# Patient Record
Sex: Male | Born: 1945 | Race: White | Hispanic: No | Marital: Married | Smoking: Former smoker
Health system: Southern US, Community
[De-identification: ages and names within clinical notes are randomized; demographics above are authoritative.]

## PROBLEM LIST (undated history)

## (undated) DIAGNOSIS — IMO0001 Reserved for inherently not codable concepts without codable children: Secondary | ICD-10-CM

## (undated) DIAGNOSIS — I10 Essential (primary) hypertension: Secondary | ICD-10-CM

## (undated) DIAGNOSIS — F4310 Post-traumatic stress disorder, unspecified: Secondary | ICD-10-CM

## (undated) DIAGNOSIS — K08109 Complete loss of teeth, unspecified cause, unspecified class: Secondary | ICD-10-CM

## (undated) DIAGNOSIS — M1990 Unspecified osteoarthritis, unspecified site: Secondary | ICD-10-CM

## (undated) DIAGNOSIS — Z974 Presence of external hearing-aid: Secondary | ICD-10-CM

## (undated) DIAGNOSIS — M1711 Unilateral primary osteoarthritis, right knee: Secondary | ICD-10-CM

## (undated) DIAGNOSIS — N289 Disorder of kidney and ureter, unspecified: Secondary | ICD-10-CM

## (undated) DIAGNOSIS — N529 Male erectile dysfunction, unspecified: Secondary | ICD-10-CM

## (undated) DIAGNOSIS — Z973 Presence of spectacles and contact lenses: Secondary | ICD-10-CM

## (undated) DIAGNOSIS — M5430 Sciatica, unspecified side: Secondary | ICD-10-CM

## (undated) DIAGNOSIS — N471 Phimosis: Secondary | ICD-10-CM

## (undated) DIAGNOSIS — N200 Calculus of kidney: Secondary | ICD-10-CM

## (undated) DIAGNOSIS — E785 Hyperlipidemia, unspecified: Secondary | ICD-10-CM

## (undated) DIAGNOSIS — Z9289 Personal history of other medical treatment: Secondary | ICD-10-CM

## (undated) DIAGNOSIS — Z8709 Personal history of other diseases of the respiratory system: Secondary | ICD-10-CM

## (undated) DIAGNOSIS — N201 Calculus of ureter: Secondary | ICD-10-CM

## (undated) DIAGNOSIS — Z87442 Personal history of urinary calculi: Secondary | ICD-10-CM

## (undated) DIAGNOSIS — E1140 Type 2 diabetes mellitus with diabetic neuropathy, unspecified: Secondary | ICD-10-CM

## (undated) DIAGNOSIS — E119 Type 2 diabetes mellitus without complications: Secondary | ICD-10-CM

## (undated) DIAGNOSIS — E1142 Type 2 diabetes mellitus with diabetic polyneuropathy: Secondary | ICD-10-CM

## (undated) DIAGNOSIS — M48062 Spinal stenosis, lumbar region with neurogenic claudication: Secondary | ICD-10-CM

## (undated) HISTORY — DX: Unspecified osteoarthritis, unspecified site: M19.90

## (undated) HISTORY — PX: BACK SURGERY: SHX140

## (undated) HISTORY — PX: OTHER SURGICAL HISTORY: SHX169

## (undated) HISTORY — PX: ROTATOR CUFF REPAIR: SHX139

## (undated) HISTORY — DX: Hyperlipidemia, unspecified: E78.5

## (undated) HISTORY — DX: Male erectile dysfunction, unspecified: N52.9

## (undated) HISTORY — DX: Calculus of kidney: N20.0

## (undated) HISTORY — PX: COLONOSCOPY: SHX174

## (undated) HISTORY — DX: Essential (primary) hypertension: I10

## (undated) HISTORY — DX: Sciatica, unspecified side: M54.30

## (undated) HISTORY — DX: Type 2 diabetes mellitus with diabetic polyneuropathy: E11.42

## (undated) HISTORY — DX: Post-traumatic stress disorder, unspecified: F43.10

---

## 1997-09-07 ENCOUNTER — Encounter: Payer: Self-pay | Admitting: Emergency Medicine

## 1997-09-07 ENCOUNTER — Emergency Department (HOSPITAL_COMMUNITY): Admission: EM | Admit: 1997-09-07 | Discharge: 1997-09-07 | Payer: Self-pay | Admitting: Emergency Medicine

## 2001-05-24 ENCOUNTER — Encounter (HOSPITAL_COMMUNITY): Admission: RE | Admit: 2001-05-24 | Discharge: 2001-06-23 | Payer: Self-pay | Admitting: Preventative Medicine

## 2001-06-10 ENCOUNTER — Encounter: Payer: Self-pay | Admitting: Preventative Medicine

## 2001-06-10 ENCOUNTER — Ambulatory Visit (HOSPITAL_COMMUNITY): Admission: RE | Admit: 2001-06-10 | Discharge: 2001-06-10 | Payer: Self-pay | Admitting: Preventative Medicine

## 2003-02-13 ENCOUNTER — Ambulatory Visit (HOSPITAL_COMMUNITY): Admission: RE | Admit: 2003-02-13 | Discharge: 2003-02-13 | Payer: Self-pay | Admitting: Family Medicine

## 2006-08-03 ENCOUNTER — Inpatient Hospital Stay (HOSPITAL_COMMUNITY): Admission: RE | Admit: 2006-08-03 | Discharge: 2006-08-05 | Payer: Self-pay | Admitting: Neurosurgery

## 2007-01-06 HISTORY — PX: VIDEO ASSISTED THORACOSCOPY (VATS)/DECORTICATION: SHX6171

## 2007-06-13 ENCOUNTER — Ambulatory Visit: Payer: Self-pay | Admitting: Cardiology

## 2007-06-13 ENCOUNTER — Inpatient Hospital Stay (HOSPITAL_COMMUNITY): Admission: AD | Admit: 2007-06-13 | Discharge: 2007-06-14 | Payer: Self-pay | Admitting: Family Medicine

## 2007-06-23 ENCOUNTER — Inpatient Hospital Stay (HOSPITAL_COMMUNITY): Admission: EM | Admit: 2007-06-23 | Discharge: 2007-07-06 | Payer: Self-pay | Admitting: Emergency Medicine

## 2007-06-24 ENCOUNTER — Encounter (INDEPENDENT_AMBULATORY_CARE_PROVIDER_SITE_OTHER): Payer: Self-pay | Admitting: Interventional Radiology

## 2007-06-27 ENCOUNTER — Ambulatory Visit: Payer: Self-pay | Admitting: Thoracic Surgery

## 2007-06-29 ENCOUNTER — Encounter: Payer: Self-pay | Admitting: Thoracic Surgery

## 2007-07-01 ENCOUNTER — Ambulatory Visit: Payer: Self-pay | Admitting: Infectious Diseases

## 2007-07-13 ENCOUNTER — Ambulatory Visit: Payer: Self-pay | Admitting: Thoracic Surgery

## 2007-07-13 ENCOUNTER — Encounter: Admission: RE | Admit: 2007-07-13 | Discharge: 2007-07-13 | Payer: Self-pay | Admitting: Thoracic Surgery

## 2007-08-03 ENCOUNTER — Ambulatory Visit: Payer: Self-pay | Admitting: Thoracic Surgery

## 2007-08-03 ENCOUNTER — Encounter: Admission: RE | Admit: 2007-08-03 | Discharge: 2007-08-03 | Payer: Self-pay | Admitting: Thoracic Surgery

## 2007-10-05 ENCOUNTER — Ambulatory Visit: Payer: Self-pay | Admitting: Thoracic Surgery

## 2007-10-05 ENCOUNTER — Encounter: Admission: RE | Admit: 2007-10-05 | Discharge: 2007-10-05 | Payer: Self-pay | Admitting: Thoracic Surgery

## 2008-07-26 IMAGING — CR DG CHEST 2V
2 series · 2 of 2 positions shown · non-contrast
Comparison: 07/28/2006

CLINICAL DATA: Dyspnea, chest pain

CHEST - 2 VIEW

[view not recorded (1 of 2)]
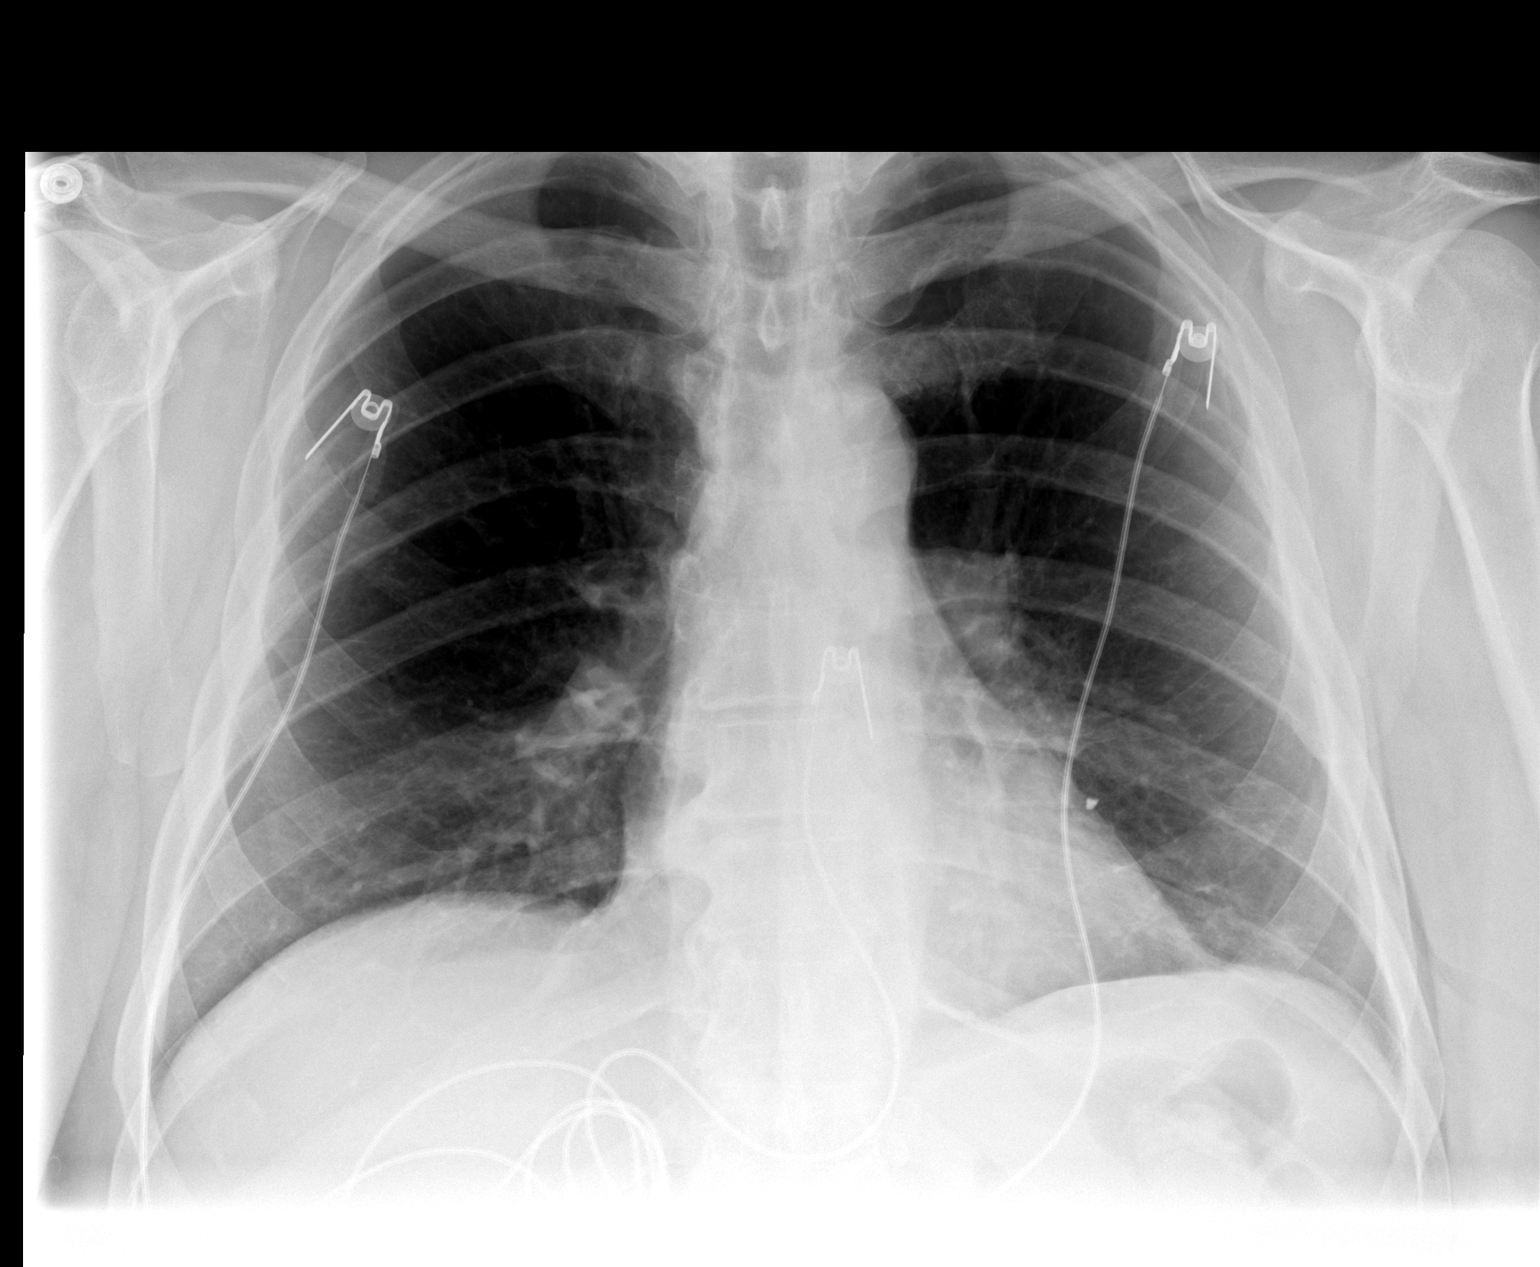

[view not recorded (2 of 2)]
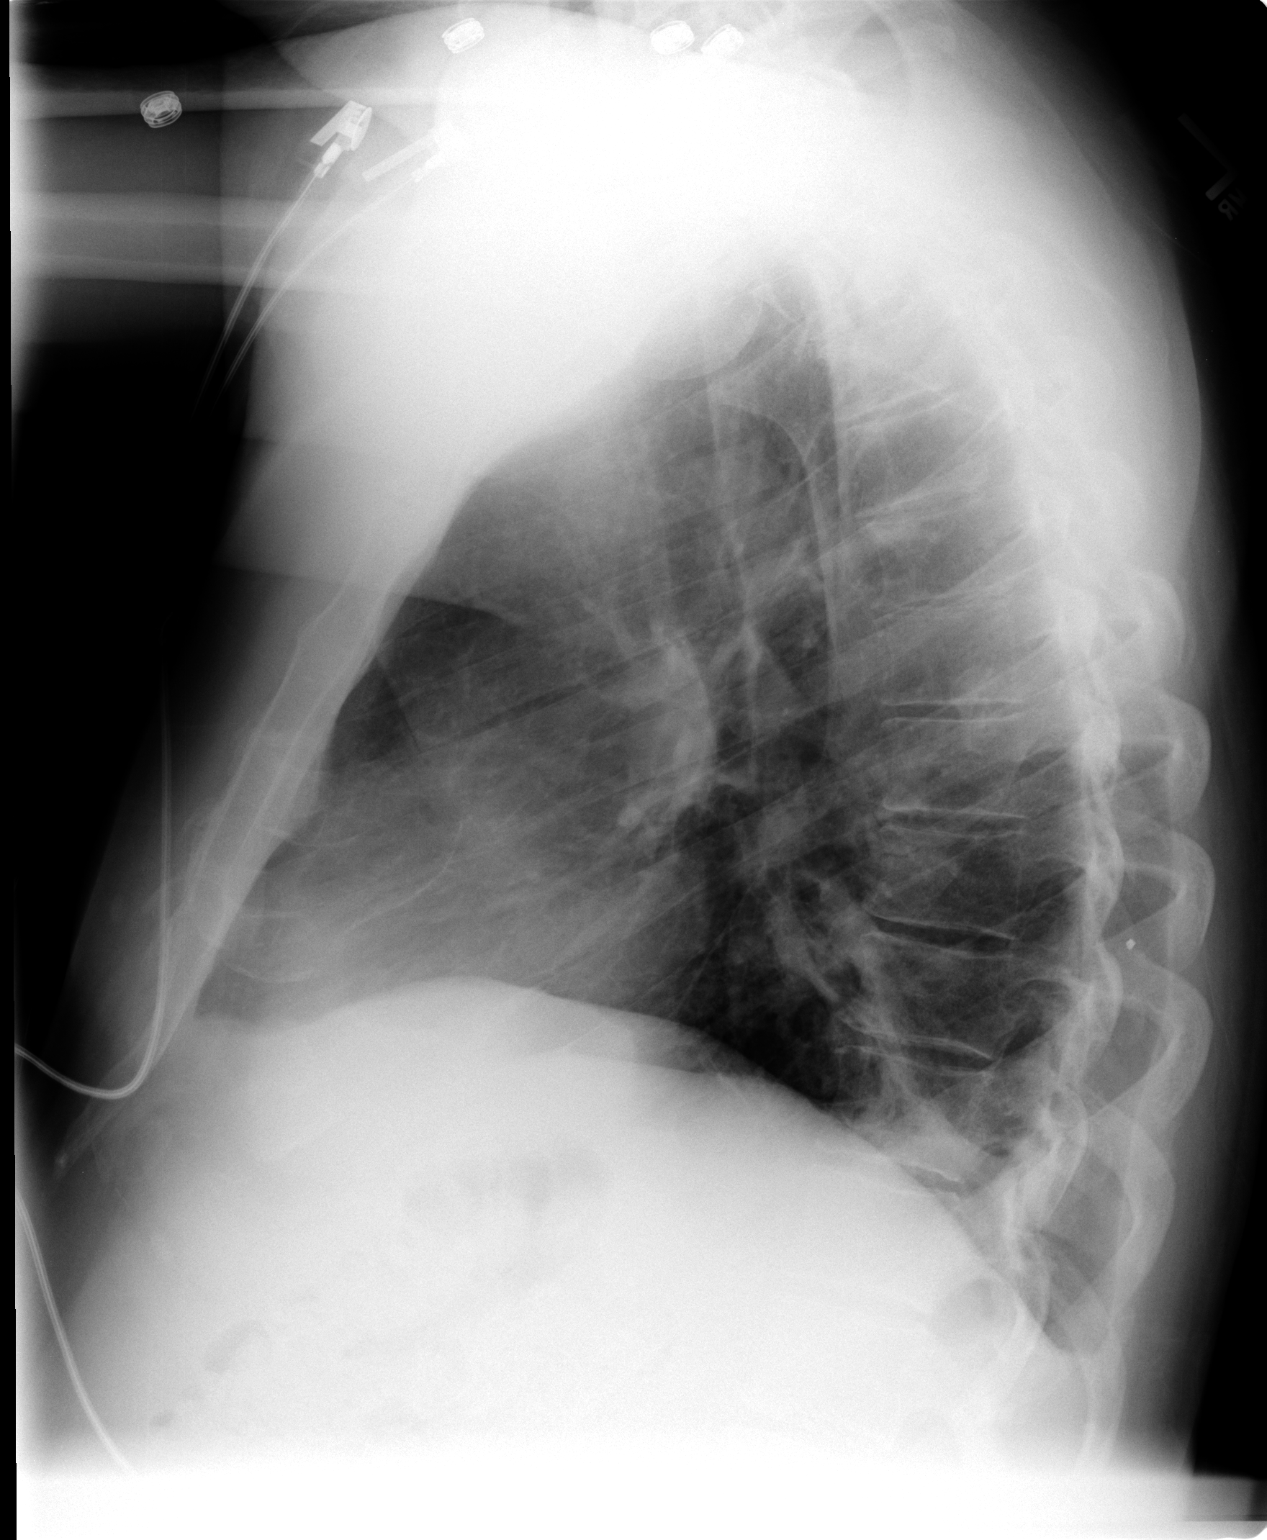

[2 of 2 positions shown; findings below may reference images not displayed]

FINDINGS: Normal heart size, mediastinal contours, pulmonary vascularity.
Slight emphysematous changes in upper lobes.
Subsegmental atelectasis left lower lobe, new since previous exam.
No definite infiltrate, pleural effusion, or pneumothorax.
Tiny metallic foreign body in posterior left chest wall stable.
IMPRESSION: Mild emphysematous changes of upper lobes with subsegmental
atelectasis left lower lobe.

## 2008-09-14 IMAGING — CR DG CHEST 2V
2 series · 2 of 2 positions shown · non-contrast
Comparison: 07/13/2007

CLINICAL DATA: Status post left VATS procedure

CHEST - 2 VIEW

[view not recorded (1 of 2)]
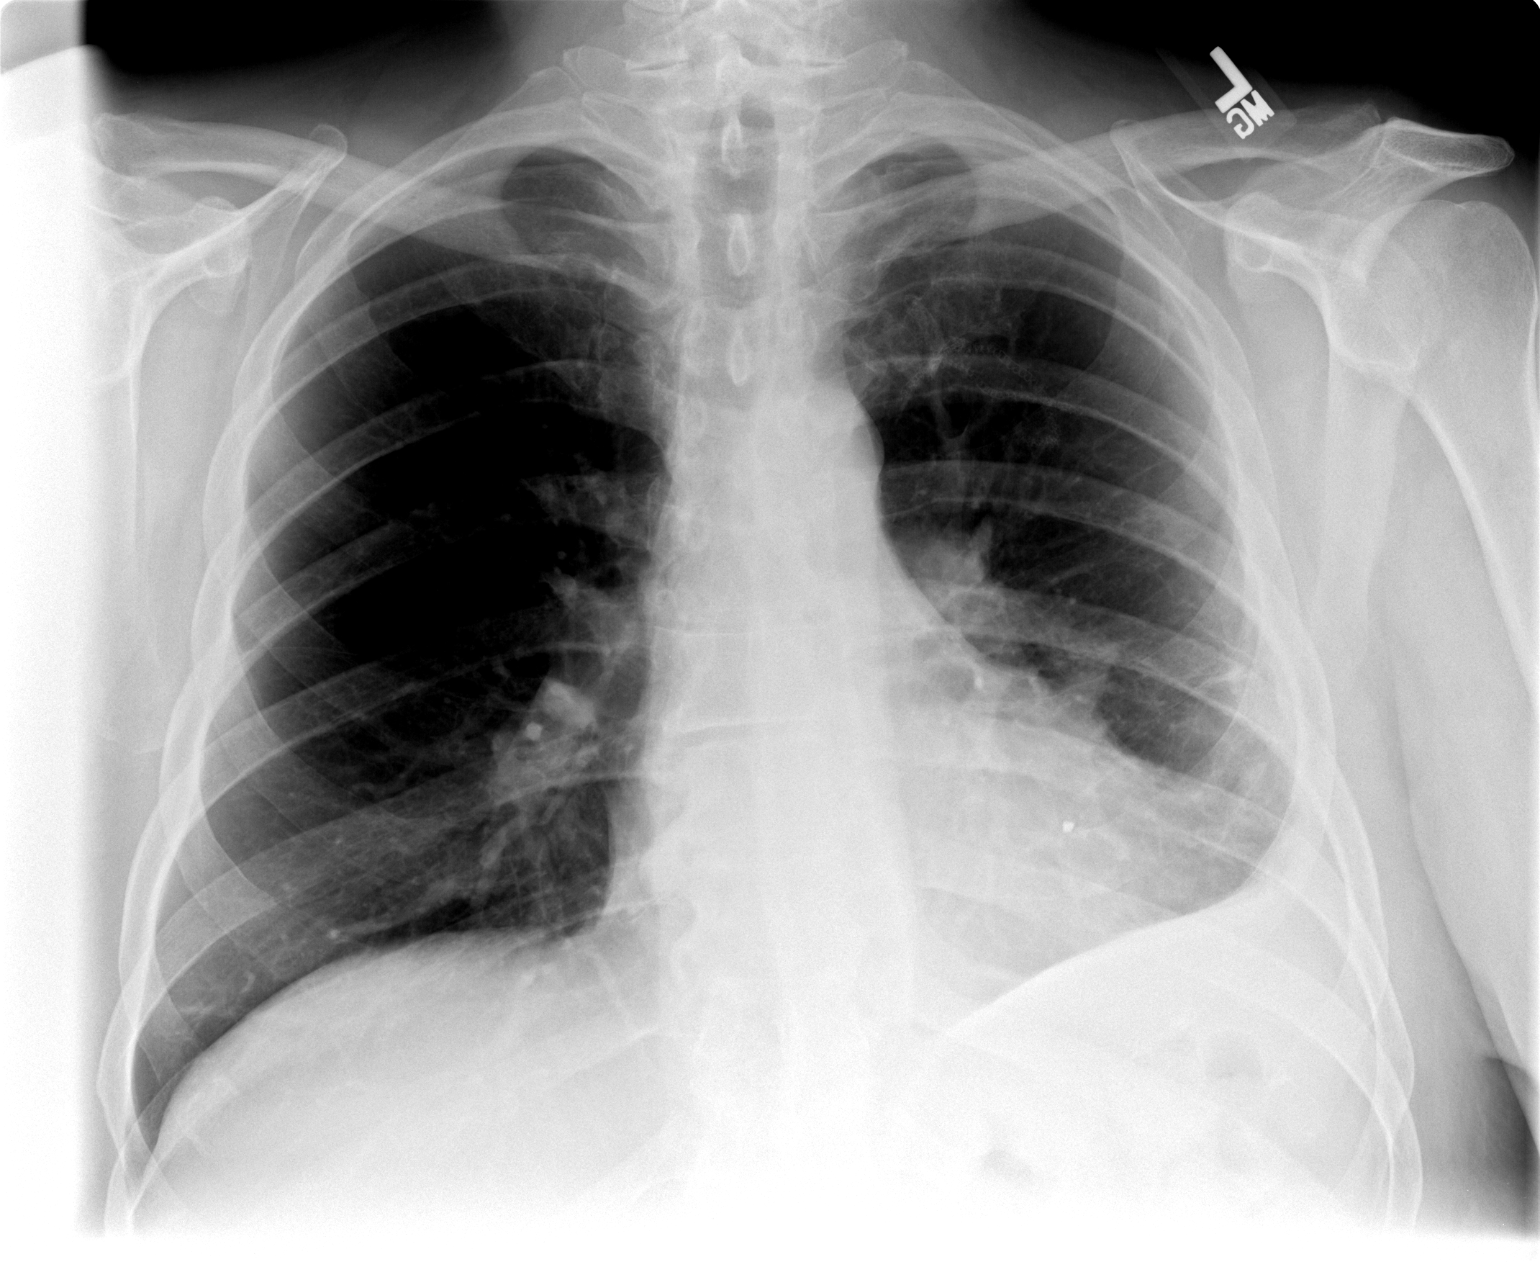

[view not recorded (2 of 2)]
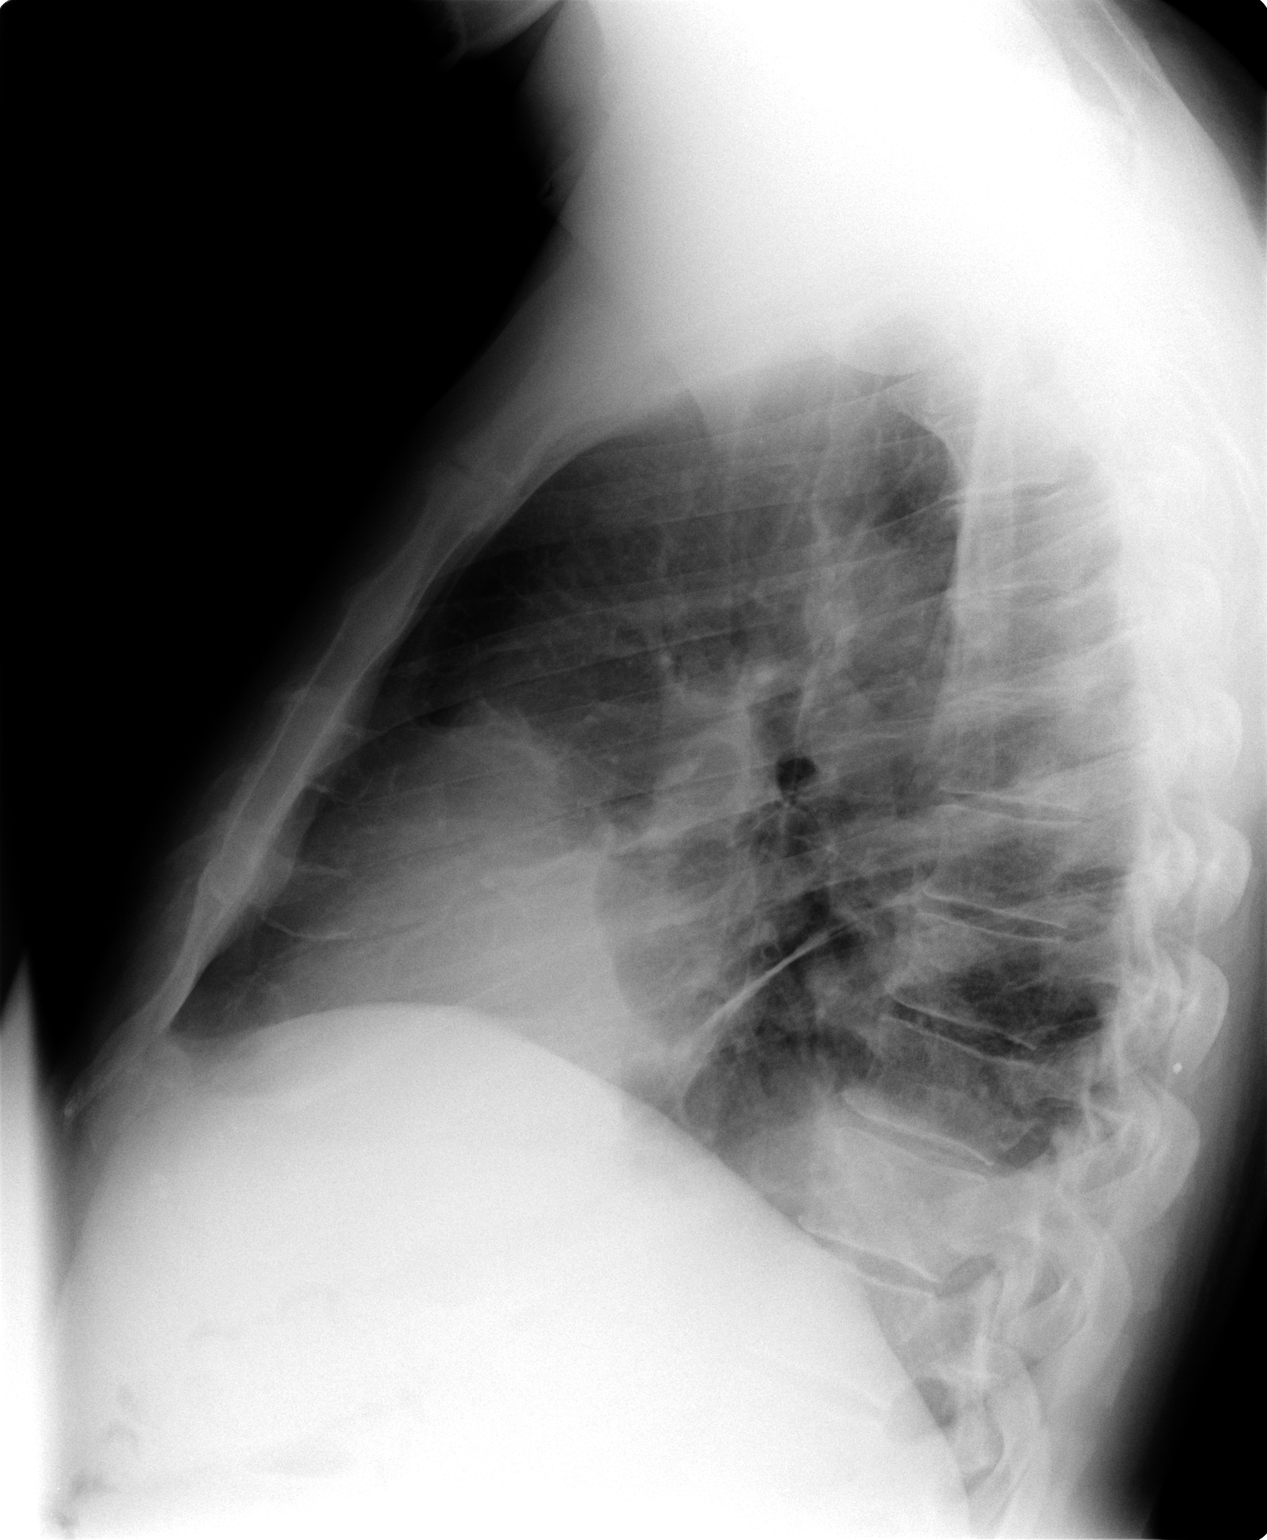

[2 of 2 positions shown; findings below may reference images not displayed]

FINDINGS: Two-view chest shows improved aeration in the left lung
base with some persistent blunting of the left costophrenic angle
and pleural parenchymal densities suggesting scarring.  The right
lung is clear.  The heart size is within normal limits. Imaged bony
structures of the thorax are intact. Tiny radiodensity projects
within the posterior left chest wall.
IMPRESSION: Interval continued improvement in aeration at the left lung base
with persistent blunting of the left costophrenic angle.

## 2008-11-23 ENCOUNTER — Encounter (HOSPITAL_COMMUNITY): Admission: RE | Admit: 2008-11-23 | Discharge: 2008-12-23 | Payer: Self-pay | Admitting: Dentistry

## 2008-12-25 ENCOUNTER — Encounter (HOSPITAL_COMMUNITY): Admission: RE | Admit: 2008-12-25 | Discharge: 2009-01-04 | Payer: Self-pay | Admitting: *Deleted

## 2009-01-08 ENCOUNTER — Encounter (HOSPITAL_COMMUNITY): Admission: RE | Admit: 2009-01-08 | Discharge: 2009-02-07 | Payer: Self-pay | Admitting: *Deleted

## 2009-02-12 ENCOUNTER — Encounter (HOSPITAL_COMMUNITY): Admission: RE | Admit: 2009-02-12 | Discharge: 2009-03-14 | Payer: Self-pay | Admitting: *Deleted

## 2010-01-26 ENCOUNTER — Encounter: Payer: Self-pay | Admitting: Thoracic Surgery

## 2010-05-20 NOTE — Discharge Summary (Signed)
NAMEAUGUSTEN, Logan Hooper NO.:  [id number]   MEDICAL RECORD NO.:  [id number]          PATIENT TYPE:  INP   LOCATION:  A310                          FACILITY:  [facility]   PHYSICIAN:  Donna Bernard, M.D.DATE OF BIRTH:  1945/12/30   DATE OF ADMISSION:  06/13/2007  DATE OF DISCHARGE:  LH                               DISCHARGE SUMMARY   FINAL DIAGNOSES:  1. Chest pain, myocardial infarction ruled out.  2. Chest wall pain.  3. Type 2 diabetes.  4. Hyperlipidemia.  5. Hypertension.   FINAL DISPOSITION:  1. The patient discharged home.  2. Discharge meds, maintain usual chronic medications plus Lodine 400      mg 1 twice per day and Vicodin ES 1 q.4-6 h. p.r.n. for pain.  3. Symptomatic care discussed.  Follow up in the office in 1 week.   INITIAL HISTORY AND PHYSICAL:  Please see H and P as dictated.   HOSPITAL COURSE:  This patient is a 65 year old white male with history  of type 2 diabetes, hypertension, and hyperlipidemia who arrived office  today of admission with complaints of chest pain it was left-sided sharp  in nature, worse with certain motions, worse when he moved his arm,  though felt to be musculoskeletal and had too many risk factors to  ignore.  He was brought in the hospital we did a serial cardiac enzymes.  These were negative.  He was given IV pain medicine and cardiology folks  were consulted.  We did an exercise stress test.  He had a little bit of  shortness of breath and no evidence of EKG, troubles on exercise.  The  patient was felt to have a negative stress test.  He did have a chest x-  ray done, which showed no element of COPD.  The patient was encouraged  to stop smoking.  On day of discharge, the patient was discharged home  with diagnosis and disposition as noted above.      Donna Bernard, M.D.  Electronically Signed     WSL/MEDQ  D:  06/23/2007  T:  06/24/2007  Job:  [id number]

## 2010-05-20 NOTE — Procedures (Signed)
NAMERHEA, THRUN NO.:  [id number]   MEDICAL RECORD NO.:  [id number]          PATIENT TYPE:  INP   LOCATION:  A310                          FACILITY:  [facility]   PHYSICIAN:  Gerrit Friends. Dietrich Pates, MD, FACCDATE OF BIRTH:  [birth year]   DATE OF PROCEDURE:  06/14/2007  DATE OF DISCHARGE:  06/14/2007                                  STRESS TEST   Graded Exercise Test.   REFERRING PHYSICIAN:  Donna Bernard, MD   CLINICAL DATA:  A 65 year old gentleman admitted to hospital with chest  discomfort.   1. Treadmill exercise performed to a workload of 9 METT and a heart      rate of 119, 75% of age - predicted maximum.  Exercise discontinued      due to dyspnea; no chest discomfort reported.  2. Blood pressure increased from a resting value of 125/75 to 160/70      during exercise and 205/70 in recovery, and minimally hypertensive      response.  3. No arrhythmias noted.  4. Baseline EKG:  Normal sinus rhythm; within normal limits.  5. Stress EKG:  Insignificant upsloping ST-segment depression.   IMPRESSION:  Negative, but slightly submaximal graded-exercise test,  failing to exacerbate the patient's baseline chest discomfort and  demonstrating no electrocardiographic evidence for ischemia.   OTHER FINDINGS:  As noted.      Gerrit Friends. Dietrich Pates, MD, [facility]  Electronically Signed     RMR/MEDQ  D:  06/14/2007  T:  06/15/2007  Job:  [id number]

## 2010-05-20 NOTE — Op Note (Signed)
NAMEBUTCH, OTTERSON NO.:  [id number]   MEDICAL RECORD NO.:  [id number]          PATIENT TYPE:  AMB   LOCATION:  SDS                          FACILITY:  [facility]   PHYSICIAN:  Payton Doughty, M.D.      DATE OF BIRTH:  December 07, 1945   DATE OF PROCEDURE:  08/03/2006  DATE OF DISCHARGE:                               OPERATIVE REPORT   PREOPERATIVE DIAGNOSIS:  Spondylosis L4-5.   POSTOPERATIVE DIAGNOSIS:  Spondylosis L4-5.   OPERATIVE PROCEDURE:  L4-5 laminotomy, foraminotomy done bilaterally.   SURGEON:  Payton Doughty, M.D.   ANESTHESIA:  General endotracheal.   PREP:  Sterile Betadine prep and scrub with alcohol wipe.   COMPLICATIONS:  None.   NURSE ASSISTANT:  [place].   DOCTOR ASSISTANT:  Hewitt Shorts, M.D.   BODY OF TEXT:  A 65 year old right-handed white gentleman with radicular  claudication, taken to operating room, smoothly anesthetized intubated,  placed prone on the operating table.  Following shave, prep, drape in  usual sterile fashion, skin was infiltrated with 1% lidocaine with  1:400,000 epinephrine.  Skin was incised from bottom L3 to middle L5 and  the lamina of L4 was exposed bilaterally in subperiosteal plane.  Intraoperative x-ray confirmed correctness level, having confirmed  correctness level hemisemilaminectomy of L5 was carried out bilaterally  to the top of ligamentum flavum.  It was removed in retrograde fashion,  L5 was undercut bilaterally and this allowed exposure of the 4 and 5  roots as they approached their respective neural foramina.  The right  side there was minor compression.  On the left side there was fairly  significant compression that was relieved by bony removal as well as  removal of ligamentum flavum.  Following complete decompression, the  neural foramen were explored and found to be open.  Wound was irrigated.  Hemostasis assured.  The laminectomy defects were filled with Depo-  Medrol soaked fat.  Successive  layers of 0-0 Vicryl, 2-0 Vicryl, 3-0  nylon were used to close.  Betadine Telfa dressing was applied and made  occlusive with OpSite and the patient returned recovery room in good  condition.           ______________________________  Payton Doughty, M.D.     MWR/MEDQ  D:  08/03/2006  T:  08/04/2006  Job:  [id number]

## 2010-05-20 NOTE — H&P (Signed)
[name], Hooper NO.:  [id number]   MEDICAL RECORD NO.:  [id number]          PATIENT TYPE:  OIB   LOCATION:  3312                         FACILITY:  [facility]   PHYSICIAN:  Payton Doughty, M.D.      DATE OF BIRTH:  1945-03-31   DATE OF ADMISSION:  08/03/2006  DATE OF DISCHARGE:                              HISTORY & PHYSICAL   ADMITTING DIAGNOSIS:  Spondylosis L19-77   A 65 year old right-handed white gentleman who has had pain in his back  for a number of years, getting worse, started getting down in his left  leg, difficult for him to get about.  MR shows severe spinal stenosis at  the L4-5.  He was admitted for laminotomy, foraminotomy.   MEDICAL HISTORY:  Remarkable for:  1. Prostate and kidney stones.  2. In 1996 he had a resection of his kidney stones in his right      kidney.  3. In 2002 he had a TURP.  4. He also has type 2 diabetes.   MEDICATIONS:  1. Glyburide 10 mg twice a day.  2. Simvastatin 80 mg at bedtime.  3. Vicodin on a p.r.n. basis.  4. Rosiglitazone 8 mg a day for diabetes.  5. Metformin 1000 mg twice a day for diabetes.  6. Multivitamin.  7. Lisinopril 40 mg a day.  8. Vitamin B12.  9. Omeprazole 20 mg a day.  10.An aspirin a day.   HE GETS SICK WITH CODEINE.   SOCIAL HISTORY:  Does not smoke, very light social drinker, and he is a  heavy Runner, broadcasting/film/video for textiles.   FAMILY HISTORY:  Parents are deceased.  History is not really given.   REVIEW OF SYSTEMS:  Remarkable for glasses, nasal congestion, chest  pain, hypertension, leg pain, leg weakness, back pain, arthritis and  diabetes.   Cardiac workup was modest cardiac risk for his operation.   HEENT EXAM:  Normal limits.  NECK:  He has good range of motion of his neck.  CHEST:  Clear.  CARDIAC EXAM:  Regular rate and rhythm.  ABDOMEN:  Large, but nontender with no hepatosplenomegaly.  EXTREMITIES:  Without clubbing or cyanosis.  GU EXAM:  Deferred.  Peripheral   pulses are good.  NEUROLOGICALLY:  He is awake, alert and oriented.  His cranial nerves  are intact.  Motor exam shows 5/5 strength throughout the upper and  lower extremities.  Reflexes are 2 at the right knee, 1 at the left  knee, 1 at the right ankle, __________ at the left ankle.  Straight leg  raise is positive on the left.   MR demonstrates severe spinal stenosis at L4-5; there is 2 mm of  sloughing, no instability.   CLINICAL IMPRESSION:  Lumbar spondylosis with radicular claudication.   Plan is for bilateral laminotomy and foraminotomy at L4-5.  The risks  and benefits have been discussed with him and he wishes to proceed.           ______________________________  Payton Doughty, M.D.     MWR/MEDQ  D:  08/03/2006  T:  08/03/2006  Job:  [id number]

## 2010-05-20 NOTE — H&P (Signed)
Logan Hooper, Logan Hooper NO.:  [id number]   MEDICAL RECORD NO.:  [id number]          PATIENT TYPE:  INP   LOCATION:  A310                          FACILITY:  [facility]   PHYSICIAN:  Donna Bernard, M.D.DATE OF BIRTH:  07/13/1945   DATE OF ADMISSION:  06/13/2007  DATE OF DISCHARGE:  LH                              HISTORY & PHYSICAL   CHIEF COMPLAINT:  Chest pain.   SUBJECTIVE:  This patient is a 65 year old white male with a history of  type 2 diabetes, hypertension, hyperlipidemia who presents to the office  on the day of admission with complaints of chest pain.  Of note, we have  not seen him for over 4 years.  He receives all his care now through the  [facility]  The patient notes the pain came on several days ago, left-  sided in nature, sharp at times, aching at times.  Positive association  with diaphoresis.  Pain is worse with a deep breath, worse with certain  motions.  The patient feels that it is something more significant than  musculoskeletal pain.   He claims compliance with all his chronic medications which include:  1. Metformin 1000 mg p.o. b.i.d.  2. Simvastatin 80 mg p.o. nightly.  3. Losartan 25 mg p.o. q.a.m.  4. Glyburide 5 mg two p.o. b.i.d.  5. Actos 45 mg nightly.  6. Lisinopril 40 mg q.a.m.  7. Aspirin 81 mg q.a.m.   PRIOR SURGERIES:  1. Kidney stone surgery in 1990.  2. Prostate hypertrophy surgery with TURP around 1990 and, again, in      2004.  3. Lumbar disk surgery in 2008.   FAMILY HISTORY:  Positive hypertension, type 2 diabetes, coronary artery  disease in mother.   ALLERGIES:  None known.  LIPITOR muscle cramps.   SOCIAL HISTORY:  Patient is married, two children.  Minimal alcohol use.  Smokes a cigar on occasion.   REVIEW OF SYSTEMS:  Otherwise negative.   PHYSICAL EXAMINATION:  VITAL SIGNS: BP 140/80 on repeat.  Alert in no  acute distress, afebrile.  HEENT: Normal.  NECK:  Supple.  LUNGS:  Clear, some pain with  rotation of the left shoulder.  Some pain  with deep palpation to the superior chest.  ABDOMEN:  Benign.  EXTREMITIES:  Normal.  NEUROLOGIC:  Intact.   EKG:  Normal sinus rhythm.  No significant ST-T changes.   IMPRESSION:  Chest pain, potentially unstable angina.  This is likely  musculoskeletal, but the patient has a tremendous number of risk  factors.  With the severity of pain day-and-night for the last several  days progressive in nature, I feel inpatient workup is definitely  warranted.   PLAN:  Admit for serial cardiac enzymes, cardiology consult, telemetry,  pain control.  Further orders as noted on the chart.      Donna Bernard, M.D.  Electronically Signed     WSL/MEDQ  D:  06/14/2007  T:  06/14/2007  Job:  [id number]

## 2010-05-20 NOTE — Op Note (Signed)
NAMEKOSEI, RHODES NO.:  [id number]   MEDICAL RECORD NO.:  [id number]          PATIENT TYPE:  INP   LOCATION:  2306                         FACILITY:  [facility]   PHYSICIAN:  Ines Bloomer, M.D. DATE OF BIRTH:  [birth year]   DATE OF PROCEDURE:  06/29/2007  DATE OF DISCHARGE:                               OPERATIVE REPORT   PREOPERATIVE DIAGNOSIS:  Empyema, left chest.   POSTOPERATIVE DIAGNOSIS:  Empyema, left chest.   OPERATION FORMED:  1. Left video-assisted thoracic surgery decortication.  2. Drainage of empyema with decortication.  3. Minithoracotomy.   SURGEON:  Ines Bloomer, MD.   ANESTHESIA:  General anesthesia.   After percutaneous insertion of all monitor lines, the patient underwent  general anesthesia.  He was turned to the left in the lateral  thoracotomy position and dual-lumen tube had been inserted.  The patient  was prepped and draped in the usual sterile manner.  Two trocar sites  were made in the anterior and posterior axillary line and seventh  intercostal space.  Two trocars were inserted and a 0-degree scope was  inserted, and the patient had an obviously empyema, pictures were taken.  Coming in through the posterior trocar sites, we freed up the anterior  portion of the lower lobe and part of the lingula of the upper lobe.  This has allowed [place] to do about an 8-cm incision anteriorly, splitting  the serratus and entering the sixth intercostal space.  __________ was  placed in the space.  We then freed up the left lower lobe off the  diaphragm, removing a lot of exudate.  We sent exudate for culture and  fluid for culture.  We then dissected medially off the pericardium and  the area that they followed on the CT scan was a necrotic tumor, was  really just being necrotic area of loculated empyema.  The lingula and  the medial basilar segments of the left lower lobe were both freed up  off the heart and there was no palpable lesions  there, although there is  a lot of exudate.  We freed the left upper lobe, up off the mediastinum  as best we could.  I then turned our attention posteriorly, freeing up  the left upper lobe up to the apex.  There was a large empyema  posteriorly and we debrided that out doing a pleurectomy in that area of  the necrotic pleura.  After all the parietal pleural had been debrided  and the diaphragm has been debrided, we then turned our attention to the  left lower lobe and did a decortication of the left lower lobe debriding  off the exudate with sharp and blunt dissection using ring forceps then  I attempted to strip off the exudate on the visceral pleura.  The  fissure was opened and again the decortication was done out of this  fissure, taking out the exudate.  In the superior portion, the fissure  had to be divided with the Auto Suture 60 stapler and then we removed  more of the exudate from the superior segment.  Attention was then  turned to the posterior segment of the left upper lobe and we were able  to remove all the exudate of that and then anteriorly.  After all  exudate had been removed, the area was irrigated copiously.  Several  tears in the left upper lobe were oversewn with 3-0 Vicryl.  The chest  was closed and now three chest tubes were placed a right-angle chest  tube between the other two trocar sites and that was through a separate  incision and sutured in place with 0 silk and then the anterior and  posterior were straight #36 chest tube sutured in place with 0 silk and  was placed through the trocar sites.  Marcaine block was done in the  usual fashion.  A single On-Q inserted in the usual fashion.  Chest was  closed  with three pericostals drilling through the seventh rib and passed  around the fifth rib, #1 Vicryl in the muscle layer, 2-0 Vicryl in  subcutaneous tissue, and Dermabond for the skin.  The patient was  returned to the recovery room in serious  condition.       Ines Bloomer, M.D.  Electronically Signed     DPB/MEDQ  D:  06/29/2007  T:  06/30/2007  Job:  [id number]

## 2010-05-20 NOTE — Consult Note (Signed)
NAMEBETHEL, Logan Hooper NO.:  [id number]   MEDICAL RECORD NO.:  [id number]          PATIENT TYPE:  INP   LOCATION:  A310                          FACILITY:  [facility]   PHYSICIAN:  Gerrit Friends. Dietrich Pates, MD, FACCDATE OF BIRTH:  1945/07/07   DATE OF CONSULTATION:  DATE OF DISCHARGE:  06/14/2007                                 CONSULTATION   REFERRING DOCTOR:  Donna Bernard, M.D   PRIMARY CARDIOLOGISTS:  [facility].  He is seen  at the [facility].   HISTORY OF PRESENT ILLNESS:  A 65 year old gentleman admitted to  hospital with chest pain.  Logan Hooper has no significant history of  cardiovascular disease, but does have multiple cardiovascular risk  factors including diabetes, hypertension, hyperlipidemia, and the use of  tobacco products.  He presents with approximately a 4-day history of  constant left back and chest discomfort.  There was initially radiation  down the left arm, but this has resolved.  There has been no associated  dyspnea, diaphoresis, no nausea.  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  His discomfort has some pleuritic component and is  moderately severe.  There is increased discomfort with rotation of the  trunk or use of the left arm.  He had a stress nuclear study 1 year ago  at the [facility] for unclear indications, which was reportedly  negative.   PAST MEDICAL HISTORY:  Notable for nephrolithiasis with surgical  intervention in 1990.  He required a TURP in 1990 and again in 2004 for  BPH.  He underwent neurosurgery for spinal stenosis at [facility] last year.   CURRENT MEDICATIONS:  Include,  1. Metformin 1000 mg b.i.d.  2. Simvastatin 80 mg daily.  3. Losartan 25 mg daily.  4. Glyburide 10 mg b.i.d.  5. Actos 45 mg daily.  6. Lisinopril 40 mg daily.  7. Aspirin 81 mg daily.   He has no true drug allergies, but  did develop myalgias when taking  atorvastatin.   SOCIAL HISTORY:  Married with 2 children; no excessive use of alcohol;  smokes cigars.   FAMILY HISTORY:  Hypertension and diabetes.  His mother has coronary  artery disease.   REVIEW OF SYSTEMS:  The patient reports arthritic discomfort and chronic  back pain.  He follows a regular diet.  All other systems reviewed and  are negative.   PHYSICAL EXAMINATION:  GENERAL:  On exam, pleasant trim gentleman in no  acute distress.  VITAL SIGNS:  The temperature is 98.6, heart rate 65 and regular,  respirations 18, blood pressure 155/80, O2 saturation 97% on room air,  CBG 282, weight 98 kg, and height 72 inches.  HEENT:  Anicteric sclerae; normal lids and conjunctivae; normal oral  mucosa.  NECK:  No jugular venous distention; normal carotid upstrokes without  bruits.  LUNGS:  Clear.  CARDIAC:  Normal first and second heart sounds.  ABDOMEN:  Soft and nontender; no organomegaly.  EXTREMITIES:  No edema; normal distal pulses.  Increased discomfort with  movement of the left arm, either passive or active.  There is tenderness  along the left trapezius ridge and somewhat inferior to that in the  muscles of the upper back.  NEUROLOGICAL:  Symmetric strength and tone; normal cranial nerves.  SKIN:  No significant lesions.   EKG:  Normal sinus rhythm; nondiagnostic anterolateral Q-waves; somewhat  prominent but still nondiagnostic inferior Q-waves; minimal voltage  criteria for LVH.   Laboratory otherwise notable for a normal chemistry profile except for  glucose of 178, total protein of 5.8, and albumin of 3.3.  Cardiac  markers are negative.  No chest x-ray or other radiographic reports  available.   IMPRESSION:  Logan Hooper presents with atypical chest discomfort that is  highly suggestive of a musculoskeletal etiology.  We will start naproxen  at a dose of 375 mg t.i.d.  He does not require full anticoagulation.  He does not require  additional cardiac medication.  A stress  echocardiogram is planned for the morning in light of his multiple  cardiovascular risk factors.      Gerrit Friends. Dietrich Pates, MD, [facility]  Electronically Signed     RMR/MEDQ  D:  06/14/2007  T:  06/15/2007  Job:  [id number]

## 2010-05-20 NOTE — Letter (Signed)
October 05, 2007   W. Simone Curia, MD  [address]  [place]  [place] [address]   Re:  Logan Hooper, Logan Hooper                DOB:  [birth year]   Dear Dr. Gerda Diss:   I saw the patient back today.  His chest x-ray showed no evidence of  recurrence of his empyema.  He is doing well overall.  He had a CT scan  done at the [place], for which I do not have the report, but it does not to  me show any evidence of any cancer.  He is doing well overall.  His  blood pressure was 185/87, pulse 55, respirations 18, sats were 97%.  I  will see him back again in 2 months.  I will be happy to see him again  if he has any future problems.   Ines Bloomer, M.D.  Electronically Signed   DPB/MEDQ  D:  10/05/2007  T:  10/06/2007  Job:  [id number]

## 2010-05-20 NOTE — H&P (Signed)
Logan Hooper, KRAEMER NO.:  [id number]   MEDICAL RECORD NO.:  [id number]          PATIENT TYPE:  INP   LOCATION:  0107                         FACILITY:  [facility]   PHYSICIAN:  Hillery Aldo, M.D.   DATE OF BIRTH:  [birth year]   DATE OF ADMISSION:  06/23/2007  DATE OF DISCHARGE:                              HISTORY & PHYSICAL   PRIMARY CARE PHYSICIAN:  Donna Bernard, M.D.   CHIEF COMPLAINT:  Left chest, side and upper abdominal pain.   HISTORY OF PRESENT ILLNESS:  The patient is a 65 year old male with a 2-  week history of chest discomfort and left upper quadrant abdominal pain.  He actually was evaluated by his primary care physician and sent to a  cardiologist to rule out underlying coronary disease.  The patient  underwent stress testing on June 14, 2007, which was negative for  ischemia.  Over the course of the past 24 hours, the pain has worsened  and is now accompanied by a mainly nonproductive cough.  At times, he  does have greenish sputum production.  Pain is worse with deep  inspiration and rates a 7/10.  It was also worsened with cough and  movement.  He has had some chills but no frank fevers.  He has been  diaphoretic at times.  He has been short of breath.  Upon initial  evaluation in the emergency department, he is found to have an elevated  D-dimer and a left lung base dense opacification and subsequently  underwent CT scanning of his chest to rule out pulmonary embolism.  This  exam did reveal a 4-cm mass in the left lung base suspicious for a  necrotic lung tumor and a partially-loculated left pleural effusion.  The patient is therefore being admitted for further evaluation, workup  and treatment of his pneumonia.   PAST MEDICAL HISTORY:  1. Hypertension.  2. Diabetes.  3. Nephrolithiasis, status post stone extraction.  4. Hyperlipidemia.  5. Benign prostatic hypertrophy, status post transurethral resection      of the prostate x2.  6.  Spinal stenosis, status post L4-5 laminotomy and foraminotomy in      July 2008.  7. Stress test on June 14, 2007, negative for ischemia.   FAMILY HISTORY:  The patient's mother died in her 30s in her sleep but  it was suspected that she died of a heart-related issue.  She had  underlying coronary artery disease and diabetes.  The patient's father  died in his 43s from complications of Alzheimer disease.  The patient  has two healthy sisters.  He has one brother who has had skin cancer.  Another brother died with throat cancer.  He has two healthy offspring.   SOCIAL HISTORY:  The patient is married and lives in [place] with his  wife.  He smokes cigars.  There is a remote history of cigarette use but  none significant.  He drinks about a six-pack of beer once a year.  He  is employed as a Production designer, theatre/television/film.   ALLERGIES:  No known drug  allergies.  However, Lipitor for does cause  muscle cramps and he is intolerant to codeine.   MEDICATIONS:  1. Vicodin p.r.n.  2. Percocet p.r.n.  3. Simvastatin 40 mg daily.  4. Lisinopril 40 mg daily.  5. Glyburide 5 mg b.i.d.  6. Aspirin 81 mg daily.  7. Pioglitazone 45 mg daily.  8. Losartan 25 mg daily.  9. Metformin 1000 mg b.i.d.   REVIEW OF SYSTEMS:  The patient denies any hemoptysis.  There has not  been any nausea or vomiting.  There has not been syncope, palpitations,  sick contacts or recent travel.  He has not been losing weight.   PHYSICAL EXAM:  Temperature 98.7, pulse 84, respirations 22, blood  pressure 163/75, O2 saturation 94% on 2 L.  GENERAL:  This is an obese male who is in no acute distress.  HEENT:  Normocephalic, atraumatic.  PERRL.  EOMI.  Oropharynx is clear.  Poor dentition.  NECK:  Supple, no thyromegaly, no lymphadenopathy, no jugular venous  distention.  CHEST:  Markedly decreased breath sounds at the bases of the left lung.  Faint crackles on the right.  HEART:  Regular rate and  rhythm.  No murmurs, rubs or gallops.  ABDOMEN:  Soft, nontender, nondistended, with normoactive bowel sounds.  EXTREMITIES:  No clubbing, edema or cyanosis.  SKIN:  Warm  and dry.  No rashes.  NEUROLOGIC:  The patient is alert and oriented x3.  Cranial nerves II-  XII are grossly intact.  Nonfocal.   DATA REVIEW:  Chest x-ray shows a left lung base dense opacification  with a possible subpulmonic effusion.   CT angiogram of the chest is negative for pulmonary embolism.  There is  bilateral lower lobe atelectasis versus pneumonia, left greater than  right.  There is a 4-cm mass at the left lung base suspicious for  necrotic lung tumor and a partially-loculated left pleural effusion.   LABORATORY DATA:  White blood cell count is 24.8, hemoglobin 13.4,  hematocrit 39.4, platelets 356.  D-dimer is 1.63.   ASSESSMENT/PLAN:  1. Left lower lobe pneumonia/4-cm necrotic mass:  We will admit the      patient and obtain a percutaneous biopsy of the mass as well as      percutaneous drainage of the partially-loculated left-sided pleural      effusion.  We will send the material drained from this for Gram      stain and culture.  We will also get two sets of blood cultures as      well as sputum cultures.  Given that this is a necrotic process, I      will start him empirically on Zosyn.  The patient has a very brisk      leukocytosis, and will monitor him closely for worsening infection.  2. Hypertension:  The patient's blood pressure is slightly elevated.      At this point I would continue his routine medications but check      his renal function to ensure that he will tolerate his ACE      inhibitor and ARB without compromising any kidney function problem.  3. Diabetes:  The patient's glycemic control in the hospital ER has      been poor.  We will check a hemoglobin A1c to determine his overall      glycemic control.  We will hold his metformin given that he was      given a dye load for  the CT angiogram.  We will put him on 20 units      of Lantus daily along with insulin-resistant sliding scale.  4. Dyslipidemia:  We will continue the patient's treatment with      simvastatin and check his liver function studies.  5. Prophylaxis:  Initiate PAS hoses for DVT prophylaxis until he has      his percutaneous biopsy and drainage.  After that, would start      Lovenox.  In the meantime, we will use PAS hoses.  The patient does      not have any indication at this time for stress ulcer prophylaxis.      Hillery Aldo, M.D.  Electronically Signed     CR/MEDQ  D:  06/23/2007  T:  06/23/2007  Job:  [id number]   cc:   Donna Bernard, M.D.  Fax: [phone]

## 2010-05-20 NOTE — Consult Note (Signed)
Logan Hooper, Logan Hooper NO.:  [id number]   MEDICAL RECORD NO.:  [id number]          PATIENT TYPE:   LOCATION:                                 FACILITY:   PHYSICIAN:  Ines Bloomer, M.D.      DATE OF BIRTH:   DATE OF CONSULTATION:  DATE OF DISCHARGE:                                 CONSULTATION   CHIEF COMPLAINT:  Left chest pain.   HISTORY OF PRESENT ILLNESS:  The patient has had at least a 2-week  history of left chest pain, here at chronic evaluation which was  negative.  This pain has continued to be worse.  Chest x-rays shows a  loculated effusion with some chills and diaphoresis with some chest x-  ray on admission.  CT scan showed a 4-cm left upper lobe mass that  appears to be loculated effusion or abscess.  The radiology felt this  was necrotic tumor, but it does not appear to be the situation.   PAST MEDICAL HISTORY:  Significant for hypertension, diabetes mellitus  type 2, nephrolithiasis, BPH related TUR x2, laminectomy on June 14, 2006.   SOCIAL HISTORY:  He smokes cigars, and occasional alcohol intake.  He  works with heavy machinery.   FAMILY HISTORY:  Positive for cardiac disease and positive for cancer.   ALLERGIES:  No allergies.   MEDICATIONS:  1. Lovenox.  2. Senokot.  3. Zocor.  4. Lisinopril.  5. Diabeta.  6. Actos.  7. Losartan.  8. Insulin.  9. Atrovent.  10.Lantus.  11.Zosyn.  12.Phenergan.  13.Ambien.  14.Vicodin.  15.Percocet p.r.n.   REVIEW OF SYSTEMS:  CARDIAC:  No angina, atrial fibrillation.  PULMONARY:  See history of present illness.  No hemoptysis, GU:  See,  past medical history.  GI:  No nausea, vomiting, constipation, or  diarrhea.  MUSCULOSKELETAL:  Some arthritis.  IMMUNOLOGICAL:  No  problems with anemia, clotting disorders, or bleeding.  NEUROLOGICAL:  No dizziness, headaches, blackouts, or seizures.  VASCULAR:  No  claudication, DVT, or TIAs.  PSYCHIATRIC:  No psychiatric illnesses.   PHYSICAL  EXAMINATION:  VITAL SIGNS:  Temperature is 99, pulse is 90,  respirations are 22, and blood pressure is 150/80.  GENERAL:  He is a Caucasian white male, in no acute distress with  oxygen.  HEAD, EYES, EARS, NOSE, AND THROAT:  Unremarkable.  NECK:  Decreased breath sounds at the left base.  HEART:  Regular sinus rhythm.  No murmurs.  ABDOMEN:  Soft.  No hepatosplenomegaly.  EXTREMITIES:  Pulses 2+.  There is no clubbing or edema.  NEUROLOGICAL:  He is oriented x3.  Sensory and motor intact.  Cranial  nerves intact.   IMPRESSION:  1. Empyema with probable left lung abscess, left upper lobe.  2. Left lower lobe pneumonia.  3. Hypertension.  4. Hyperlipidemia.  5. Diabetes mellitus type 2.  6. Prostatic hypertrophy.   PLAN:  Left side decortication.      Ines Bloomer, M.D.  Electronically Signed     DPB/MEDQ  D:  06/27/2007  T:  06/28/2007  Job:  [id number] 

## 2010-05-20 NOTE — Letter (Signed)
July 13, 2007   W. Simone Curia, MD  [address]  [place], [place] [address].   Re:  Logan Hooper, Logan Hooper                DOB:  11/18/1945   Dear Brett Canales,   I saw the patient back for followup of his empyema.  His chest x-ray  shows some atelectasis and small reaction on the left side, but he is  doing much better.  Still has a small cough and some shortness of  breath.  I told him to gradually increase his activities, and finish up  his another week of supply of antibiotics.  Overall though, he is doing  well, and I removed the chest tube sutures.  His blood pressure is  149/83, pulse 70, respirations 18, and sats were 97%.  I will see him  back again in 3 weeks with a chest x-ray, and I gave him slip to remain  off work until September 06, 2007.   Ines Bloomer, M.D.  Electronically Signed   DPB/MEDQ  D:  07/13/2007  T:  07/13/2007  Job:  [id number]

## 2010-05-20 NOTE — Assessment & Plan Note (Signed)
OFFICE VISIT   Logan Hooper, Mitchell M  DOB:  [birth year]                                        August 03, 2007  CHART #:  [id number]   The patient came for followup for his empyema.  His chest x-ray shows  some blunting of the left costophrenic angle.  His incisions are well  healed.  He is feeling well.  We told him he could gradually increase  his activities and get back to activities as a golf.  He should return  to work on September 1st.  We gave him a slip for this.  I plan to see  him back again in 2 months with the chest x-ray.  His blood pressure was  168/80, pulse 58, respirations 18, and sats were 96%.   Ines Bloomer, M.D.  Electronically Signed   DPB/MEDQ  D:  08/03/2007  T:  08/03/2007  Job:  [id number]

## 2010-05-20 NOTE — Discharge Summary (Signed)
Logan Hooper, Logan Hooper                ACCOUNT NO.:  [id number]   MEDICAL RECORD NO.:  [id number]          PATIENT TYPE:  INP   LOCATION:  1340                         [facility]ACILITY:  [facility]   PHYSICIAN:  Ines Bloomer, M.D. DATE OF BIRTH:  [birth year]   DATE OF ADMISSION:  06/23/2007  DATE OF DISCHARGE:  06/28/2007                               DISCHARGE SUMMARY   ADMITTING DIAGNOSES:  1. Left lower lobe pneumonia with 4-cm necrotic mass and partially      loculated left pleural effusion.  2. Empyema.  3. History of hypertension.  4. History of diabetes mellitus.  5. History of hyperlipidemia.   DISCHARGE DIAGNOSES:  1. Left lower lobe pneumonia with 4-cm necrotic mass and partially      loculated left pleural effusion.  2. Empyema.  3. History of hypertension.  4. History of diabetes mellitus.  5. History of hyperlipidemia.   PROCEDURES:  1. Left pleuracentesis at [facility] on June 24, 2006.  2. Left VATS, left minithoracotomy, decortication of the left lung and      on-Q placement by Dr. Edwyna Shell on June 29, 2007.   HOSPITAL COURSE STAY:  This is a 65 year old Caucasian male that  according to medical records originally presented to [facility] with a 2-week history of chest discomfort and left upper  quadrant abdominal pain.  He was originally evaluated by his primary  care physician, Dr. Gerda Diss, he was then sent to a cardiologist to rule  out coronary artery disease.  The patient underwent a stress test June 14, 2007 that was negative for ischemia.  24 hours prior to his admission  to [facility], his pain had worsened and was also come with a  nonproductive cough.  The patient also stated at times he also had some  greenish sputum production.  His pain seemed to worsen with deep  inspiration as well as cough and movements.  He was initially found to  have an elevated D-dimer and a left lung base dense opacification on  chest x-ray.  He subsequently  underwent a CT of the chest to rule out  pulmonary embolus.  The CT was negative for pulmonary embolus.  However,  he was found to have bilateral lower lobe atelectatic or pneumonic  density, left greater than right.  There was also a 4-cm mass in the  left anterior lung base suspicious for narcotic lung tumor.  There is  also a left pleural effusion partially loculated.  The patient was  originally placed on Zosyn.  Cultures were obtained, which showed few  gram negative and positive rods, rare gram-positive cocci, and abundant  white blood cells.  The patient was optimized medically, his glucose was  elevated.  He was placed on insulin in addition to his oral diabetic  medications.  He underwent the left thoracentesis on June 24, 2006, 70  mL of pleural fluid were aspirated.  Dr. Edwyna Shell was then consulted.  The  patient was then transferred to [facility] for further evaluation and  treatment.   As previously stated, he underwent the  aforementioned left lung surgery  by Dr. Edwyna Shell on June 29, 2007.  Postoperatively, he had leukocytosis.  Zosyn was continued.  Infectious disease consult was then obtained.  The  patient did have hyperglycemia postop and was placed on insulin  accordingly.  The patient did not have an air leak from the chest tube  postoperatively, the posterior chest tube was on July 03, 2007.  Follow  up chest x-ray revealed what appeared to be a small left pneumothorax.  Chest tube was then removed which then revealed no pneumothorax on  follow up x-ray.  The patient's leukocytosis continued to decrease,  currently it is down to 14,500.  He remains afebrile.  Vital signs are  stable.  His Zosyn has been discontinued and the patient has been placed  on Augmentin 875 mg p.o. twice daily.  Throughout, the patient remained  hemodynamically stable and the chest x-ray shows no new abnormalities.  The patient will be discharged on July 06, 2007.   LABORATORY DATA:  Last  laboratory studies are as follows, last BMET was  done today.  Potassium is 4, BUN and creatinine were 9 and 0.91  respectively.  A CBC done also today white count 14,500, H&H 9.5 and  20.2,  and platelet count 522,000.  Last chest x-ray was also performed  today, which showed no evidence of pneumothorax.  Nodular densities on  the left lateral chest wall blunting of the left costophrenic angle.  Another chest x-ray will be obtained in the morning prior to discharge.   DISCHARGE INSTRUCTIONS:  The patient should remain on a medium caloric  diabetic diet.  He is to continue use his incentive spirometer, walk  daily, increase in fixed endurance as tolerated.  He may shower.  He is  not to lift or drive for 3-4 weeks.  He may use soap and water over his  wounds.  He is then to let them open to air.  His follow up appointments  include Dr. Edwyna Shell on July 13, 2007.  A chest x-ray will be obtained  prior to this appointment.  He needs to contact Dr. Ninetta Lights from  Infectious Disease regarding a follow up appointment and he should call  Dr. Gerda Diss, his primary care physician, 1-2 weeks to closely follow his  glucose management.   DISCHARGE MEDICATIONS:  At time of discharge include the following;  1. Simvastatin 40 mg p.o. h.s.  2. Glyburide 5 mg p.o. twice daily.  3. Actos 45 mg p.o. daily.  4. Losartan 25 mg p.o. daily.  5. Metformin 1000 mg p.o. twice daily.  6. EC-ASA 81 mg p.o. daily.  7. Augmentin 875 mg p.o. twice daily times 10 days.  8. Darvocet-N 100 1-2 tablets p.o. 4-6 hours as needed for pain.   Pathology results reveal fungus and AFB cultures, no growth to date.  Blood cultures and tissue cultures as well as body fluid cultures have  been negative thus far.      Doree Fudge, [place]      Ines Bloomer, M.D.  Electronically Signed    DZ/MEDQ  D:  07/05/2007  T:  07/06/2007  Job:  [id number]

## 2010-10-02 LAB — CULTURE, BLOOD (ROUTINE X 2)

## 2010-10-02 LAB — COMPREHENSIVE METABOLIC PANEL
Alkaline Phosphatase: 184 — ABNORMAL HIGH
BUN: 12
BUN: 16
CO2: 28
CO2: 30
Calcium: 8.1 — ABNORMAL LOW
Chloride: 95 — ABNORMAL LOW
Chloride: 95 — ABNORMAL LOW
Creatinine, Ser: 0.75
Creatinine, Ser: 0.92
GFR calc Af Amer: >60
GFR calc non Af Amer: >60
GFR calc non Af Amer: >60
Glucose, Bld: 89
Potassium: 3.6
Total Bilirubin: 0.4
Total Bilirubin: 0.7

## 2010-10-02 LAB — BODY FLUID CULTURE: Culture: NO GROWTH

## 2010-10-02 LAB — BASIC METABOLIC PANEL
BUN: 18
BUN: 16
BUN: 16
BUN: 9
BUN: 12
BUN: 17
CO2: 28
CO2: 30
CO2: 30
CO2: 31
CO2: 26
CO2: 27
CO2: 28
CO2: 31
Calcium: 8.2 — ABNORMAL LOW
Calcium: 8.2 — ABNORMAL LOW
Calcium: 8.4
Calcium: 8.7
Chloride: 103
Chloride: 100
Chloride: 103
Chloride: 108
Chloride: 97
Chloride: 101
Chloride: 103
Chloride: 94 — ABNORMAL LOW
Creatinine, Ser: 0.84
Creatinine, Ser: 0.84
Creatinine, Ser: 0.91
Creatinine, Ser: 0.89
Creatinine, Ser: 1.28
GFR calc Af Amer: >60
GFR calc Af Amer: >60
GFR calc Af Amer: >60
GFR calc Af Amer: >60
GFR calc Af Amer: >60
GFR calc non Af Amer: >60
GFR calc non Af Amer: >60
GFR calc non Af Amer: >60
Glucose, Bld: 178 — ABNORMAL HIGH
Glucose, Bld: 142 — ABNORMAL HIGH
Glucose, Bld: 89
Glucose, Bld: 94
Glucose, Bld: 99
Glucose, Bld: 238 — ABNORMAL HIGH
Potassium: 4
Potassium: 4
Potassium: 4.2
Potassium: 4.3
Potassium: 3.7
Potassium: 3.9
Potassium: 4.4
Sodium: 136
Sodium: 138
Sodium: 139
Sodium: 135

## 2010-10-02 LAB — CARDIAC PANEL(CRET KIN+CKTOT+MB+TROPI)
CK, MB: 1.3
CK, MB: 1.5
Relative Index: INVALID
Total CK: 44
Total CK: 65
Total CK: 75
Troponin I: 0.01

## 2010-10-02 LAB — CBC
HCT: 28.2 — ABNORMAL LOW
HCT: 29.5 — ABNORMAL LOW
HCT: 30.4 — ABNORMAL LOW
HCT: 30.5 — ABNORMAL LOW
HCT: 34 — ABNORMAL LOW
HCT: 33.3 — ABNORMAL LOW
HCT: 34.7 — ABNORMAL LOW
HCT: 35.7 — ABNORMAL LOW
Hemoglobin: 10.2 — ABNORMAL LOW
Hemoglobin: 10.2 — ABNORMAL LOW
Hemoglobin: 10.3 — ABNORMAL LOW
Hemoglobin: 11.9 — ABNORMAL LOW
Hemoglobin: 11.7 — ABNORMAL LOW
Hemoglobin: 12 — ABNORMAL LOW
Hemoglobin: 13.4
MCHC: 33.4
MCHC: 33.7
MCHC: 34.9
MCHC: 33.4
MCHC: 33.6
MCHC: 33.8
MCHC: 34
MCHC: 34
MCV: 91.8
MCV: 92.2
MCV: 92.2
MCV: 92.7
MCV: 91.8
MCV: 93.1
MCV: 93.2
Platelets: 411 — ABNORMAL HIGH
Platelets: 522 — ABNORMAL HIGH
Platelets: 254
RBC: 3.05 — ABNORMAL LOW
RBC: 3.2 — ABNORMAL LOW
RBC: 3.28 — ABNORMAL LOW
RBC: 3.46 — ABNORMAL LOW
RBC: 3.7 — ABNORMAL LOW
RBC: 3.57 — ABNORMAL LOW
RBC: 3.75 — ABNORMAL LOW
RBC: 3.83 — ABNORMAL LOW
RBC: 4.26
RDW: 13.3
RDW: 13.4
RDW: 13.4
RDW: 12.9
RDW: 13.2
WBC: 14.5 — ABNORMAL HIGH
WBC: 18.5 — ABNORMAL HIGH
WBC: 22.6 — ABNORMAL HIGH
WBC: 23.2 — ABNORMAL HIGH
WBC: 21.6 — ABNORMAL HIGH
WBC: 22.2 — ABNORMAL HIGH

## 2010-10-02 LAB — URINE MICROSCOPIC-ADD ON

## 2010-10-02 LAB — PROTIME-INR: INR: 1.2

## 2010-10-02 LAB — BLOOD GAS, ARTERIAL
Bicarbonate: 29.4 — ABNORMAL HIGH
Patient temperature: 98.6
TCO2: 30.7
pCO2 arterial: 40.3
pH, Arterial: 7.476 — ABNORMAL HIGH
pO2, Arterial: 75 — ABNORMAL LOW

## 2010-10-02 LAB — POCT I-STAT 3, ART BLOOD GAS (G3+)
Acid-Base Excess: 5 — ABNORMAL HIGH
Bicarbonate: 30.5 — ABNORMAL HIGH
Operator id: 273681
Patient temperature: 98.5
TCO2: 32
pH, Arterial: 7.421

## 2010-10-02 LAB — FUNGUS CULTURE W SMEAR
Fungal Smear: NONE SEEN
Fungal Smear: NONE SEEN

## 2010-10-02 LAB — CULTURE, RESPIRATORY W GRAM STAIN: Culture: NORMAL

## 2010-10-02 LAB — HEMOGLOBIN A1C: Mean Plasma Glucose: 279

## 2010-10-02 LAB — D-DIMER, QUANTITATIVE: D-Dimer, Quant: 1.63 — ABNORMAL HIGH

## 2010-10-02 LAB — AFB CULTURE WITH SMEAR (NOT AT ARMC)
Acid Fast Smear: NONE SEEN
Acid Fast Smear: NONE SEEN

## 2010-10-02 LAB — TYPE AND SCREEN: Antibody Screen: NEGATIVE

## 2010-10-02 LAB — URINALYSIS, ROUTINE W REFLEX MICROSCOPIC
Ketones, ur: NEGATIVE
Nitrite: NEGATIVE
Urobilinogen, UA: 0.2
pH: 5

## 2010-10-02 LAB — EXPECTORATED SPUTUM ASSESSMENT W GRAM STAIN, RFLX TO RESP C

## 2010-10-02 LAB — TISSUE CULTURE

## 2010-10-02 LAB — HEPATIC FUNCTION PANEL
ALT: 19
Indirect Bilirubin: 0.5
Total Protein: 5.8 — ABNORMAL LOW

## 2010-10-20 LAB — COMPREHENSIVE METABOLIC PANEL
AST: 24
Albumin: 3.8
Chloride: 99
Creatinine, Ser: 1.07
GFR calc Af Amer: >60
Potassium: 4.9
Sodium: 132 — ABNORMAL LOW
Total Bilirubin: 0.5

## 2010-10-20 LAB — DIFFERENTIAL
Basophils Absolute: 0.1
Eosinophils Relative: 4
Lymphocytes Relative: 17
Monocytes Absolute: 0.5

## 2010-10-20 LAB — URINALYSIS, ROUTINE W REFLEX MICROSCOPIC
Glucose, UA: >1000 — AB
Ketones, ur: NEGATIVE
Nitrite: NEGATIVE
Protein, ur: NEGATIVE
Urobilinogen, UA: 0.2
pH: 5

## 2010-10-20 LAB — TYPE AND SCREEN
ABO/RH(D): O POS
Antibody Screen: NEGATIVE

## 2010-10-20 LAB — CBC
MCV: 93.4
Platelets: 304
WBC: 7.2

## 2010-10-20 LAB — URINE MICROSCOPIC-ADD ON

## 2010-10-20 LAB — ABO/RH: ABO/RH(D): O POS

## 2011-02-19 ENCOUNTER — Ambulatory Visit (HOSPITAL_COMMUNITY)
Admission: RE | Admit: 2011-02-19 | Discharge: 2011-02-19 | Disposition: A | Discharge status: Home / Self Care | Payer: Non-veteran care | Source: Ambulatory Visit | Attending: Thoracic Surgery | Admitting: Thoracic Surgery

## 2011-02-19 ENCOUNTER — Encounter (HOSPITAL_COMMUNITY): Payer: Self-pay | Admitting: Occupational Therapy

## 2011-02-19 DIAGNOSIS — M25629 Stiffness of unspecified elbow, not elsewhere classified: Secondary | ICD-10-CM | POA: Insufficient documentation

## 2011-02-19 DIAGNOSIS — M6281 Muscle weakness (generalized): Secondary | ICD-10-CM | POA: Insufficient documentation

## 2011-02-19 DIAGNOSIS — IMO0001 Reserved for inherently not codable concepts without codable children: Secondary | ICD-10-CM | POA: Insufficient documentation

## 2011-02-19 DIAGNOSIS — M25529 Pain in unspecified elbow: Secondary | ICD-10-CM | POA: Insufficient documentation

## 2011-02-19 DIAGNOSIS — M25539 Pain in unspecified wrist: Secondary | ICD-10-CM | POA: Insufficient documentation

## 2011-02-19 DIAGNOSIS — M25639 Stiffness of unspecified wrist, not elsewhere classified: Secondary | ICD-10-CM | POA: Insufficient documentation

## 2011-02-19 NOTE — Evaluation (Signed)
Occupational Therapy Evaluation  Patient Details  Name: Logan Hooper MRN: [id number] Date of Birth: [birth year]  Today's Date: 02/19/2011 Time: 9147-8295 Time Calculation (min): 45 min  Visit#: 1  of 12   Re-eval: 03/17/11  Assessment Diagnosis: Osteo Arthritis of Right Elbow  S/P arthroscope removal of loose bodies. Surgical Date: 01/29/11 Next MD Visit: 03/18/11 Prior Therapy: None  Past Medical History: No past medical history on file. Past Surgical History: No past surgical history on file.  Subjective Symptoms/Limitations Symptoms: Patient states pain 4-5/10 with movement of end range movement of the right elbow with decreased extension and flexion mobility. Pain Assessment Currently in Pain?: Yes Pain Score:   5 Pain Location: Elbow Pain Orientation: Right Pain Type: Surgical pain Pain Onset: Other (comment) (Before surgery pain 7-9/10 and over 10 at times) Pain Frequency: Intermittent Pain Relieving Factors: Rest Effect of Pain on Daily Activities: Gaurding, Not able to pick up certain items, Pitching Cornhole (right arm is pitching arm) Playing golf,Washing his back Multiple Pain Sites: Yes  Precautions/Restrictions OA, DJD, DM, HTN    Prior Functioning  Home Living Lives With: Spouse Receives Help From: Family Type of Home: House Home Access: Stairs to enter Entrance Stairs-Rails: None Entrance Stairs-Number of Steps: 3 Bathroom Shower/Tub: Tub/shower unit;Door [employer] Toilet: Handicapped height Bathroom Accessibility: Yes Home Adaptive Equipment: Built-in shower seat;Hand-held shower hose Prior Function Level of Independence: Independent with homemaking with ambulation;Needs assistance with ADLs Dressing: Minimal Driving: Yes Vocation: On disability Leisure: Hobbies-yes (Comment)  Assessment ADL/Vision/Perception Vision - History Baseline Vision: Wears glasses only for reading Patient Visual Report: No change from baseline Vision -  Assessment Eye Alignment: Within Functional Limits Vision Assessment: Vision not tested Perception Perception: Within Functional Limits Praxis Praxis: Intact  Cognition/Observation Cognition Overall Cognitive Status: Appears within functional limits for tasks assessed Arousal/Alertness: Awake/alert Orientation Level: Oriented X4 Safety/Judgment: Appears intact Observation/Other Assessments Observations:  (adhesion formation over lateral elbow at 4 sites scoped)  Sensation/Coordination/Edema Sensation Light Touch: Appears Intact Stereognosis: Appears Intact Hot/Cold: Appears Intact Proprioception: Appears Intact Additional Comments: Artrhitis Rt shoulder Coordination Gross Motor Movements are Fluid and Coordinated: Yes Fine Motor Movements are Fluid and Coordinated: Yes Edema Edema: slight edema  Additional Assessments RUE Assessment RUE Assessment: Exceptions to [facility] RUE AROM (degrees) Right Shoulder Extension  0-60: 36 Degrees Right Shoulder Flexion  0-170: 143 Degrees Right Shoulder ABduction 0-140: 110 Degrees Right Shoulder Internal Rotation  0-70: 75 Degrees Right Shoulder External Rotation  0-90: 50 Degrees Right Elbow Flexion/Extension 0-135-150: 30  Right Forearm Pronation  0-80-90: 90 Degrees Right Forearm Supination  0-80-90: 78 Degrees Right Wrist Extension 0-70: 41 Degrees Right Wrist Flexion 0-80: 62 Degrees Right Wrist Radial Deviation 0-20: 20 Degrees Right Wrist Ulnar Deviation 0-30: 20 Degrees Right Composite Finger Flexion: 25% Right Thumb Opposition: Digit 5;Digit 4;Digit 3;Digit 2 LUE Assessment LUE Assessment: Within Functional LimitsZ   Occupational Therapy Assessment and Plan OT Assessment and Plan Clinical Impression Statement: Mr. Robling is a 66 y/o male with long history of OA resulting in Loose bodies in Right Elbow joint. Patient underwent Arthroscope to  Rehab Potential: Excellent OT Frequency: Min 2X/week OT Duration: 6 weeks OT  Treatment/Interventions: Self-care/ADL training;Therapeutic exercise;Modalities;Therapeutic activities;Manual therapy;Patient/family education OT Plan: Plan: OT to see 2 x a week for 6 weeks to decrease pain, and scar adhesions; increase mobility strength and function to return patient to prior level of function.   Goals Home Exercise Program Pt will Perform Home Exercise Program: Independently Short Term Goals Time to Complete  Short Term Goals: 4 weeks Short Term Goal 1: Adherions over athroscopy site will be softened and lengthened and not raised above the skin or painful with palpation. Short Term Goal 2: Patient will demonstrate 10 degree increased left elbow ext. and flexion to -20/130  Short Term Goal 3: Decreased pain in Left elbow with mobility 2/10 Short Term Goal 4: 20 Degree increase in AROM of Left shoulder with no pain at end range. Short Term Goal 5: Patient will be able to wash his back  Long Term Goals Time to Complete Long Term Goals: Other (comment) (6 weeks) Long Term Goal 1: Patient will return to playing pitch cornhole without pain. Long Term Goal 2: Patient will be able to return to playing golf without pain. Long Term Goal 3: Patient will regain elbow mobility to -15/135 with 1/10 pain at end range. Long Term Goal 4: 4+/5 strength in flexion and ext of elbow. Long Term Goal 5: Full functional use of Left upper extremity for reaching into cupboards.  Problem List Diabetes type 2 with neurological manifestations, Chronic kidney disease, OA, Spinal Stenosis, Lumbar Radiculopathy, Inguinal Hernia, Rotator cuff repair, Rotator Cuff Sprain, Strain, Degenerative intervertebral disc, Adhesive Capulitis, OA of Knee, PTSD   End of Session Activity Tolerance: Patient tolerated treatment well General Behavior During Session: [facility] for tasks performed Cognition: [facility] for tasks performed   Lisa Roca OTR/L 02/19/2011, 5:18 PM  Physician Documentation Your signature is  required to indicate approval of the treatment plan as stated above.  Please sign and either send electronically or make a copy of this report for your files and return this physician signed original.  Please mark one 1.__approve of plan  2. ___approve of plan with the following conditions.   ______________________________                                                          _____________________ Physician Signature                                                                                                             Date

## 2011-02-24 ENCOUNTER — Ambulatory Visit (HOSPITAL_COMMUNITY)
Admission: RE | Admit: 2011-02-24 | Discharge: 2011-02-24 | Disposition: A | Discharge status: Home / Self Care | Payer: Non-veteran care | Source: Ambulatory Visit | Attending: Thoracic Surgery | Admitting: Thoracic Surgery

## 2011-02-24 NOTE — Progress Notes (Signed)
Occupational Therapy Treatment  Patient Details  Name: Logan Hooper MRN: [id number] Date of Birth: [birth year]  Today's Date: 02/24/2011 Time: 7829-5621 Time Calculation (min): 49 min  Visit#: 2  of 12   Re-eval: 03/17/11 Manual Therapy [phone] 32' Therapeutic Exercise [phone] 16'    Subjective Symptoms/Limitations Symptoms: S:  The scar is still raised. Pain Assessment Currently in Pain?: Yes Pain Location: Elbow Pain Orientation: Right  Precautions/Restrictions     Exercise/Treatments Therapy Ball Flexion: 20 reps ABduction: 20 reps   Elbow Exercises Elbow Flexion: PROM;AROM;10 reps Elbow Extension: PROM;AROM;10 reps Forearm Supination: PROM;AROM;10 reps Forearm Pronation: PROM;AROM;10 reps Wrist Flexion: PROM;AROM;10 reps Wrist Extension: PROM;AROM;10 reps   Sponges: 23,24 Theraputty - Flatten: yellow Theraputty - Roll: yellow Theraputty - Grip: yellow  Wrist Exercises Forearm Supination: PROM;AROM;10 reps Forearm Pronation: PROM;AROM;10 reps Wrist Flexion: PROM;AROM;10 reps Wrist Extension: PROM;AROM;10 reps       Manual Therapy Manual Therapy: Myofascial release Myofascial Release: MFR, manual stretching and scar release to right elbow to decrease pain and restrictions to increase A/PROM, strength and reduce scar adhesions.  Occupational Therapy Assessment and Plan OT Assessment and Plan Clinical Impression Statement: A:  Added multiple new exercises which patient tolerated well. Rehab Potential: Excellent OT Plan: P:  Add tband for elbow flexion and extension as long as it does not cause increased pain.  Add UBE.   Goals Home Exercise Program Pt will Perform Home Exercise Program: Independently Short Term Goals Time to Complete Short Term Goals: 4 weeks Short Term Goal 1: Adherions over athroscopy site will be softened and lengthened and not raised above the skin or painful with palpation. Short Term Goal 2: Patient will demonstrate 10 degree  increased left elbow ext. and flexion to -20/130  Short Term Goal 3: Decreased pain in Left elbow with mobility 2/10 Short Term Goal 4: 20 Degree increase in AROM of Left shoulder with no pain at end range. Short Term Goal 5: Patient will be able to wash his back  Long Term Goals Time to Complete Long Term Goals: Other (comment) (6 weeks) Long Term Goal 1: Patient will return to playing pitch cornhole without pain. Long Term Goal 2: Patient will be able to return to playing golf without pain. Long Term Goal 3: Patient will regain elbow mobility to -15/135 with 1/10 pain at end range. Long Term Goal 4: 4+/5 strength in flexion and ext of elbow. Long Term Goal 5: Full functional use of Left upper extremity for reaching into cupboards.  Problem List There is no problem list on file for this patient.   End of Session Activity Tolerance: Patient tolerated treatment well General Behavior During Session: [facility] for tasks performed Cognition: [facility] for tasks performed  GO No functional reporting required   Emmersen Garraway L. Penny Arrambide, COTA/L  02/24/2011, 12:03 PM

## 2011-02-27 ENCOUNTER — Ambulatory Visit (HOSPITAL_COMMUNITY)
Admission: RE | Admit: 2011-02-27 | Discharge: 2011-02-27 | Disposition: A | Discharge status: Home / Self Care | Payer: Non-veteran care | Source: Ambulatory Visit | Attending: Occupational Therapy | Admitting: Occupational Therapy

## 2011-02-27 NOTE — Progress Notes (Signed)
Occupational Therapy Treatment  Patient Details  Name: Logan Hooper MRN: [id number] Date of Birth: 12-15-1945  Today's Date: 02/27/2011 Time: 8295-6213 Time Calculation (min): 41 min  Visit#: 3  of 12   Re-eval: 03/17/11 Therapeutic exercise [phone] 23' Manual Therapy [phone] 17'     Subjective Symptoms/Limitations Symptoms: S:  I have been trying to get the pain better. Pain Assessment Currently in Pain?: Yes Pain Score:   3 Pain Location: Elbow Pain Orientation: Right  Precautions/Restrictions     Exercise/Treatments   Therapy Ball Flexion: 20 reps ABduction: 20 reps Right/Left: 5 reps            Elbow Exercises Elbow Flexion: PROM;Strengthening;10 reps Elbow Extension: PROM;Strengthening;10 reps Forearm Supination: PROM;Strengthening;10 reps Forearm Pronation: PROM;Strengthening;10 reps Wrist Flexion: PROM;Strengthening;10 reps Wrist Extension: PROM;Strengthening;10 reps   Sponges: 38,39 Theraputty - Flatten: pink Theraputty - Roll: pink Theraputty - Grip: pink     Manual Therapy Manual Therapy: Myofascial release Myofascial Release: MFR and manual stretching and scar release to right elbow to decrease pain and restrictions to increase A/Prom, strength and reduce scar adhesions.  Occupational Therapy Assessment and Plan OT Assessment and Plan Rehab Potential: Excellent OT Plan: P:  Add tband and UBE,   Goals Home Exercise Program Pt will Perform Home Exercise Program: Independently Short Term Goals Time to Complete Short Term Goals: 4 weeks Short Term Goal 1: Adherions over athroscopy site will be softened and lengthened and not raised above the skin or painful with palpation. Short Term Goal 2: Patient will demonstrate 10 degree increased left elbow ext. and flexion to -20/130  Short Term Goal 3: Decreased pain in Left elbow with mobility 2/10 Short Term Goal 4: 20 Degree increase in AROM of Left shoulder with no pain at end range. Short  Term Goal 5: Patient will be able to wash his back  Long Term Goals Time to Complete Long Term Goals: Other (comment) (6 weeks) Long Term Goal 1: Patient will return to playing pitch cornhole without pain. Long Term Goal 2: Patient will be able to return to playing golf without pain. Long Term Goal 3: Patient will regain elbow mobility to -15/135 with 1/10 pain at end range. Long Term Goal 4: 4+/5 strength in flexion and ext of elbow. Long Term Goal 5: Full functional use of Left upper extremity for reaching into cupboards.  Problem List There is no problem list on file for this patient.   End of Session Activity Tolerance: Patient tolerated treatment well General Behavior During Session: [facility] for tasks performed Cognition: [facility] for tasks performed  GO No functional reporting required   Clarabelle Oscarson L. Kellee Sittner, COTA/L  02/27/2011, 9:42 AM

## 2011-03-03 ENCOUNTER — Ambulatory Visit (HOSPITAL_COMMUNITY)
Admission: RE | Admit: 2011-03-03 | Discharge: 2011-03-03 | Disposition: A | Discharge status: Home / Self Care | Payer: Non-veteran care | Source: Ambulatory Visit | Attending: Thoracic Surgery | Admitting: Thoracic Surgery

## 2011-03-03 NOTE — Progress Notes (Signed)
Occupational Therapy Treatment  Patient Details  Name: Logan Hooper MRN: [id number] Date of Birth: 1945-04-14  Today's Date: 03/03/2011 Time: 4098-1191 Time Calculation (min): 45 min  Visit#: 4  of 12   Re-eval: 03/17/11 Manual Therapy [phone] 11' Therapeutic Exercise [phone] 25'    Subjective Symptoms/Limitations Symptoms: S: I only hurts when I bend it all the way. Pain Assessment Currently in Pain?: No/denies  Precautions/Restrictions     Exercise/Treatments   Therapy Ball Flexion: 20 reps ABduction: 20 reps Right/Left: 5 reps    Elbow Exercises Elbow Flexion: PROM;10 reps Elbow Extension: PROM;10 reps Forearm Supination: PROM;10 reps Forearm Pronation: PROM;10 reps Wrist Flexion: PROM;10 reps Wrist Extension: PROM;10 reps   Sponges: 30,38 Theraputty - Flatten: green Theraputty - Roll: green Theraputty - Grip: green Hand Gripper with Small Beads: x10  Wrist Exercises Forearm Supination: PROM;10 reps Forearm Pronation: PROM;10 reps Wrist Flexion: PROM;10 reps Wrist Extension: PROM;10 reps   Sponges: 30,38 Theraputty - Flatten: green Theraputty - Roll: green Theraputty - Grip: green Theraputty - Pinch: green Hand Gripper with Small Beads: x10  Hand Exercises Theraputty - Flatten: green Theraputty - Roll: green Theraputty - Grip: green Theraputty - Pinch: green Hand Gripper with Small Beads: x10 Sponges: 30,38     Manual Therapy Manual Therapy: Myofascial release Myofascial Release: MFR, manual stretching and scar release to right elbow to decrease pain and restrictions to increase A/PROM, strength and reduce scar adhesions.  Occupational Therapy Assessment and Plan OT Assessment and Plan Clinical Impression Statement: A:  Added UBE today which patient tolerated well and said made his arm feel looser. Rehab Potential: Excellent OT Plan: P:  Add tband.   Goals Home Exercise Program Pt will Perform Home Exercise Program:  Independently Short Term Goals Time to Complete Short Term Goals: 4 weeks Short Term Goal 1: Adherions over athroscopy site will be softened and lengthened and not raised above the skin or painful with palpation. Short Term Goal 2: Patient will demonstrate 10 degree increased left elbow ext. and flexion to -20/130  Short Term Goal 3: Decreased pain in Left elbow with mobility 2/10 Short Term Goal 4: 20 Degree increase in AROM of Left shoulder with no pain at end range. Short Term Goal 5: Patient will be able to wash his back  Long Term Goals Time to Complete Long Term Goals: Other (comment) (6 weeks) Long Term Goal 1: Patient will return to playing pitch cornhole without pain. Long Term Goal 2: Patient will be able to return to playing golf without pain. Long Term Goal 3: Patient will regain elbow mobility to -15/135 with 1/10 pain at end range. Long Term Goal 4: 4+/5 strength in flexion and ext of elbow. Long Term Goal 5: Full functional use of Left upper extremity for reaching into cupboards.  Problem List There is no problem list on file for this patient.   End of Session Activity Tolerance: Patient tolerated treatment well General Behavior During Session: [facility] for tasks performed Cognition: [facility] for tasks performed  GO No functional reporting required   Ambermarie Honeyman L. Niklas Chretien, COTA/L  03/03/2011, 9:40 AM

## 2011-03-05 ENCOUNTER — Ambulatory Visit (HOSPITAL_COMMUNITY)
Admission: RE | Admit: 2011-03-05 | Discharge: 2011-03-05 | Disposition: A | Discharge status: Home / Self Care | Payer: Non-veteran care | Source: Ambulatory Visit | Attending: Thoracic Surgery | Admitting: Thoracic Surgery

## 2011-03-05 NOTE — Progress Notes (Signed)
Occupational Therapy Treatment  Patient Details  Name: Logan Hooper MRN: [id number] Date of Birth: [birth year]  Today's Date: 03/05/2011 Time: 1478-2956 Time Calculation (min): 37 min  Visit#: 5  of 12   Re-eval: 03/17/11 Manual Therapy [phone] 12' Therapeutic Exercise [phone] 24'    Subjective Symptoms/Limitations Symptoms: S:  It hurts when I bend it in. Other than that it does pretty good. Pain Assessment Currently in Pain?: No/denies  Precautions/Restrictions     Exercise/Treatments   Therapy Ball Flexion: 20 reps ABduction: 20 reps Right/Left: 5 reps Elbow Exercises Elbow Flexion: PROM;10 reps;Strengthening;15 reps Bar Weights/Barbell (Elbow Flexion): 3 lbs Elbow Extension: PROM;10 reps;Strengthening;15 reps Bar Weights/Barbell (Elbow Extension): 3 lbs Forearm Supination: PROM;10 reps;Strengthening;15 reps;Bar weights/barbell (3#) Forearm Pronation: PROM;10 reps;Strengthening;15 reps;Bar weights/barbell (3#) Wrist Flexion: PROM;10 reps;Strengthening;15 reps Wrist Extension: PROM;10 reps;Strengthening;15 reps   UBE (Upper Arm Bike): 3' forward 3' reverse 2.0 Theraputty - Flatten: green Theraputty - Roll: green Theraputty - Grip: green  Hand Exercises Theraputty - Flatten: green Theraputty - Roll: green Theraputty - Grip: green     Manual Therapy Manual Therapy: Myofascial release Myofascial Release: MFR, manual stretching and scar release to right elbow to decrease pain and restrictions to increase A/PROM, strength and reduce scar adhesions.  Occupational Therapy Assessment and Plan OT Assessment and Plan Clinical Impression Statement: A:  Added 3# to elbow flexion and extension. Rehab Potential: Excellent OT Plan: P:  Increase reps.   Goals Home Exercise Program Pt will Perform Home Exercise Program: Independently Short Term Goals Time to Complete Short Term Goals: 4 weeks Short Term Goal 1: Adherions over athroscopy site will be softened and  lengthened and not raised above the skin or painful with palpation. Short Term Goal 2: Patient will demonstrate 10 degree increased left elbow ext. and flexion to -20/130  Short Term Goal 3: Decreased pain in Left elbow with mobility 2/10 Short Term Goal 4: 20 Degree increase in AROM of Left shoulder with no pain at end range. Short Term Goal 5: Patient will be able to wash his back  Long Term Goals Time to Complete Long Term Goals: Other (comment) (6 weeks) Long Term Goal 1: Patient will return to playing pitch cornhole without pain. Long Term Goal 2: Patient will be able to return to playing golf without pain. Long Term Goal 3: Patient will regain elbow mobility to -15/135 with 1/10 pain at end range. Long Term Goal 4: 4+/5 strength in flexion and ext of elbow. Long Term Goal 5: Full functional use of Left upper extremity for reaching into cupboards.  Problem List There is no problem list on file for this patient.   End of Session Activity Tolerance: Patient tolerated treatment well General Behavior During Session: [facility] for tasks performed Cognition: [facility] for tasks performed  GO No functional reporting required  Giannis Corpuz L. Shantaya Bluestone, COTA/L  03/05/2011, 9:38 AM

## 2011-03-10 ENCOUNTER — Ambulatory Visit (HOSPITAL_COMMUNITY)
Admission: RE | Admit: 2011-03-10 | Discharge: 2011-03-10 | Disposition: A | Discharge status: Home / Self Care | Payer: Non-veteran care | Source: Ambulatory Visit | Attending: Thoracic Surgery | Admitting: Thoracic Surgery

## 2011-03-10 DIAGNOSIS — M6281 Muscle weakness (generalized): Secondary | ICD-10-CM | POA: Insufficient documentation

## 2011-03-10 DIAGNOSIS — M25529 Pain in unspecified elbow: Secondary | ICD-10-CM | POA: Insufficient documentation

## 2011-03-10 DIAGNOSIS — M25629 Stiffness of unspecified elbow, not elsewhere classified: Secondary | ICD-10-CM | POA: Insufficient documentation

## 2011-03-10 DIAGNOSIS — IMO0001 Reserved for inherently not codable concepts without codable children: Secondary | ICD-10-CM | POA: Insufficient documentation

## 2011-03-10 NOTE — Progress Notes (Signed)
Occupational Therapy Treatment  Patient Details  Name: Logan Hooper MRN: [id number] Date of Birth: July 13, 1945  Today's Date: 03/10/2011 Time: 4098-1191 Time Calculation (min): 47 min  Visit#: 7  of 12   Re-eval: 03/17/11    Subjective Symptoms/Limitations Symptoms: S:  It bothers me a little today. Pain Assessment Currently in Pain?: Yes Pain Score:   2 Pain Location: Elbow  Precautions/Restrictions     03/10/11 0928  Shoulder Exercises: Therapy Ball  Flexion 20 reps  ABduction 20 reps  Right/Left 5 reps    Exercise/Treatments       Manual Therapy Manual Therapy: Myofascial release Myofascial Release: MFR, manual stretching and scar release to right elbow to decrease pain and restrictions to increase A/PROM, strength and reduce scar adhesions.  Occupational Therapy Assessment and Plan OT Assessment and Plan Clinical Impression Statement: A:  Patient with decrease c/o pain and stiffness after UBE. Rehab Potential: Excellent OT Plan: P:  Add tband to HEP>   Goals    Problem List There is no problem list on file for this patient.   End of Session Activity Tolerance: Patient tolerated treatment well General Behavior During Session: [facility] for tasks performed Cognition: [facility] for tasks performed   03/10/11 0900  Elbow Exercises  Elbow Flexion PROM;10 reps;Strengthening;15 reps  Bar Weights/Barbell (Elbow Flexion) 3 lbs  Elbow Extension PROM;10 reps;Strengthening;15 reps  Bar Weights/Barbell (Elbow Extension) 3 lbs  Forearm Supination PROM;10 reps;Strengthening;15 reps;Bar weights/barbell (3#)  Forearm Pronation PROM;10 reps;Strengthening;15 reps;Bar weights/barbell (3#)  Wrist Flexion PROM;10 reps;Strengthening;15 reps (3#)  Wrist Extension PROM;10 reps;Strengthening;15 reps (3#)  Additional Elbow Exercises  UBE (Upper Arm Bike) 3' forward 3' reverse 2.0  Sponges 42,44  Theraputty - Flatten blue  Theraputty - Roll blue  Theraputty - Grip blue     GO No functional reporting required   Tieshia Rettinger L. Mischa Brittingham, COTA/L  03/10/2011, 1:58 PM

## 2011-03-12 ENCOUNTER — Ambulatory Visit (HOSPITAL_COMMUNITY)
Admission: RE | Admit: 2011-03-12 | Discharge: 2011-03-12 | Disposition: A | Discharge status: Home / Self Care | Payer: Non-veteran care | Source: Ambulatory Visit | Attending: Thoracic Surgery | Admitting: Thoracic Surgery

## 2011-03-12 NOTE — Progress Notes (Signed)
Occupational Therapy Treatment  Patient Details  Name: Logan Hooper MRN: [id number] Date of Birth: Nov 28, 1945  Today's Date: 03/12/2011 Time: 7829-5621 Time Calculation (min): 52 min  Visit#: 8  of 12   Re-eval: 03/17/11 Manual Therapy [phone] 18' Therapeutic Exercise [phone] 98'    Subjective Symptoms/Limitations Symptoms: S:  I guess it is doing ok, it must have been the rain yesterday that made it hurt. Pain Assessment Pain Score:   2 Pain Location: Elbow  Precautions/Restrictions     Exercise/Treatments Therapy Ball Flexion:  (d/c) ABduction:  (d/c)   Elbow Exercises Elbow Flexion: PROM;10 reps;Strengthening;15 reps Bar Weights/Barbell (Elbow Flexion): 3 lbs Elbow Extension: PROM;10 reps;Strengthening;15 reps Bar Weights/Barbell (Elbow Extension): 3 lbs Forearm Supination: PROM;10 reps;Strengthening;15 reps;Bar weights/barbell Forearm Pronation: PROM;10 reps;Strengthening;15 reps;Bar weights/barbell Wrist Flexion: PROM;10 reps;Strengthening;15 reps Wrist Extension: PROM;10 reps;Strengthening;15 reps   UBE (Upper Arm Bike): 3' forward 3' reverse 3.0 Sponges: 34,29,27 no yellow easy ones. Theraputty - Flatten: blue Theraputty - Roll: blue Theraputty - Grip: blue  Wrist Exercises Forearm Supination: PROM;10 reps;Strengthening;15 reps;Bar weights/barbell Forearm Pronation: PROM;10 reps;Strengthening;15 reps;Bar weights/barbell Wrist Flexion: PROM;10 reps;Strengthening;15 reps Wrist Extension: PROM;10 reps;Strengthening;15 reps   Sponges: 34,29,27 no yellow easy ones. Theraputty - Flatten: blue Theraputty - Roll: blue Theraputty - Grip: blue  Hand Exercises Theraputty - Flatten: blue Theraputty - Roll: blue Theraputty - Grip: blue Sponges: 34,29,27 no yellow easy ones.     Manual Therapy Manual Therapy: Myofascial release Myofascial Release: MFR, manual stretching and scar release to right elbow to decrease pain and restrictions to increase A/PROM,  strength and reduce scar adhesions  Occupational Therapy Assessment and Plan OT Assessment and Plan Clinical Impression Statement: A: Some popping in elbow with tricep strengthening in supine but no c/o pain. Rehab Potential: Excellent OT Plan: P:  Add tband to HEP!!   Goals Home Exercise Program Pt will Perform Home Exercise Program: Independently Short Term Goals Time to Complete Short Term Goals: 4 weeks Short Term Goal 1: Adherions over athroscopy site will be softened and lengthened and not raised above the skin or painful with palpation. Short Term Goal 2: Patient will demonstrate 10 degree increased left elbow ext. and flexion to -20/130  Short Term Goal 3: Decreased pain in Left elbow with mobility 2/10 Short Term Goal 4: 20 Degree increase in AROM of Left shoulder with no pain at end range. Short Term Goal 5: Patient will be able to wash his back  Long Term Goals Time to Complete Long Term Goals: Other (comment) (6 weeks) Long Term Goal 1: Patient will return to playing pitch cornhole without pain. Long Term Goal 2: Patient will be able to return to playing golf without pain. Long Term Goal 3: Patient will regain elbow mobility to -15/135 with 1/10 pain at end range. Long Term Goal 4: 4+/5 strength in flexion and ext of elbow. Long Term Goal 5: Full functional use of Left upper extremity for reaching into cupboards.  Problem List There is no problem list on file for this patient.   End of Session Activity Tolerance: Patient tolerated treatment well General Behavior During Session: [facility] for tasks performed Cognition: [facility] for tasks performed  GO No functional reporting required   Jareli Highland L. Forrestine Lecrone, COTA/L  03/12/2011, 10:05 AM

## 2011-03-17 ENCOUNTER — Ambulatory Visit (HOSPITAL_COMMUNITY)
Admission: RE | Admit: 2011-03-17 | Discharge: 2011-03-17 | Disposition: A | Discharge status: Home / Self Care | Payer: Non-veteran care | Source: Ambulatory Visit | Attending: Thoracic Surgery | Admitting: Thoracic Surgery

## 2011-03-17 NOTE — Progress Notes (Signed)
Occupational Therapy Treatment  Patient Details  Name: Logan Hooper MRN: [id number] Date of Birth: [birth year]  Today's Date: 03/17/2011 Time: 8295-6213 Time Calculation (min): 49 min  Visit#: 9  of 12   Re-eval: 04/07/11 Manual Therapy [phone] 17' Therapeutic Exercise [phone] 31'    Subjective Symptoms/Limitations Symptoms: S:  I threw some cornhole [place] it helped loosed it up. Pain Assessment Currently in Pain?: Yes Pain Score:   1 Pain Location: Elbow  Precautions/Restrictions     Exercise/Treatments Elbow Exercises     UBE (Upper Arm Bike): 3' forward 3' reverse 3.0 Sponges: 38,45 Theraputty - Flatten: blue Theraputty - Roll: blue Theraputty - Grip: blue Hand Gripper with Small Beads: x10  Wrist Exercises     Sponges: 38,45 Theraputty - Flatten: blue Theraputty - Roll: blue Theraputty - Grip: blue Hand Gripper with Small Beads: x10  Hand Exercises Theraputty - Flatten: blue Theraputty - Roll: blue Theraputty - Grip: blue Hand Gripper with Small Beads: x10 Sponges: 38,45     Manual Therapy Manual Therapy: Myofascial release Myofascial Release: MFR, manual stretching and scar release to right elbow to decrease pain and restrictions to increase A/PROM, strength and reduce scar adhesions  Occupational Therapy Assessment and Plan OT Assessment and Plan Clinical Impression Statement: A:  See current measurements sent in letter to MD OT Plan: P:  Continue per MD recommendation.   Goals Home Exercise Program Pt will Perform Home Exercise Program: Independently Short Term Goals Time to Complete Short Term Goals: 4 weeks Short Term Goal 1: Adherions over athroscopy site will be softened and lengthened and not raised above the skin or painful with palpation. Short Term Goal 2: Patient will demonstrate 10 degree increased left elbow ext. and flexion to -20/130  Short Term Goal 3: Decreased pain in Left elbow with mobility 2/10 Short Term Goal 4: 20 Degree  increase in AROM of Left shoulder with no pain at end range. Short Term Goal 5: Patient will be able to wash his back  Long Term Goals Time to Complete Long Term Goals: Other (comment) (6 weeks) Long Term Goal 1: Patient will return to playing pitch cornhole without pain. Long Term Goal 2: Patient will be able to return to playing golf without pain. Long Term Goal 3: Patient will regain elbow mobility to -15/135 with 1/10 pain at end range. Long Term Goal 4: 4+/5 strength in flexion and ext of elbow. Long Term Goal 5: Full functional use of Left upper extremity for reaching into cupboards.  Problem List There is no problem list on file for this patient.   End of Session Activity Tolerance: Patient tolerated treatment well General Behavior During Session: [facility] for tasks performed Cognition: [facility] for tasks performed  GO No functional reporting required   Izel Hochberg L. Zawadi Aplin, COTA/L  03/17/2011, 11:58 AM

## 2011-03-19 ENCOUNTER — Ambulatory Visit (HOSPITAL_COMMUNITY)
Admission: RE | Admit: 2011-03-19 | Discharge: 2011-03-19 | Disposition: A | Discharge status: Home / Self Care | Payer: Non-veteran care | Source: Ambulatory Visit | Attending: Thoracic Surgery | Admitting: Thoracic Surgery

## 2011-03-19 NOTE — Progress Notes (Signed)
Occupational Therapy Treatment  Patient Details  Name: Logan Hooper MRN: [id number] Date of Birth: 07/15/1945  Today's Date: 03/19/2011 Time: 4098-1191 Time Calculation (min): 43 min  Visit#: 10  of 12   Re-eval: 04/07/11 Manual Therapy [phone] 18' Therapeutic Exercise [id number] 22'    Subjective Symptoms/Limitations Symptoms: S: The doctor wants my elbow to get straighter and to continue therapy  Pain Assessment Currently in Pain?: No/denies  Precautions/Restrictions     Exercise/Treatments Elbow Exercises     03/19/11 0800  Cervical Exercises  UBE (Upper Arm Bike) 3' forward 3' reverse 3.0  Elbow Exercises  Elbow Flexion PROM;10 reps;Strengthening;15 reps (weighted elbow ext stretch 2' x2 with 3#, supine )  Bar Weights/Barbell (Elbow Flexion) 3 lbs  Elbow Extension PROM;10 reps;Strengthening;15 reps  Bar Weights/Barbell (Elbow Extension) 3 lbs  Forearm Supination PROM;10 reps;Strengthening;15 reps;Bar weights/barbell (3#)  Forearm Pronation PROM;10 reps;Strengthening;15 reps;Bar weights/barbell (3#)  Additional Elbow Exercises  Theraputty - Flatten blue  Theraputty - Roll blue  Theraputty - Grip blue  Manual Therapy  Manual Therapy Myofascial release          Manual Therapy Myofascial Release: MFR, manual stretching and scar release to right elbow to decrease pain and restrictions to increase A/PROM, strength and reduce scar adhesions  Occupational Therapy Assessment and Plan OT Assessment and Plan Clinical Impression Statement: A:  Added weighted elbow ext spliny yo  OT Plan: P:  [place] per MD instruction.   Goals Home Exercise Program Pt will Perform Home Exercise Program: Independently Short Term Goals Time to Complete Short Term Goals: 4 weeks Short Term Goal 1: Adherions over athroscopy site will be softened and lengthened and not raised above the skin or painful with palpation. Short Term Goal 2: Patient will demonstrate 10 degree increased left  elbow ext. and flexion to -20/130  Short Term Goal 3: Decreased pain in Left elbow with mobility 2/10 Short Term Goal 4: 20 Degree increase in AROM of Left shoulder with no pain at end range. Short Term Goal 5: Patient will be able to wash his back  Long Term Goals Time to Complete Long Term Goals: Other (comment) (6 weeks) Long Term Goal 1: Patient will return to playing pitch cornhole without pain. Long Term Goal 2: Patient will be able to return to playing golf without pain. Long Term Goal 3: Patient will regain elbow mobility to -15/135 with 1/10 pain at end range. Long Term Goal 4: 4+/5 strength in flexion and ext of elbow. Long Term Goal 5: Full functional use of Left upper extremity for reaching into cupboards.  Problem List There is no problem list on file for this patient.   End of Session Activity Tolerance: Patient tolerated treatment well General Behavior During Session: [facility] for tasks performed Cognition: [facility] for tasks performed  GO No functional reporting required   Ameet Sandy L. Micaila Ziemba, COTA/L  03/19/2011, 12:10 PM

## 2011-03-24 ENCOUNTER — Ambulatory Visit (HOSPITAL_COMMUNITY)
Admission: RE | Admit: 2011-03-24 | Discharge: 2011-03-24 | Disposition: A | Discharge status: Home / Self Care | Payer: Non-veteran care | Source: Ambulatory Visit | Attending: Thoracic Surgery | Admitting: Thoracic Surgery

## 2011-03-24 NOTE — Progress Notes (Signed)
Occupational Therapy Treatment  Patient Details  Name: Logan Hooper MRN: [id number] Date of Birth: 11/17/1945  Today's Date: 03/24/2011 Time: 1324-4010 Time Calculation (min): 44 min  Visit#: 11  of 12   Re-eval: 04/07/11 Manual Therapy [phone] 20' Therapeutic Exercise [id number] 23'    Subjective Symptoms/Limitations Symptoms: S:  I don't know if it will ever get straighter. Pain Assessment Currently in Pain?: No/denies  Precautions/Restrictions     Exercise/Treatments Therapy Ball Flexion:  (d/c) ABduction:  (d/c) ROM / Strengthening / Isometric Strengthening UBE (Upper Arm Bike): 3' forward 3' backwards 3.5  Elbow Exercises Elbow Flexion: PROM;10 reps;Strengthening;15 reps (3' stretch with 3#) Bar Weights/Barbell (Elbow Flexion): 3 lbs Elbow Extension: PROM;10 reps;Strengthening;15 reps (3' stretch with 3#) Bar Weights/Barbell (Elbow Extension): 3 lbs Forearm Supination: PROM;10 reps;Strengthening;15 reps;Bar weights/barbell (3' stretch with) Forearm Pronation: PROM;10 reps;Strengthening;15 reps;Bar weights/barbell   UBE (Upper Arm Bike): 3' forward 3' reverse 3.0 Theraputty - Flatten: blue Theraputty - Roll: blue Theraputty - Grip: blue  Hand Exercises Theraputty - Flatten: blue Theraputty - Roll: blue Theraputty - Grip: blue     Manual Therapy Manual Therapy: Myofascial release Myofascial Release: MFR, manual stretching and scar release to right elbow to decrease pain and restrictions to increase A/PROM, strength and reduce scar adhesions.  Occupational Therapy Assessment and Plan OT Assessment and Plan Clinical Impression Statement: A:  Increased supine weighted elbow flexion, extension and supination stretch. Rehab Potential: Excellent OT Plan: P:  Add cybex press and row.   Goals Home Exercise Program Pt will Perform Home Exercise Program: Independently Short Term Goals Time to Complete Short Term Goals: 4 weeks Short Term Goal 1: Adherions over  athroscopy site will be softened and lengthened and not raised above the skin or painful with palpation. Short Term Goal 2: Patient will demonstrate 10 degree increased left elbow ext. and flexion to -20/130  Short Term Goal 3: Decreased pain in Left elbow with mobility 2/10 Short Term Goal 4: 20 Degree increase in AROM of Left shoulder with no pain at end range. Short Term Goal 5: Patient will be able to wash his back  Long Term Goals Time to Complete Long Term Goals: Other (comment) (6 weeks) Long Term Goal 1: Patient will return to playing pitch cornhole without pain. Long Term Goal 2: Patient will be able to return to playing golf without pain. Long Term Goal 3: Patient will regain elbow mobility to -15/135 with 1/10 pain at end range. Long Term Goal 4: 4+/5 strength in flexion and ext of elbow. Long Term Goal 5: Full functional use of Left upper extremity for reaching into cupboards.  Problem List There is no problem list on file for this patient.   End of Session Activity Tolerance: Patient tolerated treatment well General Behavior During Session: [facility] for tasks performed Cognition: [facility] for tasks performed  GO No functional reporting required   Jamera Vanloan L. Temesgen Weightman, COTA/L  03/24/2011, 5:45 PM

## 2011-03-26 ENCOUNTER — Ambulatory Visit (HOSPITAL_COMMUNITY)
Admission: RE | Admit: 2011-03-26 | Discharge: 2011-03-26 | Disposition: A | Discharge status: Home / Self Care | Payer: Non-veteran care | Source: Ambulatory Visit | Attending: Thoracic Surgery | Admitting: Thoracic Surgery

## 2011-03-26 NOTE — Progress Notes (Signed)
Occupational Therapy Treatment  Patient Details  Name: Logan Hooper MRN: [id number] Date of Birth: May 30, 1945  Today's Date: 03/26/2011 Time: 4098-1191 Time Calculation (min): 36 min  Visit#: 12  of 20   Re-eval: 04/07/11 Manual Therapy [phone] 14' Therapeutic Exercise [id number] 20'    Subjective Symptoms/Limitations Symptoms: S:  If I stretch it all the way out it hurts.  I don't think it will ever go away.  Precautions/Restrictions     Exercise/Treatments ROM / Strengthening / Isometric Strengthening Cybex Press: 1 plate;15 reps Cybex Row: 15 reps;1 plate    Elbow Exercises Elbow Flexion: PROM;10 reps;Strengthening;15 reps Bar Weights/Barbell (Elbow Flexion): 3 lbs Elbow Extension: PROM;10 reps;Strengthening;15 reps Bar Weights/Barbell (Elbow Extension): 3 lbs Forearm Supination: PROM;10 reps;Strengthening;15 reps;Bar weights/barbell (3#) Forearm Pronation: PROM;10 reps;Strengthening;15 reps;Bar weights/barbell (3#)   UBE (Upper Arm Bike): 3' forward 3' reverse 3.0 Sponges: 38,45 Theraputty - Flatten: blue Theraputty - Roll: blue Theraputty - Grip: blue        Occupational Therapy Assessment and Plan OT Assessment and Plan Clinical Impression Statement: A:  Added cybex press and row. Rehab Potential: Excellent OT Plan: P:  increase weight on cybex   Goals Home Exercise Program Pt will Perform Home Exercise Program: Independently Short Term Goals Time to Complete Short Term Goals: 4 weeks Short Term Goal 1: Adherions over athroscopy site will be softened and lengthened and not raised above the skin or painful with palpation. Short Term Goal 2: Patient will demonstrate 10 degree increased left elbow ext. and flexion to -20/130  Short Term Goal 3: Decreased pain in Left elbow with mobility 2/10 Short Term Goal 4: 20 Degree increase in AROM of Left shoulder with no pain at end range. Short Term Goal 5: Patient will be able to wash his back  Long Term  Goals Time to Complete Long Term Goals: Other (comment) (6 weeks) Long Term Goal 1: Patient will return to playing pitch cornhole without pain. Long Term Goal 2: Patient will be able to return to playing golf without pain. Long Term Goal 3: Patient will regain elbow mobility to -15/135 with 1/10 pain at end range. Long Term Goal 4: 4+/5 strength in flexion and ext of elbow. Long Term Goal 5: Full functional use of Left upper extremity for reaching into cupboards.  Problem List There is no problem list on file for this patient.   End of Session Activity Tolerance: Patient tolerated treatment well General Behavior During Session: [facility] for tasks performed Cognition: [facility] for tasks performed  GO No functional reporting required   Taylin Mans L. Malayia Spizzirri, COTA/L  03/26/2011, 10:15 AM

## 2011-04-09 DIAGNOSIS — M47817 Spondylosis without myelopathy or radiculopathy, lumbosacral region: Secondary | ICD-10-CM | POA: Diagnosis not present

## 2011-04-16 DIAGNOSIS — M51379 Other intervertebral disc degeneration, lumbosacral region without mention of lumbar back pain or lower extremity pain: Secondary | ICD-10-CM | POA: Diagnosis not present

## 2011-04-16 DIAGNOSIS — M5126 Other intervertebral disc displacement, lumbar region: Secondary | ICD-10-CM | POA: Diagnosis not present

## 2011-04-16 DIAGNOSIS — M47817 Spondylosis without myelopathy or radiculopathy, lumbosacral region: Secondary | ICD-10-CM | POA: Diagnosis not present

## 2011-04-16 DIAGNOSIS — M5137 Other intervertebral disc degeneration, lumbosacral region: Secondary | ICD-10-CM | POA: Diagnosis not present

## 2011-04-24 DIAGNOSIS — M47817 Spondylosis without myelopathy or radiculopathy, lumbosacral region: Secondary | ICD-10-CM | POA: Diagnosis not present

## 2011-04-30 DIAGNOSIS — M5380 Other specified dorsopathies, site unspecified: Secondary | ICD-10-CM | POA: Diagnosis not present

## 2011-04-30 DIAGNOSIS — M47817 Spondylosis without myelopathy or radiculopathy, lumbosacral region: Secondary | ICD-10-CM | POA: Diagnosis not present

## 2011-05-12 DIAGNOSIS — M5380 Other specified dorsopathies, site unspecified: Secondary | ICD-10-CM | POA: Diagnosis not present

## 2011-05-12 DIAGNOSIS — E119 Type 2 diabetes mellitus without complications: Secondary | ICD-10-CM | POA: Diagnosis not present

## 2011-05-12 DIAGNOSIS — Z7982 Long term (current) use of aspirin: Secondary | ICD-10-CM | POA: Diagnosis not present

## 2011-05-12 DIAGNOSIS — M5137 Other intervertebral disc degeneration, lumbosacral region: Secondary | ICD-10-CM | POA: Diagnosis not present

## 2011-05-12 DIAGNOSIS — E7800 Pure hypercholesterolemia, unspecified: Secondary | ICD-10-CM | POA: Diagnosis not present

## 2011-05-12 DIAGNOSIS — I10 Essential (primary) hypertension: Secondary | ICD-10-CM | POA: Diagnosis not present

## 2011-05-12 DIAGNOSIS — Z79899 Other long term (current) drug therapy: Secondary | ICD-10-CM | POA: Diagnosis not present

## 2011-05-12 DIAGNOSIS — IMO0001 Reserved for inherently not codable concepts without codable children: Secondary | ICD-10-CM | POA: Diagnosis not present

## 2011-05-12 DIAGNOSIS — J438 Other emphysema: Secondary | ICD-10-CM | POA: Diagnosis not present

## 2011-05-25 DIAGNOSIS — M47817 Spondylosis without myelopathy or radiculopathy, lumbosacral region: Secondary | ICD-10-CM | POA: Diagnosis not present

## 2011-05-25 DIAGNOSIS — M5380 Other specified dorsopathies, site unspecified: Secondary | ICD-10-CM | POA: Diagnosis not present

## 2011-07-02 ENCOUNTER — Other Ambulatory Visit: Payer: Self-pay | Admitting: Family Medicine

## 2011-07-02 ENCOUNTER — Ambulatory Visit (HOSPITAL_COMMUNITY)
Admission: RE | Admit: 2011-07-02 | Discharge: 2011-07-02 | Disposition: A | Discharge status: Home / Self Care | Payer: Medicare Other | Source: Ambulatory Visit | Attending: Family Medicine | Admitting: Family Medicine

## 2011-07-02 DIAGNOSIS — E119 Type 2 diabetes mellitus without complications: Secondary | ICD-10-CM | POA: Diagnosis not present

## 2011-07-02 DIAGNOSIS — M25469 Effusion, unspecified knee: Secondary | ICD-10-CM | POA: Diagnosis not present

## 2011-07-02 DIAGNOSIS — IMO0002 Reserved for concepts with insufficient information to code with codable children: Secondary | ICD-10-CM | POA: Diagnosis not present

## 2011-07-02 DIAGNOSIS — M25569 Pain in unspecified knee: Secondary | ICD-10-CM | POA: Diagnosis not present

## 2011-07-02 DIAGNOSIS — M1710 Unilateral primary osteoarthritis, unspecified knee: Secondary | ICD-10-CM | POA: Diagnosis not present

## 2011-07-02 DIAGNOSIS — M25562 Pain in left knee: Secondary | ICD-10-CM

## 2011-07-02 DIAGNOSIS — M129 Arthropathy, unspecified: Secondary | ICD-10-CM | POA: Diagnosis not present

## 2011-07-13 DIAGNOSIS — L570 Actinic keratosis: Secondary | ICD-10-CM | POA: Diagnosis not present

## 2011-07-13 DIAGNOSIS — D235 Other benign neoplasm of skin of trunk: Secondary | ICD-10-CM | POA: Diagnosis not present

## 2011-07-13 DIAGNOSIS — D2370 Other benign neoplasm of skin of unspecified lower limb, including hip: Secondary | ICD-10-CM | POA: Diagnosis not present

## 2011-07-21 DIAGNOSIS — M549 Dorsalgia, unspecified: Secondary | ICD-10-CM | POA: Diagnosis not present

## 2011-07-21 DIAGNOSIS — Z9889 Other specified postprocedural states: Secondary | ICD-10-CM | POA: Diagnosis not present

## 2011-07-21 DIAGNOSIS — Z7982 Long term (current) use of aspirin: Secondary | ICD-10-CM | POA: Diagnosis not present

## 2011-07-21 DIAGNOSIS — I10 Essential (primary) hypertension: Secondary | ICD-10-CM | POA: Diagnosis not present

## 2011-07-21 DIAGNOSIS — E119 Type 2 diabetes mellitus without complications: Secondary | ICD-10-CM | POA: Diagnosis not present

## 2011-07-21 DIAGNOSIS — E785 Hyperlipidemia, unspecified: Secondary | ICD-10-CM | POA: Diagnosis not present

## 2011-07-21 DIAGNOSIS — Z794 Long term (current) use of insulin: Secondary | ICD-10-CM | POA: Diagnosis not present

## 2011-07-21 DIAGNOSIS — Z833 Family history of diabetes mellitus: Secondary | ICD-10-CM | POA: Diagnosis not present

## 2011-07-21 DIAGNOSIS — Z8249 Family history of ischemic heart disease and other diseases of the circulatory system: Secondary | ICD-10-CM | POA: Diagnosis not present

## 2011-07-21 DIAGNOSIS — M47817 Spondylosis without myelopathy or radiculopathy, lumbosacral region: Secondary | ICD-10-CM | POA: Diagnosis not present

## 2011-07-21 DIAGNOSIS — Z79899 Other long term (current) drug therapy: Secondary | ICD-10-CM | POA: Diagnosis not present

## 2011-07-21 DIAGNOSIS — Z8489 Family history of other specified conditions: Secondary | ICD-10-CM | POA: Diagnosis not present

## 2011-07-28 DIAGNOSIS — Z981 Arthrodesis status: Secondary | ICD-10-CM | POA: Diagnosis not present

## 2011-07-28 DIAGNOSIS — M47817 Spondylosis without myelopathy or radiculopathy, lumbosacral region: Secondary | ICD-10-CM | POA: Diagnosis not present

## 2011-08-11 ENCOUNTER — Other Ambulatory Visit (HOSPITAL_COMMUNITY): Payer: Self-pay | Admitting: Neurosurgery

## 2011-08-11 DIAGNOSIS — E119 Type 2 diabetes mellitus without complications: Secondary | ICD-10-CM | POA: Diagnosis not present

## 2011-08-11 DIAGNOSIS — I10 Essential (primary) hypertension: Secondary | ICD-10-CM | POA: Diagnosis not present

## 2011-08-11 DIAGNOSIS — R609 Edema, unspecified: Secondary | ICD-10-CM

## 2011-08-11 DIAGNOSIS — G5790 Unspecified mononeuropathy of unspecified lower limb: Secondary | ICD-10-CM | POA: Diagnosis not present

## 2011-08-14 ENCOUNTER — Ambulatory Visit (HOSPITAL_COMMUNITY)
Admission: RE | Admit: 2011-08-14 | Discharge: 2011-08-14 | Disposition: A | Discharge status: Home / Self Care | Payer: Medicare Other | Source: Ambulatory Visit | Attending: Neurosurgery | Admitting: Neurosurgery

## 2011-08-14 ENCOUNTER — Other Ambulatory Visit (HOSPITAL_COMMUNITY): Payer: Self-pay | Admitting: Neurosurgery

## 2011-08-14 DIAGNOSIS — M7989 Other specified soft tissue disorders: Secondary | ICD-10-CM | POA: Insufficient documentation

## 2011-08-14 DIAGNOSIS — R609 Edema, unspecified: Secondary | ICD-10-CM

## 2011-08-14 DIAGNOSIS — M7120 Synovial cyst of popliteal space [Baker], unspecified knee: Secondary | ICD-10-CM | POA: Diagnosis not present

## 2011-08-27 ENCOUNTER — Other Ambulatory Visit (HOSPITAL_COMMUNITY): Payer: Self-pay | Admitting: *Deleted

## 2011-08-27 ENCOUNTER — Other Ambulatory Visit (HOSPITAL_COMMUNITY): Payer: Self-pay | Admitting: Family Medicine

## 2011-08-27 DIAGNOSIS — M25562 Pain in left knee: Secondary | ICD-10-CM

## 2011-08-27 DIAGNOSIS — M25569 Pain in unspecified knee: Secondary | ICD-10-CM | POA: Diagnosis not present

## 2011-08-27 DIAGNOSIS — M1710 Unilateral primary osteoarthritis, unspecified knee: Secondary | ICD-10-CM | POA: Diagnosis not present

## 2011-08-31 ENCOUNTER — Ambulatory Visit (HOSPITAL_COMMUNITY)
Admission: RE | Admit: 2011-08-31 | Discharge: 2011-08-31 | Disposition: A | Discharge status: Home / Self Care | Payer: Medicare Other | Source: Ambulatory Visit | Attending: Family Medicine | Admitting: Family Medicine

## 2011-08-31 DIAGNOSIS — R937 Abnormal findings on diagnostic imaging of other parts of musculoskeletal system: Secondary | ICD-10-CM | POA: Diagnosis not present

## 2011-08-31 DIAGNOSIS — M25562 Pain in left knee: Secondary | ICD-10-CM

## 2011-08-31 DIAGNOSIS — M7120 Synovial cyst of popliteal space [Baker], unspecified knee: Secondary | ICD-10-CM | POA: Insufficient documentation

## 2011-08-31 DIAGNOSIS — IMO0002 Reserved for concepts with insufficient information to code with codable children: Secondary | ICD-10-CM | POA: Insufficient documentation

## 2011-08-31 DIAGNOSIS — M25569 Pain in unspecified knee: Secondary | ICD-10-CM | POA: Diagnosis not present

## 2011-08-31 DIAGNOSIS — M1710 Unilateral primary osteoarthritis, unspecified knee: Secondary | ICD-10-CM | POA: Insufficient documentation

## 2011-09-03 DIAGNOSIS — M2340 Loose body in knee, unspecified knee: Secondary | ICD-10-CM | POA: Diagnosis not present

## 2011-09-03 DIAGNOSIS — M9420 Chondromalacia, unspecified site: Secondary | ICD-10-CM | POA: Diagnosis not present

## 2011-09-28 DIAGNOSIS — M2340 Loose body in knee, unspecified knee: Secondary | ICD-10-CM | POA: Diagnosis not present

## 2011-09-28 DIAGNOSIS — M1710 Unilateral primary osteoarthritis, unspecified knee: Secondary | ICD-10-CM | POA: Diagnosis not present

## 2011-09-28 DIAGNOSIS — M6750 Plica syndrome, unspecified knee: Secondary | ICD-10-CM | POA: Diagnosis not present

## 2011-09-28 DIAGNOSIS — M9420 Chondromalacia, unspecified site: Secondary | ICD-10-CM | POA: Diagnosis not present

## 2011-09-28 DIAGNOSIS — M2240 Chondromalacia patellae, unspecified knee: Secondary | ICD-10-CM | POA: Diagnosis not present

## 2011-09-28 DIAGNOSIS — M25569 Pain in unspecified knee: Secondary | ICD-10-CM | POA: Diagnosis not present

## 2011-10-14 DIAGNOSIS — M545 Low back pain, unspecified: Secondary | ICD-10-CM | POA: Diagnosis not present

## 2011-10-15 DIAGNOSIS — M545 Low back pain, unspecified: Secondary | ICD-10-CM | POA: Diagnosis not present

## 2011-10-19 DIAGNOSIS — M545 Low back pain, unspecified: Secondary | ICD-10-CM | POA: Diagnosis not present

## 2011-10-21 DIAGNOSIS — M545 Low back pain, unspecified: Secondary | ICD-10-CM | POA: Diagnosis not present

## 2011-10-27 DIAGNOSIS — M545 Low back pain, unspecified: Secondary | ICD-10-CM | POA: Diagnosis not present

## 2011-10-28 DIAGNOSIS — M545 Low back pain, unspecified: Secondary | ICD-10-CM | POA: Diagnosis not present

## 2011-11-04 DIAGNOSIS — M545 Low back pain, unspecified: Secondary | ICD-10-CM | POA: Diagnosis not present

## 2011-11-06 DIAGNOSIS — M545 Low back pain, unspecified: Secondary | ICD-10-CM | POA: Diagnosis not present

## 2011-11-10 DIAGNOSIS — Z23 Encounter for immunization: Secondary | ICD-10-CM | POA: Diagnosis not present

## 2011-11-10 DIAGNOSIS — E119 Type 2 diabetes mellitus without complications: Secondary | ICD-10-CM | POA: Diagnosis not present

## 2011-11-10 DIAGNOSIS — M545 Low back pain, unspecified: Secondary | ICD-10-CM | POA: Diagnosis not present

## 2011-11-10 DIAGNOSIS — I10 Essential (primary) hypertension: Secondary | ICD-10-CM | POA: Diagnosis not present

## 2011-11-10 DIAGNOSIS — E785 Hyperlipidemia, unspecified: Secondary | ICD-10-CM | POA: Diagnosis not present

## 2011-11-12 DIAGNOSIS — M545 Low back pain, unspecified: Secondary | ICD-10-CM | POA: Diagnosis not present

## 2011-11-13 DIAGNOSIS — E782 Mixed hyperlipidemia: Secondary | ICD-10-CM | POA: Diagnosis not present

## 2011-11-13 DIAGNOSIS — Z79899 Other long term (current) drug therapy: Secondary | ICD-10-CM | POA: Diagnosis not present

## 2011-11-13 DIAGNOSIS — Z125 Encounter for screening for malignant neoplasm of prostate: Secondary | ICD-10-CM | POA: Diagnosis not present

## 2011-11-17 DIAGNOSIS — M545 Low back pain, unspecified: Secondary | ICD-10-CM | POA: Diagnosis not present

## 2011-11-19 DIAGNOSIS — M545 Low back pain, unspecified: Secondary | ICD-10-CM | POA: Diagnosis not present

## 2011-11-23 DIAGNOSIS — M47817 Spondylosis without myelopathy or radiculopathy, lumbosacral region: Secondary | ICD-10-CM | POA: Diagnosis not present

## 2011-11-25 DIAGNOSIS — M545 Low back pain, unspecified: Secondary | ICD-10-CM | POA: Diagnosis not present

## 2011-12-01 DIAGNOSIS — M545 Low back pain, unspecified: Secondary | ICD-10-CM | POA: Diagnosis not present

## 2011-12-08 DIAGNOSIS — M545 Low back pain, unspecified: Secondary | ICD-10-CM | POA: Diagnosis not present

## 2011-12-11 DIAGNOSIS — M545 Low back pain, unspecified: Secondary | ICD-10-CM | POA: Diagnosis not present

## 2011-12-15 DIAGNOSIS — M545 Low back pain, unspecified: Secondary | ICD-10-CM | POA: Diagnosis not present

## 2011-12-18 DIAGNOSIS — M545 Low back pain, unspecified: Secondary | ICD-10-CM | POA: Diagnosis not present

## 2011-12-22 DIAGNOSIS — M545 Low back pain, unspecified: Secondary | ICD-10-CM | POA: Diagnosis not present

## 2011-12-25 DIAGNOSIS — M545 Low back pain, unspecified: Secondary | ICD-10-CM | POA: Diagnosis not present

## 2012-01-25 DIAGNOSIS — M47817 Spondylosis without myelopathy or radiculopathy, lumbosacral region: Secondary | ICD-10-CM | POA: Diagnosis not present

## 2012-02-16 DIAGNOSIS — M129 Arthropathy, unspecified: Secondary | ICD-10-CM | POA: Diagnosis not present

## 2012-02-16 DIAGNOSIS — I10 Essential (primary) hypertension: Secondary | ICD-10-CM | POA: Diagnosis not present

## 2012-02-16 DIAGNOSIS — G5790 Unspecified mononeuropathy of unspecified lower limb: Secondary | ICD-10-CM | POA: Diagnosis not present

## 2012-02-16 DIAGNOSIS — E119 Type 2 diabetes mellitus without complications: Secondary | ICD-10-CM | POA: Diagnosis not present

## 2012-05-11 ENCOUNTER — Encounter: Payer: Self-pay | Admitting: *Deleted

## 2012-05-12 DIAGNOSIS — M47817 Spondylosis without myelopathy or radiculopathy, lumbosacral region: Secondary | ICD-10-CM | POA: Diagnosis not present

## 2012-05-17 ENCOUNTER — Telehealth: Payer: Self-pay | Admitting: Family Medicine

## 2012-05-17 ENCOUNTER — Encounter: Payer: Self-pay | Admitting: Family Medicine

## 2012-05-17 ENCOUNTER — Ambulatory Visit (INDEPENDENT_AMBULATORY_CARE_PROVIDER_SITE_OTHER): Payer: Medicare Other | Admitting: Family Medicine

## 2012-05-17 VITALS — BP 126/70 | HR 60 | Ht 70.25 in | Wt 259.1 lb

## 2012-05-17 DIAGNOSIS — E119 Type 2 diabetes mellitus without complications: Secondary | ICD-10-CM | POA: Diagnosis not present

## 2012-05-17 DIAGNOSIS — E785 Hyperlipidemia, unspecified: Secondary | ICD-10-CM

## 2012-05-17 DIAGNOSIS — G4700 Insomnia, unspecified: Secondary | ICD-10-CM

## 2012-05-17 DIAGNOSIS — E782 Mixed hyperlipidemia: Secondary | ICD-10-CM

## 2012-05-17 DIAGNOSIS — Z79899 Other long term (current) drug therapy: Secondary | ICD-10-CM

## 2012-05-17 DIAGNOSIS — I10 Essential (primary) hypertension: Secondary | ICD-10-CM

## 2012-05-17 NOTE — Patient Instructions (Addendum)
Please obtain last colonoscopy report arranged through the [place].  Please obtain date of last pneumonia vaccine.  Please stop the lisinopril because it does virtually the same thing as the losartan.

## 2012-05-17 NOTE — Telephone Encounter (Signed)
Yes, we'll do pneum shot next visit, and we'll have him sign for colon report

## 2012-05-17 NOTE — Progress Notes (Signed)
Get up and Go Test- Patient reported one fall in pass 6 months. Assessment completed and passed. Mini Cog Test- passed.

## 2012-05-17 NOTE — Progress Notes (Signed)
  Subjective:    Patient ID: Logan Hooper, male    DOB: 1945-02-26, 67 y.o.   MRN: [id number]  HPI Patient states overall doing relatively well. Last colonoscopy under 10 years. Was told could wait 10 years then. Last pneumonia shot 5 years ago. Trying to stay busy, but not exercising as much as he would hope. Claims compliance with his usual medications. When he checks his sugars his numbers are generally in good control The last time we saw him we stopped his lisinopril and had and main chain losartan. The patient actually stayed on both medications.  Results for orders placed in visit on 05/17/12  POCT GLYCOSYLATED HEMOGLOBIN (HGB A1C)      Result Value Range   Hemoglobin A1C 7.6      Review of Systems  Constitutional: Negative for fever, activity change and appetite change.  HENT: Negative for congestion, rhinorrhea and neck pain.   Eyes: Negative for discharge.  Respiratory: Negative for cough and wheezing.   Cardiovascular: Negative for chest pain.  Gastrointestinal: Negative for vomiting, abdominal pain and blood in stool.  Genitourinary: Negative for frequency and difficulty urinating.  Skin: Negative for rash.  Allergic/Immunologic: Negative for environmental allergies and food allergies.  Neurological: Negative for weakness and headaches.  Psychiatric/Behavioral: Negative for agitation.       Objective:   Physical Exam  Vitals reviewed. Constitutional: He appears well-developed and well-nourished.  HENT:  Head: Normocephalic and atraumatic.  Right Ear: External ear normal.  Left Ear: External ear normal.  Nose: Nose normal.  Mouth/Throat: Oropharynx is clear and moist.  Eyes: EOM are normal. Pupils are equal, round, and reactive to light.  Neck: Normal range of motion. Neck supple. No thyromegaly present.  Cardiovascular: Normal rate, regular rhythm and normal heart sounds.   No murmur heard. Pulmonary/Chest: Effort normal and breath sounds normal. No  respiratory distress. He has no wheezes.  Abdominal: Soft. Bowel sounds are normal. He exhibits no distension and no mass. There is no tenderness.  Genitourinary: Prostate normal and penis normal.  Musculoskeletal: Normal range of motion. He exhibits no edema.  Lymphadenopathy:    He has no cervical adenopathy.  Neurological: He is alert. He exhibits normal muscle tone.  Skin: Skin is warm and dry. No erythema.  Psychiatric: He has a normal mood and affect. His behavior is normal. Judgment normal.          Assessment & Plan:  Impression wellness exam. #2 type 2 diabetes control decent at 7.6. #3 hypertension good control. However redundant medication with both lisinopril and losartan-discussed. #4 up-to-date on colonoscopy. Plan hold lisinopril. Diet exercise discussed. Maintain other meds. Recheck in one month, may need additional blood pressure medicine. Last pneumonia shot 2008 can get pneumonia shot  also.

## 2012-05-17 NOTE — Telephone Encounter (Signed)
Pt PNA shot was 08/13/2004 and colonoscopy done in 2011 Pt would have to drive to [place] to get this in written form, wants to know if this verbal info is good enough?

## 2012-05-17 NOTE — Telephone Encounter (Signed)
Discussed with patient

## 2012-05-19 DIAGNOSIS — E782 Mixed hyperlipidemia: Secondary | ICD-10-CM | POA: Diagnosis not present

## 2012-05-19 DIAGNOSIS — Z79899 Other long term (current) drug therapy: Secondary | ICD-10-CM | POA: Diagnosis not present

## 2012-05-19 LAB — HEPATIC FUNCTION PANEL
Bilirubin, Direct: 0.1 mg/dL (ref 0.0–0.3)
Indirect Bilirubin: 0.4 mg/dL (ref 0.0–0.9)
Total Bilirubin: 0.5 mg/dL (ref 0.3–1.2)

## 2012-05-19 LAB — LIPID PANEL
LDL Cholesterol: 83 mg/dL (ref 0–99)
Total CHOL/HDL Ratio: 4.1 Ratio
VLDL: 26 mg/dL (ref 0–40)

## 2012-05-20 DIAGNOSIS — E785 Hyperlipidemia, unspecified: Secondary | ICD-10-CM | POA: Insufficient documentation

## 2012-05-20 DIAGNOSIS — G4700 Insomnia, unspecified: Secondary | ICD-10-CM | POA: Insufficient documentation

## 2012-05-20 DIAGNOSIS — E1143 Type 2 diabetes mellitus with diabetic autonomic (poly)neuropathy: Secondary | ICD-10-CM | POA: Insufficient documentation

## 2012-05-20 DIAGNOSIS — I10 Essential (primary) hypertension: Secondary | ICD-10-CM | POA: Insufficient documentation

## 2012-05-22 ENCOUNTER — Encounter: Payer: Self-pay | Admitting: Family Medicine

## 2012-06-14 ENCOUNTER — Ambulatory Visit (INDEPENDENT_AMBULATORY_CARE_PROVIDER_SITE_OTHER): Payer: Medicare Other | Admitting: Family Medicine

## 2012-06-14 ENCOUNTER — Encounter: Payer: Self-pay | Admitting: Family Medicine

## 2012-06-14 VITALS — BP 124/90 | HR 80 | Ht 71.0 in | Wt 268.0 lb

## 2012-06-14 DIAGNOSIS — I10 Essential (primary) hypertension: Secondary | ICD-10-CM

## 2012-06-14 DIAGNOSIS — R079 Chest pain, unspecified: Secondary | ICD-10-CM | POA: Diagnosis not present

## 2012-06-14 MED ORDER — DICLOFENAC SODIUM 75 MG PO TBEC: 75.0000 mg | DELAYED_RELEASE_TABLET | Freq: Two times a day (BID) | ORAL | Status: DC

## 2012-06-14 NOTE — Patient Instructions (Signed)
This is chest wall pain. Doctors call it costochondritis, which stand for inflammation of a patch of chest wall. Over half the time, no obvious injury

## 2012-06-14 NOTE — Progress Notes (Signed)
  Subjective:    Patient ID: Logan Hooper, male    DOB: 04/16/1945, 67 y.o.   MRN: [id number]  Hypertension The current episode started more than 1 year ago. The problem is unchanged. The problem is controlled. Associated symptoms include chest pain. Pertinent negatives include no anxiety or malaise/fatigue. There are no associated agents to hypertension. There are no known risk factors for coronary artery disease. Past treatments include angiotensin blockers and calcium channel blockers. The current treatment provides moderate improvement. There are no compliance problems.  There is no history of angina or CAD/MI.   Cp sharp. Worse with deep breath worse with certain motions. Five days dur. No cough. No fever. Pain concerning patient. No reflux symptoms. Very sharp at times. No shortness of breath.  Patient reports progressive difficulty hearing. Wonders how his ears look. History of earwax. History of loud sound exposure. WSL   Review of Systems  Constitutional: Negative for malaise/fatigue.  Cardiovascular: Positive for chest pain.   ROS otherwise negative.     Objective:   Physical Exam  Alert no acute distress TMs no wax appeared normal neck supple lungs clear. Heart regular in rhythm. Feet without edema. Blood pressure repeat 124/86. Abdomen benign. Right lateral chest wall tender to palpation. Very sharp in nature.      Assessment & Plan:  Impression #1 costochondritis discussed at great length. #2 hypertension controlled good despite having stopped the lisinopril. #3 progressive hearing difficulties significant. No evidence of wax discussed. Plan maintain same medications. Diet exercise discussed. Recheck in several months. WSL

## 2012-09-12 ENCOUNTER — Ambulatory Visit (INDEPENDENT_AMBULATORY_CARE_PROVIDER_SITE_OTHER): Payer: Medicare Other | Admitting: Family Medicine

## 2012-09-12 ENCOUNTER — Encounter: Payer: Self-pay | Admitting: Family Medicine

## 2012-09-12 VITALS — BP 150/82 | Ht 69.0 in | Wt 262.4 lb

## 2012-09-12 DIAGNOSIS — I10 Essential (primary) hypertension: Secondary | ICD-10-CM

## 2012-09-12 DIAGNOSIS — E785 Hyperlipidemia, unspecified: Secondary | ICD-10-CM | POA: Diagnosis not present

## 2012-09-12 DIAGNOSIS — E119 Type 2 diabetes mellitus without complications: Secondary | ICD-10-CM

## 2012-09-12 DIAGNOSIS — G4700 Insomnia, unspecified: Secondary | ICD-10-CM | POA: Diagnosis not present

## 2012-09-12 LAB — POCT GLYCOSYLATED HEMOGLOBIN (HGB A1C): Hemoglobin A1C: 6.8

## 2012-09-12 NOTE — Patient Instructions (Signed)
Please bring all meds in every visit  Please have them send [place] a copy of your office visit so we can know what they are doing and why.

## 2012-09-12 NOTE — Progress Notes (Signed)
  Subjective:    Patient ID: Logan Hooper, male    DOB: 05/31/1945, 67 y.o.   MRN: [id number]  Diabetes He has type 2 diabetes mellitus. Pertinent negatives for hypoglycemia include no dizziness or nervousness/anxiousness. Pertinent negatives for diabetes include no blurred vision, no chest pain and no fatigue. Symptoms are improving. There are no known risk factors for coronary artery disease. He is compliant with treatment most of the time. He is following a generally healthy diet. Meal planning includes avoidance of concentrated sweets. He participates in exercise intermittently. His breakfast blood glucose is taken between 7-8 am. His breakfast blood glucose range is generally 110-130 mg/dl.   Here today for check up.   No concerns.   Results for orders placed in visit on 09/12/12  POCT GLYCOSYLATED HEMOGLOBIN (HGB A1C)      Result Value Range   Hemoglobin A1C 6.8     Current levemir patient is using as sliding scale with each meal. He reports that his morning sugars overall are considerably better.  Patient claims compliance with his blood pressure medicine. He was started back on lisinopril even though he is  on losartan. This occurred by his primary care doctor in the [place] system. This also occurred despite the fact that I explained to the patient earlier in the year why he should not be on both medications.   Review of Systems  Constitutional: Negative for fatigue.  Eyes: Negative for blurred vision.  Cardiovascular: Negative for chest pain.  Neurological: Negative for dizziness.  Psychiatric/Behavioral: The patient is not nervous/anxious.        Objective:   Physical Exam Alert no acute distress. Lungs clear. Heart regular rate and rhythm. HEENT normal. Feet without edema sensation intact pulses good.       Assessment & Plan:  Impression 1 type 2 diabetes A1c good at 6.8%. #2 hypertension decent control. #3 medication discussion. I have advised the patient that years ago  some of the experts recommended both ACE inhibitors and angiotensin blockers, though now that is considered wrong. Patient's response is that he has to get his free medication from the [place] so that he has to listen to them. Plan diet exercise discussed. 25 minutes spent mostly in discussion. Recheck in 6 months. Encouraged to stop lisinopril once again. May well need further blood pressure medication, but he does not warrant both losartan and lisinopril. WSL

## 2012-09-25 IMAGING — US US EXTREM LOW VENOUS*L*
1 series · 14 of 24 positions shown · non-contrast
Comparison: None

CLINICAL DATA: leg swelling /r/o dvt;;

LEFT LOWER EXTREMITY VENOUS DUPLEX ULTRASOUND
TECHNIQUE: Gray-scale sonography with compression, as well as color
and duplex ultrasound, were performed to evaluate the deep venous
system from the level of the common femoral vein through the
popliteal and proximal calf veins.

[Series 1: us extrem low venous*left* · 0.09mm/px · 14 of 38 slices shown]
[im 1/38]
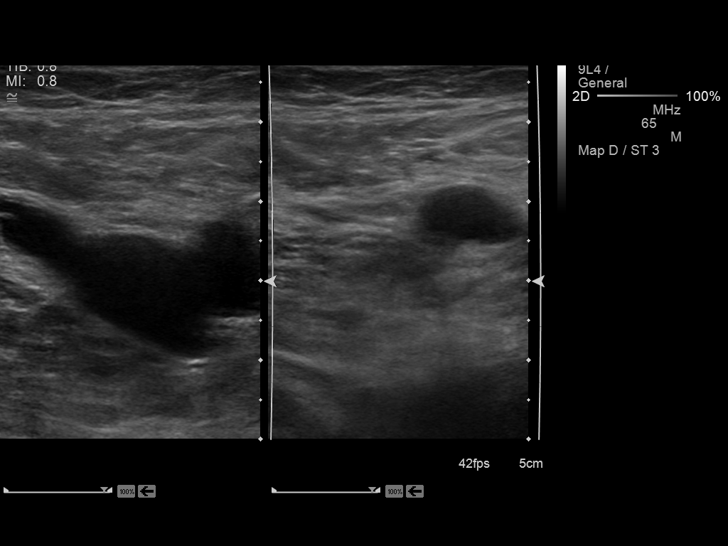
[im 4/38]
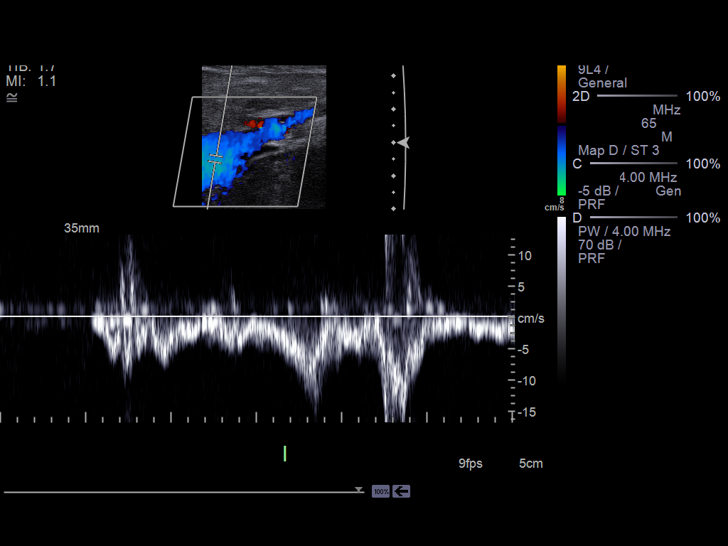
[im 7/38]
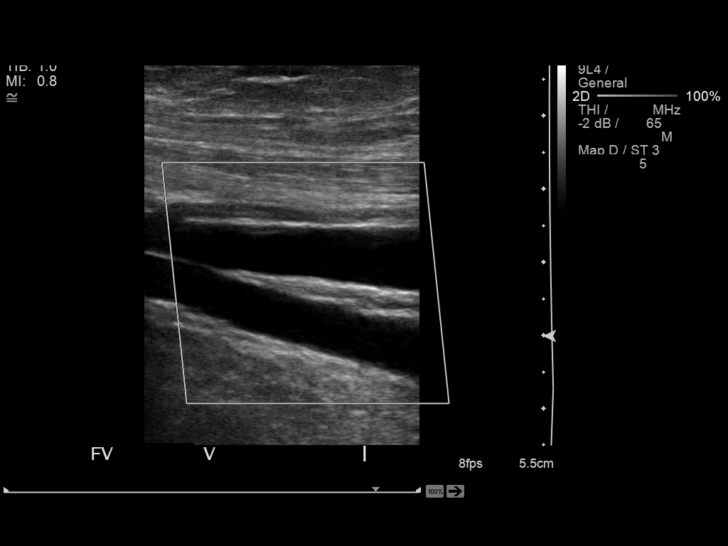
[im 10/38]
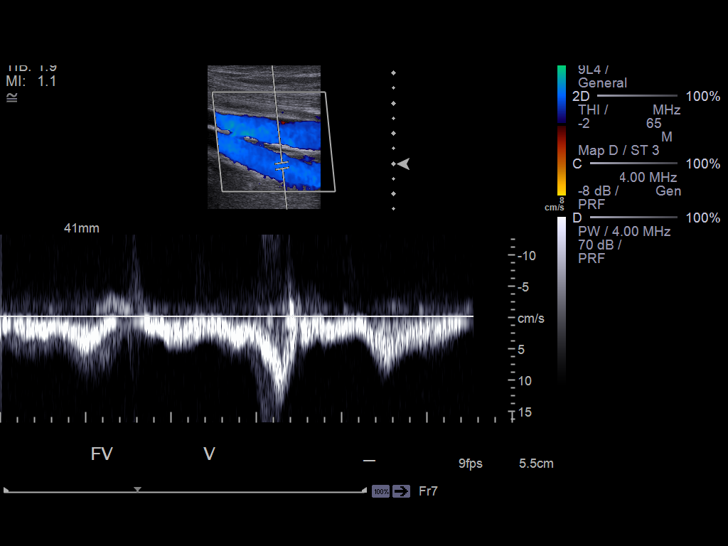
[im 12/38]
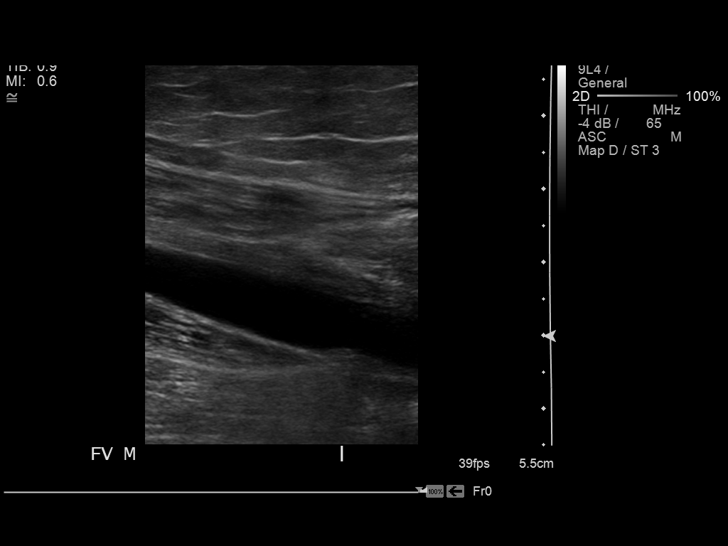
[im 15/38]
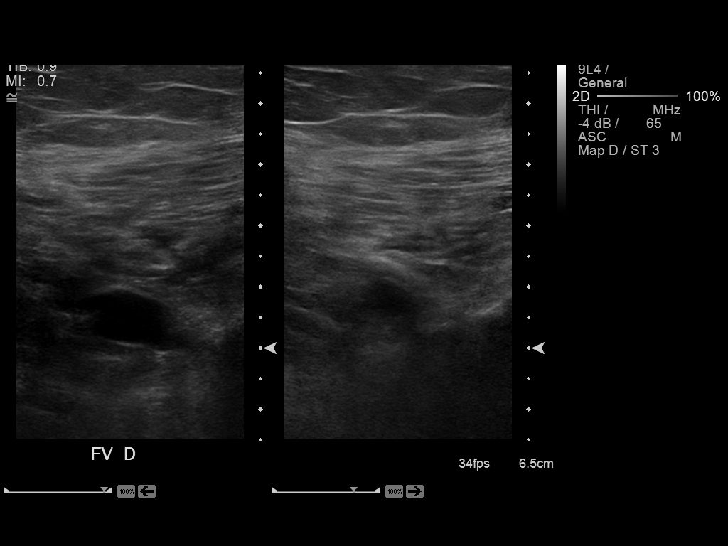
[im 18/38]
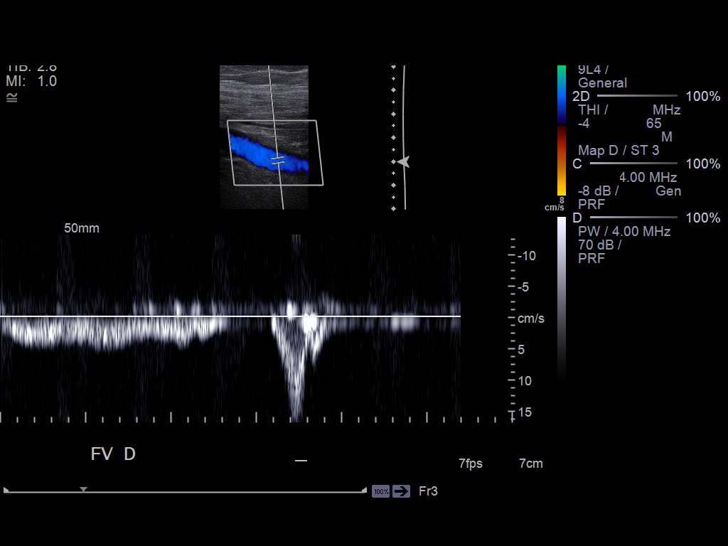
[im 20/38]
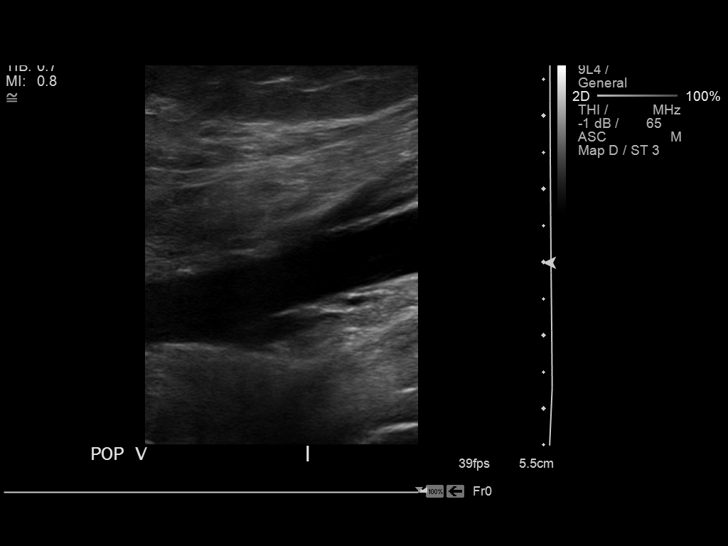
[im 23/38]
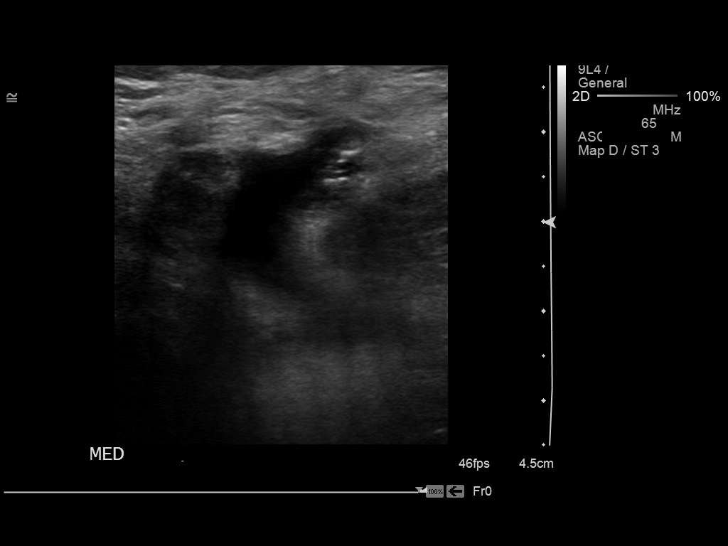
[im 26/38]
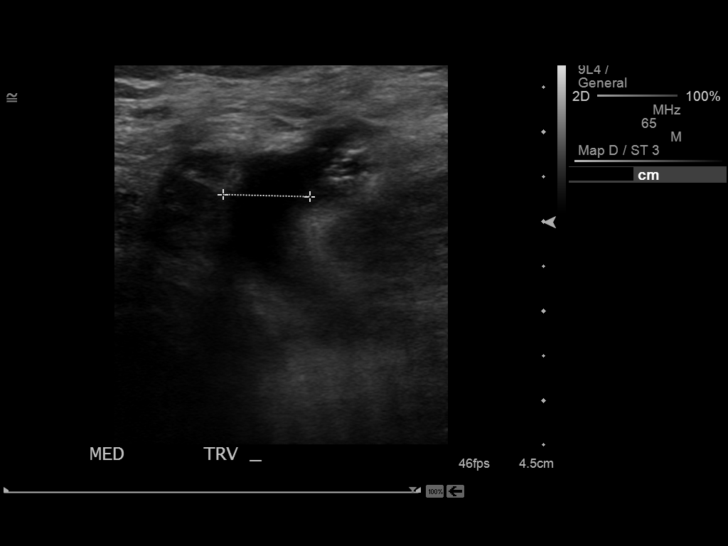
[im 29/38]
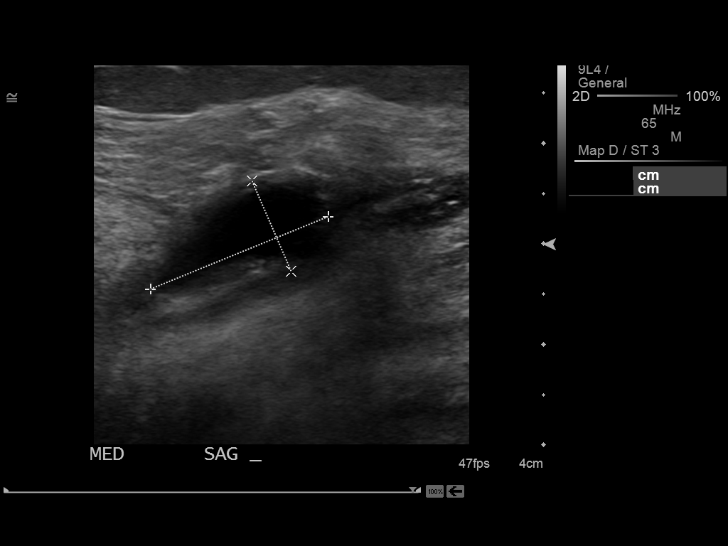
[im 31/38]
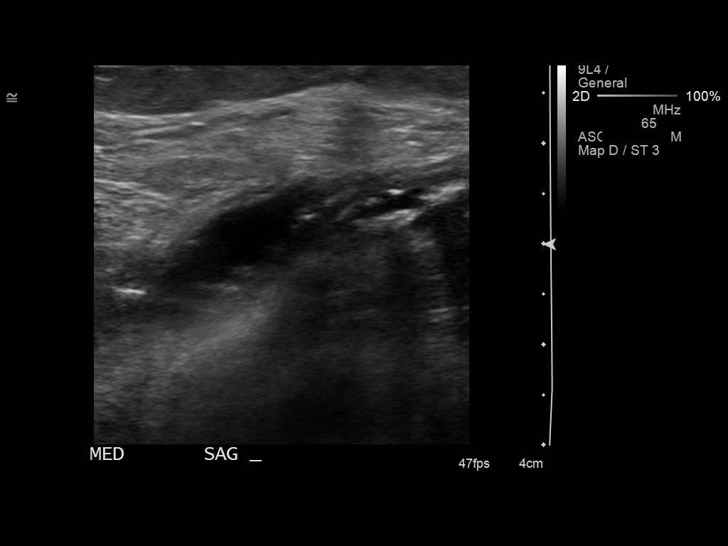
[im 34/38]
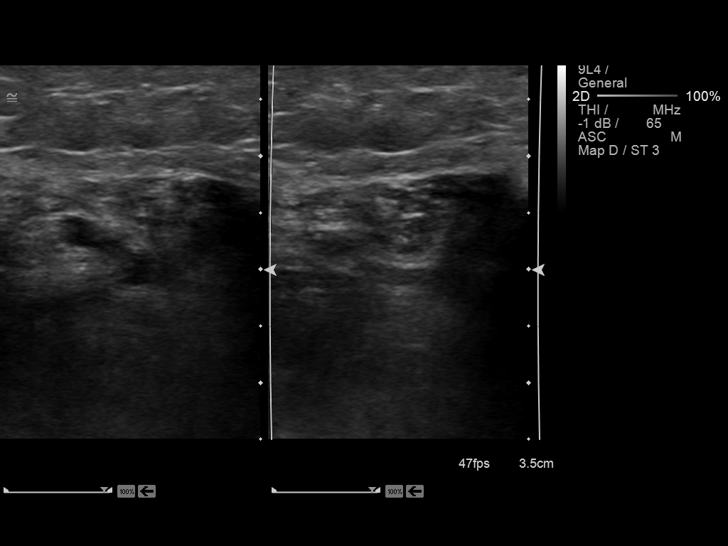
[im 38/38]
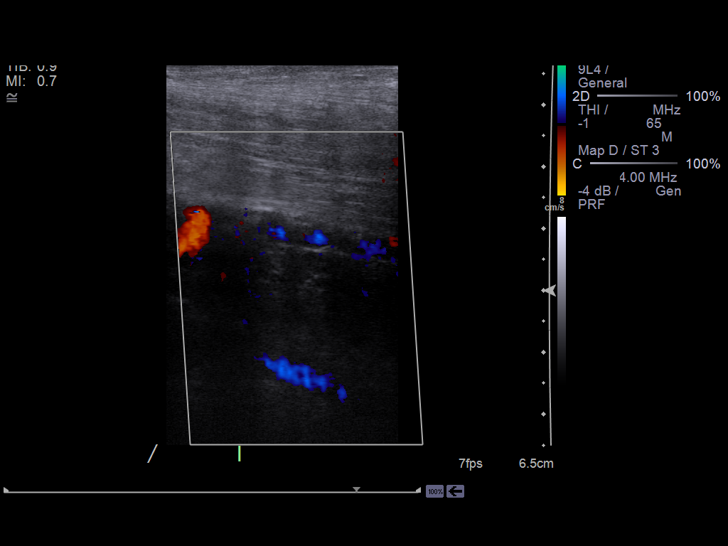

[14 of 24 positions shown; findings below may reference images not displayed]

FINDINGS: Normal compressibility and normal Doppler signal within
the common femoral, superficial femoral and popliteal veins, down
to the proximal calf veins.  No grayscale filling defects to
suggest DVT.

Small fluid collection medially in the popliteal fossa measuring up
to 2 cm, likely small Baker's cyst.
IMPRESSION: No evidence of left lower extremity deep vein thrombosis.

Small Baker's cyst.

## 2012-10-27 DIAGNOSIS — M47817 Spondylosis without myelopathy or radiculopathy, lumbosacral region: Secondary | ICD-10-CM | POA: Diagnosis not present

## 2012-10-27 DIAGNOSIS — M5380 Other specified dorsopathies, site unspecified: Secondary | ICD-10-CM | POA: Diagnosis not present

## 2012-11-29 ENCOUNTER — Ambulatory Visit (HOSPITAL_COMMUNITY)
Admission: RE | Admit: 2012-11-29 | Discharge: 2012-11-29 | Disposition: A | Discharge status: Home / Self Care | Payer: Medicare Other | Source: Ambulatory Visit | Attending: Family Medicine | Admitting: Family Medicine

## 2012-11-29 ENCOUNTER — Ambulatory Visit (INDEPENDENT_AMBULATORY_CARE_PROVIDER_SITE_OTHER): Payer: Medicare Other | Admitting: Family Medicine

## 2012-11-29 ENCOUNTER — Encounter: Payer: Self-pay | Admitting: Family Medicine

## 2012-11-29 VITALS — BP 134/70 | Temp 98.9°F | Ht 71.0 in | Wt 259.0 lb

## 2012-11-29 DIAGNOSIS — R0600 Dyspnea, unspecified: Secondary | ICD-10-CM

## 2012-11-29 DIAGNOSIS — R079 Chest pain, unspecified: Secondary | ICD-10-CM | POA: Diagnosis not present

## 2012-11-29 DIAGNOSIS — R0602 Shortness of breath: Secondary | ICD-10-CM | POA: Insufficient documentation

## 2012-11-29 DIAGNOSIS — R0609 Other forms of dyspnea: Secondary | ICD-10-CM

## 2012-11-29 NOTE — Progress Notes (Signed)
  Subjective:    Patient ID: Logan Hooper, male    DOB: 1945/02/23, 67 y.o.   MRN: [id number]  HPILeft nipple pain for the last 3-4 weeks.   Had lung operation about 5 years ago.   progreStarted  Having lung pain last 6- 8 months.  Describes the pain more is anterior chest pain. Sharp in nature. Comes and goes. Often a facet shooting pain which lasts just a few seconds. No exertional component. No nausea no diaphoresis no shortness of breath.  Patient concerned because a history of empyema pain.  Patient also notes a patch of numbness on his anterior chest, feels it may be leftover from his prior surgery.  No nipple discharge no blood in nipple.     Review of Systems No weight loss no weight gain no abdominal pain no headache no fever no chills ROS otherwise negative    Objective:   Physical Exam  Alert no apparent distress HEENT normal. Lungs clear. Heart rare rate and rhythm. Abdomen benign. Some hypersensitivity in the left anterior chest wall. No breast masses. Patch of numbness distal to the surgical scar. Spine nontender.  EKG occasional PVC otherwise completely normal.      Assessment & Plan:  Impression chest pain multiple concerns. This really does appear to be superficial with costochondritis and in neuropathic pain intermittently. Chest x-ray unremarkable. EKG noncontributory. Discussed at great length. Plan symptomatic care. Aleve or something similar twice per day. Local measures discussed warning signs discussed. WSL

## 2013-01-12 DIAGNOSIS — M25569 Pain in unspecified knee: Secondary | ICD-10-CM | POA: Diagnosis not present

## 2013-01-13 ENCOUNTER — Encounter: Payer: Self-pay | Admitting: Family Medicine

## 2013-01-19 DIAGNOSIS — M7520 Bicipital tendinitis, unspecified shoulder: Secondary | ICD-10-CM | POA: Diagnosis not present

## 2013-01-19 DIAGNOSIS — M66329 Spontaneous rupture of flexor tendons, unspecified upper arm: Secondary | ICD-10-CM | POA: Diagnosis not present

## 2013-01-19 DIAGNOSIS — M75110 Incomplete rotator cuff tear or rupture of unspecified shoulder, not specified as traumatic: Secondary | ICD-10-CM | POA: Diagnosis not present

## 2013-01-30 DIAGNOSIS — G8918 Other acute postprocedural pain: Secondary | ICD-10-CM | POA: Diagnosis not present

## 2013-01-30 DIAGNOSIS — M7520 Bicipital tendinitis, unspecified shoulder: Secondary | ICD-10-CM | POA: Diagnosis not present

## 2013-01-30 DIAGNOSIS — M719 Bursopathy, unspecified: Secondary | ICD-10-CM | POA: Diagnosis not present

## 2013-01-30 DIAGNOSIS — M24119 Other articular cartilage disorders, unspecified shoulder: Secondary | ICD-10-CM | POA: Diagnosis not present

## 2013-01-30 DIAGNOSIS — M75110 Incomplete rotator cuff tear or rupture of unspecified shoulder, not specified as traumatic: Secondary | ICD-10-CM | POA: Diagnosis not present

## 2013-01-30 DIAGNOSIS — M7500 Adhesive capsulitis of unspecified shoulder: Secondary | ICD-10-CM | POA: Diagnosis not present

## 2013-01-30 DIAGNOSIS — M67919 Unspecified disorder of synovium and tendon, unspecified shoulder: Secondary | ICD-10-CM | POA: Diagnosis not present

## 2013-01-30 DIAGNOSIS — M66329 Spontaneous rupture of flexor tendons, unspecified upper arm: Secondary | ICD-10-CM | POA: Diagnosis not present

## 2013-03-14 ENCOUNTER — Ambulatory Visit (INDEPENDENT_AMBULATORY_CARE_PROVIDER_SITE_OTHER): Payer: Medicare Other | Admitting: Family Medicine

## 2013-03-14 ENCOUNTER — Encounter: Payer: Self-pay | Admitting: Family Medicine

## 2013-03-14 VITALS — BP 128/82 | Ht 71.0 in | Wt 257.2 lb

## 2013-03-14 DIAGNOSIS — I10 Essential (primary) hypertension: Secondary | ICD-10-CM

## 2013-03-14 DIAGNOSIS — E119 Type 2 diabetes mellitus without complications: Secondary | ICD-10-CM

## 2013-03-14 DIAGNOSIS — E785 Hyperlipidemia, unspecified: Secondary | ICD-10-CM

## 2013-03-14 LAB — POCT GLYCOSYLATED HEMOGLOBIN (HGB A1C): Hemoglobin A1C: 6.4

## 2013-03-14 NOTE — Progress Notes (Signed)
   Subjective:    Patient ID: Logan Hooper, male    DOB: 1945-03-30, 68 y.o.   MRN: [id number]  Diabetes He presents for his follow-up diabetic visit. He has type 2 diabetes mellitus. Current diabetic treatment includes insulin injections. He is compliant with treatment all of the time.   Patient brought in blood work results from [facility].  Results for orders placed in visit on 09/12/12  POCT GLYCOSYLATED HEMOGLOBIN (HGB A1C)      Result Value Ref Range   Hemoglobin A1C 6.8      Blood pressure has been running good overall compliant with medication.  Of note this patient sees a [place] primary care doctor. He received diabetes control from this Dr. He received his blood pressure control. He did receive annual wellness exam from this Dr. Patient still insistent that he also come here because he wants a local family doctor.  Overall sleep is going fair not ideal. Compliant with medications.  Compliant with numbers  Exercise so so , not so good because of knee pain  Saw local ortho for surgeruy  Results for orders placed in visit on 09/12/12  POCT GLYCOSYLATED HEMOGLOBIN (HGB A1C)      Result Value Ref Range   Hemoglobin A1C 6.8     [facility]  Review of Systems No headache no chest pain no back pain no abdominal pain no change in bowel habits no blood in stool ROS otherwise negative    Objective:   Physical Exam  Alert no apparent distress. Lungs clear. Heart regular in rhythm. H&T normal. Ankles without edema. Pulses intact. Sensation present. C. foot exam      Assessment & Plan:  Impression 1 type 2 diabetes good control. #2 hypertension good control. #3 hyperlipidemia patient brings in his medication good control. Plan diet exercise discussed maintain same meds. Continue to see your primary care physician who adjusts your medications for these conditions. Patient reviewed once again that he needs to have one 10 of the ship, and this is the doctor who  changes his medications and prescribed some. I do not completely agree with flare up approach to diabetes management, specifically Actos use etc. which I have discussed with patient, but I will honor his desire to have a local primary care doctor WSL

## 2013-04-20 DIAGNOSIS — M25519 Pain in unspecified shoulder: Secondary | ICD-10-CM | POA: Diagnosis not present

## 2013-04-27 DIAGNOSIS — Z9181 History of falling: Secondary | ICD-10-CM | POA: Diagnosis not present

## 2013-04-27 DIAGNOSIS — M5380 Other specified dorsopathies, site unspecified: Secondary | ICD-10-CM | POA: Diagnosis not present

## 2013-04-27 DIAGNOSIS — M47817 Spondylosis without myelopathy or radiculopathy, lumbosacral region: Secondary | ICD-10-CM | POA: Diagnosis not present

## 2013-04-27 DIAGNOSIS — Z981 Arthrodesis status: Secondary | ICD-10-CM | POA: Diagnosis not present

## 2013-04-27 DIAGNOSIS — M5137 Other intervertebral disc degeneration, lumbosacral region: Secondary | ICD-10-CM | POA: Diagnosis not present

## 2013-05-18 DIAGNOSIS — M25519 Pain in unspecified shoulder: Secondary | ICD-10-CM | POA: Diagnosis not present

## 2013-05-19 ENCOUNTER — Ambulatory Visit (HOSPITAL_COMMUNITY)
Admission: RE | Admit: 2013-05-19 | Discharge: 2013-05-19 | Disposition: A | Discharge status: Home / Self Care | Payer: Medicare Other | Source: Ambulatory Visit | Attending: Orthopaedic Surgery | Admitting: Orthopaedic Surgery

## 2013-05-19 DIAGNOSIS — M6281 Muscle weakness (generalized): Secondary | ICD-10-CM | POA: Diagnosis not present

## 2013-05-19 DIAGNOSIS — M25611 Stiffness of right shoulder, not elsewhere classified: Secondary | ICD-10-CM | POA: Insufficient documentation

## 2013-05-19 DIAGNOSIS — I10 Essential (primary) hypertension: Secondary | ICD-10-CM | POA: Insufficient documentation

## 2013-05-19 DIAGNOSIS — E119 Type 2 diabetes mellitus without complications: Secondary | ICD-10-CM | POA: Insufficient documentation

## 2013-05-19 DIAGNOSIS — IMO0001 Reserved for inherently not codable concepts without codable children: Secondary | ICD-10-CM | POA: Diagnosis not present

## 2013-05-19 DIAGNOSIS — M25619 Stiffness of unspecified shoulder, not elsewhere classified: Secondary | ICD-10-CM | POA: Diagnosis not present

## 2013-05-19 DIAGNOSIS — M25519 Pain in unspecified shoulder: Secondary | ICD-10-CM

## 2013-05-19 NOTE — Evaluation (Signed)
Occupational Therapy Evaluation  Patient Details  Name: Logan Hooper MRN: [id number] Date of Birth: 1945/03/29  Today's Date: 05/19/2013 Time: 7371-0626 OT Time Calculation (min): 40 min OT Evaluation 850-910 20' Manual Therapy 910-925 15'  Visit#: 1 of 24  Re-eval: 06/18/13  Assessment Diagnosis: S/P  right shoulder scope, manipulation, debridemnt of superior labrum, biceps tendon, biceps tendon release and repari of partial rotator cuff tear. Surgical Date: 01/30/13 Next MD Visit: 06/15/13 Prior Therapy: not for this surgery  Authorization: Medicare  Authorization Time Period: before 10th visit  Authorization Visit#: 1 of 10   Past Medical History:  Past Medical History  Diagnosis Date  . Hypertension   . Hyperlipidemia   . Kidney stones   . Diabetes mellitus without complication     Type II  . Arthritis   . ED (erectile dysfunction)   . Sciatica   . PTSD (post-traumatic stress disorder)    Past Surgical History:  Past Surgical History  Procedure Laterality Date  . Back surgery      Subjective  S:  [facility] in [facility] because [facility] was so bad.  My pain isnt much better now. Pertinent History: Logan Hooper states he began experiencing pain and decreased mobility in his right shoulder in late 2014.  He consulted with Logan Hooper, and had surgery on 01/30/13 as followes:  right shoulder scope, manipulation, debridemnt of superior labrum, biceps tendon, biceps tendon release and reparie of partial rotator cuff tear.  He has been completing some exercises at home and feels hes just not progressing as quickly as he would like.  He has the same amount of pain as he did prior to therapy.  He has been referred to occupational therapy for evaluation and treatment.  PMH is also significant for:   Limitations: Protocol:  progress as tolerated. Special Tests: FOTO scored 51% independent Patient Stated Goals: I want to get rid of the pain.  Pain  Assessment Currently in Pain?: Yes Pain Score: 5  Pain Location: Shoulder Pain Orientation: Right Pain Type: Acute pain  Precautions/Restrictions  Precautions Precautions: None Restrictions Weight Bearing Restrictions: No  Balance Screening Balance Screen Has the patient fallen in the past 6 months: Yes How many times?: 1 Has the patient had a decrease in activity level because of a fear of falling? : No Is the patient reluctant to leave their home because of a fear of falling? : No  Prior Functioning  Home Living Additional Comments: lives with his wife  Prior Function Level of Independence: Independent with basic ADLs;Independent with homemaking with ambulation  Able to Take Stairs?: Yes Driving: Yes Vocation: Retired Comments: enjoys spending time with his grandchildren, going to [employer], and playing corn hole  Assessment ADL/Vision/Perception ADL ADL Comments: Patient is not able to use his right hand/arm to reach overhead, out to the side or behind his body.  He is not able to lift anything heavy, and reports pain with all functional activities.  Dominant Hand: Right Vision - History Baseline Vision: Wears glasses only for reading  Cognition/Observation Cognition Orientation Level: Oriented X4  Sensation/Coordination/Edema Sensation Light Touch: Appears Intact Coordination Gross Motor Movements are Fluid and Coordinated: Yes Fine Motor Movements are Fluid and Coordinated: Yes  Additional Assessments RUE AROM (degrees) RUE Overall AROM Comments: assessed in supine ER/IR with shoulder adducted Right Shoulder Flexion: 142 Degrees Right Shoulder ABduction: 90 Degrees Right Shoulder Internal Rotation: 70 Degrees Right Shoulder External Rotation: 20 Degrees  Right Elbow Flexion: 137 Right Elbow Extension:  (lacks 20 degrees from neutral) RUE PROM (degrees) RUE Overall PROM Comments: assessed in supine ER/IR with shoulder adducted Right Shoulder Flexion: 148  Degrees RUE Strength RUE Overall Strength Comments: strength not assessed this date Palpation Palpation: moderate fascial restrictions noted in right upper arm, shoulder, and scapular region     Exercise/Treatments     Manual Therapy Manual Therapy: Myofascial release Myofascial Release: Myofascial release (MFR) and manual stretching to right upper arm, scapular, and shoulder region to decrease pain and fascial restrictions and increase pain free mobility.  Occupational Therapy Assessment and Plan OT Assessment and Plan Clinical Impression Statement: A:  Patient referred to OT s/p right shoulder surgery with the following deficits: Pt will benefit from skilled therapeutic intervention in order to improve on the following deficits: Decreased range of motion;Decreased strength;Pain;Increased fascial restricitons Rehab Potential: Good Clinical Impairments Affecting Rehab Potential: age, motiviation, prior therapy for left rcr OT Frequency: Min 3X/week OT Duration: 8 weeks OT Treatment/Interventions: Self-care/ADL training;Therapeutic exercise;Manual therapy;Modalities;Patient/family education;Therapeutic activities OT Plan: P:  Skilled OT intervention to decrease pain and restrictions and improve pain free mobility and strength in right shoulder in order to return to prior level of independence.  Treatment Plan:  MFR and manual stretching, PROM progressing to AAROM, ball stretches, bridges, AROM elev, ext , row progress as tolerated.    Goals Short Term Goals Time to Complete Short Term Goals: 4 weeks Short Term Goal 1: Patient will be educated on a HEP. Short Term Goal 2: Patient will improve right shoulder and elbow PROM to [facility] for increased ability to reach overhead. Short Term Goal 3: Patient will improve right shoulder strength to 3+/5 for increased ability to lift up his grandson. Short Term Goal 4: Pateint will decrease pain to 3/10 in his right shoulder during functional  activities. Short Term Goal 5: Patient will decrease fascial restrictions to min-mod in his right shoulder. Long Term Goals Time to Complete Long Term Goals: 8 weeks Long Term Goal 1: Patient will return to prior level of independence with all B/IADLs and leisure activities.  Long Term Goal 2: Patient will increase AROM in right shoulder to [facility] in order to reach overhead when completing yardwork and be able to throw beanbags playing cornhole. Long Term Goal 3: Patient will increase right shoulder strength to 5/5 for increased ability to lift tools when completing yardwork. Long Term Goal 4: Patient will decrease right shoulder pain to 2/10 or better while completing daily activities.  Long Term Goal 5: Patient will decrease right shoulder fascial restrictions to minimal.  Problem List Patient Active Problem List   Diagnosis Date Noted  . Pain in joint, shoulder region 05/19/2013  . Decreased range of motion of right shoulder 05/19/2013  . Muscle weakness (generalized) 05/19/2013  . Type II or unspecified type diabetes mellitus without mention of complication, not stated as uncontrolled 05/20/2012  . Other and unspecified hyperlipidemia 05/20/2012  . Insomnia 05/20/2012  . Essential hypertension, benign 05/20/2012    End of Session Activity Tolerance: Patient tolerated treatment well General Behavior During Therapy: [facility] for tasks assessed/performed OT Plan of Care OT Home Exercise Plan: towel slides seated at table OT Patient Instructions: scanned Consulted and Agree with Plan of Care: Patient  GO Functional Assessment Tool Used: FOTO 51% independent Functional Limitation: Carrying, moving and handling objects Carrying, Moving and Handling Objects Current Status ([id number]): At least 40 percent but less than 60 percent impaired, limited or restricted Carrying, Moving  and Handling Objects Goal Status [phone]): At least 1 percent but less than 20 percent impaired, limited or  restricted  Vangie Bicker, OTR/L [phone]  05/19/2013, 2:29 PM  Physician Documentation Your signature is required to indicate approval of the treatment plan as stated above.  Please sign and either send electronically or make a copy of this report for your files and return this physician signed original.  Please mark one 1.__approve of plan  2. ___approve of plan with the following conditions.   ______________________________                                                          _____________________ Physician Signature                                                                                                             Date

## 2013-05-22 ENCOUNTER — Ambulatory Visit (HOSPITAL_COMMUNITY)
Admission: RE | Admit: 2013-05-22 | Discharge: 2013-05-22 | Disposition: A | Discharge status: Home / Self Care | Payer: Medicare Other | Source: Ambulatory Visit | Attending: Orthopaedic Surgery | Admitting: Orthopaedic Surgery

## 2013-05-22 DIAGNOSIS — E119 Type 2 diabetes mellitus without complications: Secondary | ICD-10-CM | POA: Diagnosis not present

## 2013-05-22 DIAGNOSIS — M6281 Muscle weakness (generalized): Secondary | ICD-10-CM | POA: Diagnosis not present

## 2013-05-22 DIAGNOSIS — I10 Essential (primary) hypertension: Secondary | ICD-10-CM | POA: Diagnosis not present

## 2013-05-22 DIAGNOSIS — M25611 Stiffness of right shoulder, not elsewhere classified: Secondary | ICD-10-CM

## 2013-05-22 DIAGNOSIS — M25619 Stiffness of unspecified shoulder, not elsewhere classified: Secondary | ICD-10-CM | POA: Diagnosis not present

## 2013-05-22 DIAGNOSIS — M25519 Pain in unspecified shoulder: Secondary | ICD-10-CM | POA: Diagnosis not present

## 2013-05-22 DIAGNOSIS — IMO0001 Reserved for inherently not codable concepts without codable children: Secondary | ICD-10-CM | POA: Diagnosis not present

## 2013-05-22 NOTE — Progress Notes (Signed)
Occupational Therapy Treatment Patient Details  Name: Logan Hooper MRN: [id number] Date of Birth: 06-23-1945  Today's Date: 05/22/2013 Time: 1520-1600 OT Time Calculation (min): 40 min MFR 1520-1530 10' Therex 1530-1600 30'  Visit#: 2 of 24  Re-eval: 06/18/13    Authorization: Medicare  Authorization Time Period: before 10th visit  Authorization Visit#: 2 of 10  Subjective Symptoms/Limitations Symptoms: S: I was so busy this weekend I didn't have time to do any of the exercises.  Pain Assessment Currently in Pain?: Yes Pain Score: 5  Pain Location: Shoulder Pain Orientation: Right Pain Type: Acute pain  Precautions/Restrictions  Precautions Precautions: None  Exercise/Treatments Supine Protraction: PROM;10 reps Horizontal ABduction: PROM;10 reps External Rotation: PROM;10 reps Internal Rotation: PROM;10 reps Flexion: PROM;10 reps ABduction: PROM;10 reps Other Supine Exercises: bridges 10X Seated Elevation: AROM;15 reps Extension: AROM;15 reps Row: AROM;15 reps Therapy Ball Flexion: 15 reps ABduction: 15 reps ROM / Strengthening / Isometric Strengthening Thumb Tacks: 1' Prot/Ret//Elev/Dep: 1'      Manual Therapy Manual Therapy: Myofascial release Myofascial Release: Myofascial release (MFR) and manual stretching to right upper arm, scapular, and shoulder region to decrease pain and fascial restrictions and increase pain free mobility.  Occupational Therapy Assessment and Plan OT Assessment and Plan Clinical Impression Statement: A: Patient will increased tightness with passive stretching this date. Majority of limited mobility with abduction and ER. Tolerated all exercises well.  OT Plan: P: Isometrics. Progress to AAROM supine   Goals Short Term Goals Time to Complete Short Term Goals: 4 weeks Short Term Goal 1: Patient will be educated on a HEP. Short Term Goal 1 Progress: Progressing toward goal Short Term Goal 2: Patient will improve right  shoulder and elbow PROM to [facility] for increased ability to reach overhead. Short Term Goal 2 Progress: Progressing toward goal Short Term Goal 3: Patient will improve right shoulder strength to 3+/5 for increased ability to lift up his grandson. Short Term Goal 3 Progress: Progressing toward goal Short Term Goal 4: Pateint will decrease pain to 3/10 in his right shoulder during functional activities. Short Term Goal 4 Progress: Progressing toward goal Short Term Goal 5: Patient will decrease fascial restrictions to min-mod in his right shoulder. Short Term Goal 5 Progress: Progressing toward goal Long Term Goals Time to Complete Long Term Goals: 8 weeks Long Term Goal 1: Patient will return to prior level of independence with all B/IADLs and leisure activities.  Long Term Goal 1 Progress: Progressing toward goal Long Term Goal 2: Patient will increase AROM in right shoulder to [facility] in order to reach overhead when completing yardwork and be able to throw beanbags playing cornhole. Long Term Goal 2 Progress: Progressing toward goal Long Term Goal 3: Patient will increase right shoulder strength to 5/5 for increased ability to lift tools when completing yardwork. Long Term Goal 3 Progress: Progressing toward goal Long Term Goal 4: Patient will decrease right shoulder pain to 2/10 or better while completing daily activities.  Long Term Goal 4 Progress: Progressing toward goal Long Term Goal 5: Patient will decrease right shoulder fascial restrictions to minimal. Long Term Goal 5 Progress: Progressing toward goal  Problem List Patient Active Problem List   Diagnosis Date Noted  . Pain in joint, shoulder region 05/19/2013  . Decreased range of motion of right shoulder 05/19/2013  . Muscle weakness (generalized) 05/19/2013  . Type II or unspecified type diabetes mellitus without mention of complication, not stated as uncontrolled 05/20/2012  . Other and unspecified hyperlipidemia 05/20/2012  .  Insomnia 05/20/2012  . Essential hypertension, benign 05/20/2012    End of Session Activity Tolerance: Patient tolerated treatment well General Behavior During Therapy: [facility] for tasks assessed/performed   Ailene Ravel, OTR/L,CBIS   05/22/2013, 4:05 PM

## 2013-05-24 ENCOUNTER — Ambulatory Visit (HOSPITAL_COMMUNITY)
Admission: RE | Admit: 2013-05-24 | Discharge: 2013-05-24 | Disposition: A | Discharge status: Home / Self Care | Payer: Medicare Other | Source: Ambulatory Visit | Attending: Orthopaedic Surgery | Admitting: Orthopaedic Surgery

## 2013-05-24 DIAGNOSIS — M25519 Pain in unspecified shoulder: Secondary | ICD-10-CM

## 2013-05-24 DIAGNOSIS — E119 Type 2 diabetes mellitus without complications: Secondary | ICD-10-CM | POA: Diagnosis not present

## 2013-05-24 DIAGNOSIS — M25611 Stiffness of right shoulder, not elsewhere classified: Secondary | ICD-10-CM

## 2013-05-24 DIAGNOSIS — M6281 Muscle weakness (generalized): Secondary | ICD-10-CM | POA: Diagnosis not present

## 2013-05-24 DIAGNOSIS — I10 Essential (primary) hypertension: Secondary | ICD-10-CM | POA: Diagnosis not present

## 2013-05-24 DIAGNOSIS — M25619 Stiffness of unspecified shoulder, not elsewhere classified: Secondary | ICD-10-CM | POA: Diagnosis not present

## 2013-05-24 DIAGNOSIS — IMO0001 Reserved for inherently not codable concepts without codable children: Secondary | ICD-10-CM | POA: Diagnosis not present

## 2013-05-24 NOTE — Progress Notes (Signed)
Occupational Therapy Treatment Patient Details  Name: Logan Hooper MRN: [id number] Date of Birth: 11/06/1945  Today's Date: 05/24/2013 Time: 5465-0354 OT Time Calculation (min): 39 min Manual therapy 6568-1275 15' Therapeutic exercises 1700-1749 24'  Visit#: 3 of 24  Re-eval: 06/18/13    Authorization: Medicare  Authorization Time Period: before 10th visit  Authorization Visit#: 3 of 10  Subjective S:  I still have some soreness and overall it is doing better. Limitations: Protocol:  progress as tolerated. Pain Assessment Currently in Pain?: Yes Pain Score: 4  Pain Location: Shoulder Pain Orientation: Right Pain Type: Acute pain  Precautions/Restrictions   progress as tolerated  Exercise/Treatments Supine Protraction: PROM;10 reps Horizontal ABduction: PROM;10 reps External Rotation: PROM;10 reps Internal Rotation: PROM;10 reps Flexion: PROM;10 reps ABduction: PROM;10 reps Other Supine Exercises: bridges 20X Seated Elevation: AROM;15 reps Extension: AROM;15 reps Row: AROM;15 reps Pulleys Flexion: 1 minute ABduction: 1 minute Therapy Ball Flexion: 20 reps ABduction: 20 reps ROM / Strengthening / Isometric Strengthening Thumb Tacks: 1' Prot/Ret//Elev/Dep: 1' Flexion: Supine;3X3" Extension: Supine;3X3" External Rotation: Supine;3X3" Internal Rotation: Supine;3X3" ABduction: Supine;3X3" ADduction: Supine;3X3"     Manual Therapy Manual Therapy: Myofascial release Myofascial Release: Myofascial release (MFR) and manual stretching to right upper arm, scapular, and shoulder region to decrease pain and fascial restrictions and increase pain free mobility.  Occupational Therapy Assessment and Plan OT Assessment and Plan Clinical Impression Statement: A:  Flexion with greater PROM this date, abduction more difficult as is internal and external rotation OT Plan: P:  Attempt AAROM in supine.  Increase to 2' on pulleys for increased PROM/AAROM.   Goals Short  Term Goals Time to Complete Short Term Goals: 4 weeks Short Term Goal 1: Patient will be educated on a HEP. Short Term Goal 2: Patient will improve right shoulder and elbow PROM to [facility] for increased ability to reach overhead. Short Term Goal 3: Patient will improve right shoulder strength to 3+/5 for increased ability to lift up his grandson. Short Term Goal 4: Pateint will decrease pain to 3/10 in his right shoulder during functional activities. Short Term Goal 5: Patient will decrease fascial restrictions to min-mod in his right shoulder. Long Term Goals Time to Complete Long Term Goals: 8 weeks Long Term Goal 1: Patient will return to prior level of independence with all B/IADLs and leisure activities.  Long Term Goal 2: Patient will increase AROM in right shoulder to [facility] in order to reach overhead when completing yardwork and be able to throw beanbags playing cornhole. Long Term Goal 3: Patient will increase right shoulder strength to 5/5 for increased ability to lift tools when completing yardwork. Long Term Goal 4: Patient will decrease right shoulder pain to 2/10 or better while completing daily activities.  Long Term Goal 5: Patient will decrease right shoulder fascial restrictions to minimal.  Problem List Patient Active Problem List   Diagnosis Date Noted  . Pain in joint, shoulder region 05/19/2013  . Decreased range of motion of right shoulder 05/19/2013  . Muscle weakness (generalized) 05/19/2013  . Type II or unspecified type diabetes mellitus without mention of complication, not stated as uncontrolled 05/20/2012  . Other and unspecified hyperlipidemia 05/20/2012  . Insomnia 05/20/2012  . Essential hypertension, benign 05/20/2012    End of Session Activity Tolerance: Patient tolerated treatment well General Behavior During Therapy: [facility] for tasks assessed/performed  Louise, OTR/L [phone]  05/24/2013, 11:08 AM

## 2013-05-25 DIAGNOSIS — M545 Low back pain, unspecified: Secondary | ICD-10-CM | POA: Diagnosis not present

## 2013-05-25 DIAGNOSIS — IMO0002 Reserved for concepts with insufficient information to code with codable children: Secondary | ICD-10-CM | POA: Diagnosis not present

## 2013-05-25 DIAGNOSIS — M47817 Spondylosis without myelopathy or radiculopathy, lumbosacral region: Secondary | ICD-10-CM | POA: Diagnosis not present

## 2013-05-25 DIAGNOSIS — Z981 Arthrodesis status: Secondary | ICD-10-CM | POA: Diagnosis not present

## 2013-05-26 ENCOUNTER — Ambulatory Visit (HOSPITAL_COMMUNITY)
Admission: RE | Admit: 2013-05-26 | Discharge: 2013-05-26 | Disposition: A | Discharge status: Home / Self Care | Payer: Medicare Other | Source: Ambulatory Visit | Attending: Family Medicine | Admitting: Family Medicine

## 2013-05-26 DIAGNOSIS — E119 Type 2 diabetes mellitus without complications: Secondary | ICD-10-CM | POA: Diagnosis not present

## 2013-05-26 DIAGNOSIS — I10 Essential (primary) hypertension: Secondary | ICD-10-CM | POA: Diagnosis not present

## 2013-05-26 DIAGNOSIS — M6281 Muscle weakness (generalized): Secondary | ICD-10-CM | POA: Diagnosis not present

## 2013-05-26 DIAGNOSIS — M25519 Pain in unspecified shoulder: Secondary | ICD-10-CM | POA: Diagnosis not present

## 2013-05-26 DIAGNOSIS — M25619 Stiffness of unspecified shoulder, not elsewhere classified: Secondary | ICD-10-CM | POA: Diagnosis not present

## 2013-05-26 DIAGNOSIS — IMO0001 Reserved for inherently not codable concepts without codable children: Secondary | ICD-10-CM | POA: Diagnosis not present

## 2013-05-26 NOTE — Progress Notes (Signed)
Occupational Therapy Treatment Patient Details  Name: Logan Hooper MRN: [id number] Date of Birth: 11/13/1945  Today's Date: 05/26/2013 Time: 0865-7846 OT Time Calculation (min): 45 min Manual 9629-5284 (15') Therapeutic Exercises 1122-1152 (30')  Visit#: 4 of 24  Re-eval: 06/18/13    Authorization: Medicare  Authorization Time Period: before 10th visit  Authorization Visit#: 4 of 10  Subjective Symptoms/Limitations Symptoms: "its been hurting more since about last night. it would get a little less, then start back up again." Limitations: Protocol:  progress as tolerated. Pain Assessment Currently in Pain?: Yes Pain Score: 4  Pain Location: Shoulder Pain Orientation: Right Pain Type: Acute pain   Exercise/Treatments Supine Protraction: PROM;AAROM;10 reps Horizontal ABduction: PROM;AAROM;10 reps External Rotation: PROM;AAROM;10 reps Internal Rotation: PROM;AAROM;10 reps Flexion: PROM;AAROM;10 reps ABduction: PROM;AAROM;10 reps Other Supine Exercises: bridges 20X Seated Elevation: AROM;15 reps Extension: AROM;15 reps Row: AROM;15 reps Pulleys Flexion: 2 minutes ABduction: 2 minutes Therapy Ball Flexion: 20 reps ABduction: 20 reps  Manual Therapy Manual Therapy: Myofascial release Myofascial Release: Myofascial release (MFR) and manual stretching to right upper arm, scapular, and shoulder region to decrease pain and fascial restrictions and increase pain free mobility.    Occupational Therapy Assessment and Plan OT Assessment and Plan Clinical Impression Statement: Added supine AAROM this session - pt had good toleance with 10 reps each. Abdction to about 90 in PROM this date. pt had good tolerance an dimproved ROM with pulleys - Required increased cueing for form in abduction.   OT Plan: Continue PROM, ball stretches, and pulleys to increase ROM.  Pro/ret/elev/dep and thumbtacks.   Goals Short Term Goals Short Term Goal 1: Patient will be educated on a  HEP. Short Term Goal 1 Progress: Progressing toward goal Short Term Goal 2: Patient will improve right shoulder and elbow PROM to [facility] for increased ability to reach overhead. Short Term Goal 2 Progress: Progressing toward goal Short Term Goal 3: Patient will improve right shoulder strength to 3+/5 for increased ability to lift up his grandson. Short Term Goal 3 Progress: Progressing toward goal Short Term Goal 4: Pateint will decrease pain to 3/10 in his right shoulder during functional activities. Short Term Goal 4 Progress: Progressing toward goal Short Term Goal 5: Patient will decrease fascial restrictions to min-mod in his right shoulder. Short Term Goal 5 Progress: Progressing toward goal Long Term Goals Long Term Goal 1: Patient will return to prior level of independence with all B/IADLs and leisure activities.  Long Term Goal 1 Progress: Progressing toward goal Long Term Goal 2: Patient will increase AROM in right shoulder to [facility] in order to reach overhead when completing yardwork and be able to throw beanbags playing cornhole. Long Term Goal 2 Progress: Progressing toward goal Long Term Goal 3: Patient will increase right shoulder strength to 5/5 for increased ability to lift tools when completing yardwork. Long Term Goal 3 Progress: Progressing toward goal Long Term Goal 4: Patient will decrease right shoulder pain to 2/10 or better while completing daily activities.  Long Term Goal 4 Progress: Progressing toward goal Long Term Goal 5: Patient will decrease right shoulder fascial restrictions to minimal. Long Term Goal 5 Progress: Progressing toward goal  Problem List Patient Active Problem List   Diagnosis Date Noted  . Pain in joint, shoulder region 05/19/2013  . Decreased range of motion of right shoulder 05/19/2013  . Muscle weakness (generalized) 05/19/2013  . Type II or unspecified type diabetes mellitus without mention of complication, not stated as uncontrolled  05/20/2012  .  Other and unspecified hyperlipidemia 05/20/2012  . Insomnia 05/20/2012  . Essential hypertension, benign 05/20/2012    End of Session Activity Tolerance: Patient tolerated treatment well General Behavior During Therapy: [facility] for tasks assessed/performed  GO    Bea Graff, [place], OTR/L [phone]  05/26/2013, 11:52 AM

## 2013-05-30 ENCOUNTER — Ambulatory Visit (HOSPITAL_COMMUNITY)
Admission: RE | Admit: 2013-05-30 | Discharge: 2013-05-30 | Disposition: A | Discharge status: Home / Self Care | Payer: Medicare Other | Source: Ambulatory Visit | Attending: Orthopaedic Surgery | Admitting: Orthopaedic Surgery

## 2013-05-30 DIAGNOSIS — M25619 Stiffness of unspecified shoulder, not elsewhere classified: Secondary | ICD-10-CM | POA: Diagnosis not present

## 2013-05-30 DIAGNOSIS — E119 Type 2 diabetes mellitus without complications: Secondary | ICD-10-CM | POA: Diagnosis not present

## 2013-05-30 DIAGNOSIS — IMO0001 Reserved for inherently not codable concepts without codable children: Secondary | ICD-10-CM | POA: Diagnosis not present

## 2013-05-30 DIAGNOSIS — M6281 Muscle weakness (generalized): Secondary | ICD-10-CM | POA: Diagnosis not present

## 2013-05-30 DIAGNOSIS — M25611 Stiffness of right shoulder, not elsewhere classified: Secondary | ICD-10-CM

## 2013-05-30 DIAGNOSIS — M25519 Pain in unspecified shoulder: Secondary | ICD-10-CM | POA: Diagnosis not present

## 2013-05-30 DIAGNOSIS — I10 Essential (primary) hypertension: Secondary | ICD-10-CM | POA: Diagnosis not present

## 2013-05-30 NOTE — Progress Notes (Signed)
Occupational Therapy Treatment Patient Details  Name: Logan Hooper MRN: [id number] Date of Birth: 1945/10/05  Today's Date: 05/30/2013 Time: 2637-8588 OT Time Calculation (min): 45 min Manual therapy [phone] 22' Therapeutic exercises [phone] 23'  Visit#: 5 of 24  Re-eval: 06/18/13    Authorization: Medicare  Authorization Time Period: before 10th visit  Authorization Visit#: 5 of 10  Subjective  S:  Its feeling a little a better, I have been stretching it.  Limitations: Protocol:  progress as tolerated. Pain Assessment Currently in Pain?: Yes Pain Score: 2  Pain Location: Shoulder Pain Orientation: Right Pain Type: Acute pain  Precautions/Restrictions   progress as tolerated  Exercise/Treatments Supine Protraction: PROM;10 reps;AAROM;12 reps Horizontal ABduction: PROM;10 reps;AAROM;12 reps External Rotation: PROM;10 reps;AAROM;12 reps Internal Rotation: PROM;10 reps;AAROM;12 reps Flexion: PROM;10 reps;AAROM;12 reps ABduction: PROM;10 reps;AAROM;12 reps Standing Protraction: AAROM;10 reps Horizontal ABduction: AAROM;10 reps External Rotation: AAROM;10 reps Internal Rotation: AAROM;10 reps Flexion: AAROM;10 reps ABduction: AAROM;10 reps Pulleys Flexion: 2 minutes ABduction: 2 minutes Therapy Ball Flexion: 25 reps ABduction: 25 reps ROM / Strengthening / Isometric Strengthening Thumb Tacks: 1' Prot/Ret//Elev/Dep: 1'            Occupational Therapy Assessment and Plan OT Assessment and Plan Clinical Impression Statement: A:  Added seated AAROM this date.  Requires tactile and verbal cuing to depress shoulder blade when abducting with ball and with pulleys. OT Plan: P:  Add scapular theraband exercises.   Goals Short Term Goals Short Term Goal 1: Patient will be educated on a HEP. Short Term Goal 1 Progress: Progressing toward goal Short Term Goal 2: Patient will improve right shoulder and elbow PROM to [facility] for increased ability to reach  overhead. Short Term Goal 2 Progress: Progressing toward goal Short Term Goal 3: Patient will improve right shoulder strength to 3+/5 for increased ability to lift up his grandson. Short Term Goal 3 Progress: Progressing toward goal Short Term Goal 4: Pateint will decrease pain to 3/10 in his right shoulder during functional activities. Short Term Goal 4 Progress: Progressing toward goal Short Term Goal 5: Patient will decrease fascial restrictions to min-mod in his right shoulder. Short Term Goal 5 Progress: Progressing toward goal Long Term Goals Long Term Goal 1: Patient will return to prior level of independence with all B/IADLs and leisure activities.  Long Term Goal 1 Progress: Progressing toward goal Long Term Goal 2: Patient will increase AROM in right shoulder to [facility] in order to reach overhead when completing yardwork and be able to throw beanbags playing cornhole. Long Term Goal 2 Progress: Progressing toward goal Long Term Goal 3: Patient will increase right shoulder strength to 5/5 for increased ability to lift tools when completing yardwork. Long Term Goal 3 Progress: Progressing toward goal Long Term Goal 4: Patient will decrease right shoulder pain to 2/10 or better while completing daily activities.  Long Term Goal 4 Progress: Progressing toward goal Long Term Goal 5: Patient will decrease right shoulder fascial restrictions to minimal. Long Term Goal 5 Progress: Progressing toward goal  Problem List Patient Active Problem List   Diagnosis Date Noted  . Pain in joint, shoulder region 05/19/2013  . Decreased range of motion of right shoulder 05/19/2013  . Muscle weakness (generalized) 05/19/2013  . Type II or unspecified type diabetes mellitus without mention of complication, not stated as uncontrolled 05/20/2012  . Other and unspecified hyperlipidemia 05/20/2012  . Insomnia 05/20/2012  . Essential hypertension, benign 05/20/2012    End of Session Activity Tolerance:  Patient  tolerated treatment well General Behavior During Therapy: [facility] for tasks assessed/performed  [place], OTR/L [phone]  05/30/2013, 10:16 AM

## 2013-05-31 ENCOUNTER — Ambulatory Visit (HOSPITAL_COMMUNITY)
Admission: RE | Admit: 2013-05-31 | Discharge: 2013-05-31 | Disposition: A | Discharge status: Home / Self Care | Payer: Medicare Other | Source: Ambulatory Visit | Attending: Family Medicine | Admitting: Family Medicine

## 2013-05-31 DIAGNOSIS — M6281 Muscle weakness (generalized): Secondary | ICD-10-CM | POA: Diagnosis not present

## 2013-05-31 DIAGNOSIS — M25519 Pain in unspecified shoulder: Secondary | ICD-10-CM | POA: Diagnosis not present

## 2013-05-31 DIAGNOSIS — IMO0001 Reserved for inherently not codable concepts without codable children: Secondary | ICD-10-CM | POA: Diagnosis not present

## 2013-05-31 DIAGNOSIS — E119 Type 2 diabetes mellitus without complications: Secondary | ICD-10-CM | POA: Diagnosis not present

## 2013-05-31 DIAGNOSIS — M25619 Stiffness of unspecified shoulder, not elsewhere classified: Secondary | ICD-10-CM | POA: Diagnosis not present

## 2013-05-31 DIAGNOSIS — I10 Essential (primary) hypertension: Secondary | ICD-10-CM | POA: Diagnosis not present

## 2013-05-31 NOTE — Progress Notes (Signed)
Occupational Therapy Treatment Patient Details  Name: Logan Hooper MRN: [id number] Date of Birth: [birth year]  Today's Date: 05/31/2013 Time: 9735-3299 OT Time Calculation (min): 42 min Manual [phone] (15') Therapeutic Exercises [phone] (53')  Visit#: 6 of 24  Re-eval: 06/18/13    Authorization: Medicare  Authorization Time Period: before 10th visit  Authorization Visit#: 6 of 10  Subjective Symptoms/Limitations Symptoms: Iits hurts just a little bit." Pain Assessment Currently in Pain?: Yes Pain Score: 1  Pain Location: Shoulder Pain Orientation: Right Pain Type: Acute pain  Precautions/Restrictions     Exercise/Treatments Supine Protraction: PROM;10 reps;AAROM;12 reps Horizontal ABduction: PROM;10 reps;AAROM;12 reps External Rotation: PROM;10 reps;AAROM;12 reps Internal Rotation: PROM;10 reps;AAROM;12 reps Flexion: PROM;10 reps;AAROM;12 reps ABduction: PROM;10 reps;AAROM;12 reps Seated Elevation: AROM;15 reps Extension: AROM;15 reps Row: AROM;15 reps Standing Protraction: AAROM;10 reps Horizontal ABduction: AAROM;10 reps External Rotation: AAROM;10 reps;Theraband Theraband Level (Shoulder External Rotation): Level 2 (Red) Internal Rotation: AAROM;10 reps;Theraband Theraband Level (Shoulder Internal Rotation): Level 2 (Red) Flexion: AAROM;10 reps ABduction: AAROM;10 reps Extension: Theraband;12 reps Theraband Level (Shoulder Extension): Level 2 (Red) Row: Theraband;12 reps Theraband Level (Shoulder Row): Level 2 (Red) Therapy Ball Flexion: 25 reps ABduction: 25 reps ROM / Strengthening / Isometric Strengthening Thumb Tacks: 1' Prot/Ret//Elev/Dep: 1'   Manual Therapy Manual Therapy: Myofascial release Myofascial Release: Myofascial release (MFR) and manual stretching to right upper arm, scapular, and shoulder region to decrease pain and fascial restrictions and increase pain free mobility.     Occupational Therapy Assessment and Plan OT  Assessment and Plan Clinical Impression Statement: Good tolerance of AAROM this session, both seated and supine.  Added scapular theraband (red) this session). Pt had good tolerance and only sligh discomfort with ER. OT Plan: Increase reps t-band and AAROM.   Goals Short Term Goals Short Term Goal 1: Patient will be educated on a HEP. Short Term Goal 1 Progress: Progressing toward goal Short Term Goal 2: Patient will improve right shoulder and elbow PROM to [facility] for increased ability to reach overhead. Short Term Goal 2 Progress: Progressing toward goal Short Term Goal 3: Patient will improve right shoulder strength to 3+/5 for increased ability to lift up his grandson. Short Term Goal 3 Progress: Progressing toward goal Short Term Goal 4: Pateint will decrease pain to 3/10 in his right shoulder during functional activities. Short Term Goal 4 Progress: Progressing toward goal Short Term Goal 5: Patient will decrease fascial restrictions to min-mod in his right shoulder. Short Term Goal 5 Progress: Progressing toward goal Long Term Goals Long Term Goal 1: Patient will return to prior level of independence with all B/IADLs and leisure activities.  Long Term Goal 1 Progress: Progressing toward goal Long Term Goal 2: Patient will increase AROM in right shoulder to [facility] in order to reach overhead when completing yardwork and be able to throw beanbags playing cornhole. Long Term Goal 2 Progress: Progressing toward goal Long Term Goal 3: Patient will increase right shoulder strength to 5/5 for increased ability to lift tools when completing yardwork. Long Term Goal 3 Progress: Progressing toward goal Long Term Goal 4: Patient will decrease right shoulder pain to 2/10 or better while completing daily activities.  Long Term Goal 4 Progress: Progressing toward goal Long Term Goal 5: Patient will decrease right shoulder fascial restrictions to minimal. Long Term Goal 5 Progress: Progressing toward  goal  Problem List Patient Active Problem List   Diagnosis Date Noted  . Pain in joint, shoulder region 05/19/2013  . Decreased range of motion of  right shoulder 05/19/2013  . Muscle weakness (generalized) 05/19/2013  . Type II or unspecified type diabetes mellitus without mention of complication, not stated as uncontrolled 05/20/2012  . Other and unspecified hyperlipidemia 05/20/2012  . Insomnia 05/20/2012  . Essential hypertension, benign 05/20/2012    End of Session Activity Tolerance: Patient tolerated treatment well General Behavior During Therapy: [facility] for tasks assessed/performed  GO    Bea Graff, [place], OTR/L [phone]  05/31/2013, 11:57 AM

## 2013-06-02 ENCOUNTER — Ambulatory Visit (HOSPITAL_COMMUNITY)
Admission: RE | Admit: 2013-06-02 | Discharge: 2013-06-02 | Disposition: A | Discharge status: Home / Self Care | Payer: Medicare Other | Source: Ambulatory Visit | Attending: Orthopaedic Surgery | Admitting: Orthopaedic Surgery

## 2013-06-02 DIAGNOSIS — IMO0001 Reserved for inherently not codable concepts without codable children: Secondary | ICD-10-CM | POA: Diagnosis not present

## 2013-06-02 DIAGNOSIS — M25611 Stiffness of right shoulder, not elsewhere classified: Secondary | ICD-10-CM

## 2013-06-02 DIAGNOSIS — M25519 Pain in unspecified shoulder: Secondary | ICD-10-CM

## 2013-06-02 DIAGNOSIS — I10 Essential (primary) hypertension: Secondary | ICD-10-CM | POA: Diagnosis not present

## 2013-06-02 DIAGNOSIS — M6281 Muscle weakness (generalized): Secondary | ICD-10-CM | POA: Diagnosis not present

## 2013-06-02 DIAGNOSIS — E119 Type 2 diabetes mellitus without complications: Secondary | ICD-10-CM | POA: Diagnosis not present

## 2013-06-02 DIAGNOSIS — M47817 Spondylosis without myelopathy or radiculopathy, lumbosacral region: Secondary | ICD-10-CM | POA: Diagnosis not present

## 2013-06-02 DIAGNOSIS — M5380 Other specified dorsopathies, site unspecified: Secondary | ICD-10-CM | POA: Diagnosis not present

## 2013-06-02 DIAGNOSIS — M25619 Stiffness of unspecified shoulder, not elsewhere classified: Secondary | ICD-10-CM | POA: Diagnosis not present

## 2013-06-02 NOTE — Progress Notes (Signed)
Occupational Therapy Treatment Patient Details  Name: Logan Hooper MRN: [id number] Date of Birth: 10/16/1945  Today's Date: 06/02/2013 Time: 2505-3976 OT Time Calculation (min): 47 min Manual therapy [phone] 19' Therapeutic exercises 1322-1350 28' Visit#: 7 of 24  Re-eval: 06/18/13    Authorization: Medicare  Authorization Time Period: before 10th visit  Authorization Visit#: 7 of 10  Subjective  S:  Its hurting more today - starts in my shoulder and then heads down my arm.  Limitations: Protocol:  progress as tolerated. Pain Assessment Pain Score: 1  Pain Location: Shoulder Pain Orientation: Right Pain Type: Acute pain  Precautions/Restrictions   progress as tolerated  Exercise/Treatments Supine Protraction: PROM;10 reps;AAROM;15 reps Horizontal ABduction: PROM;10 reps;AAROM;15 reps External Rotation: PROM;10 reps;AAROM;15 reps Internal Rotation: PROM;10 reps;AAROM;15 reps Flexion: PROM;10 reps;AAROM;15 reps ABduction: PROM;10 reps;AAROM;15 reps Seated Protraction: AAROM;10 reps Horizontal ABduction: AAROM;10 reps External Rotation: AAROM;10 reps Internal Rotation: AAROM;10 reps Flexion: AAROM;10 reps Abduction: AAROM;10 reps Standing Extension: Theraband;15 reps Theraband Level (Shoulder Extension): Level 2 (Red) Row: Theraband;15 reps Theraband Level (Shoulder Row): Level 2 (Red) Retraction: Theraband;15 reps Theraband Level (Shoulder Retraction): Level 2 (Red) Pulleys Flexion: 2 minutes ABduction: 2 minutes Therapy Ball Flexion: 25 reps ABduction: 25 reps ROM / Strengthening / Isometric Strengthening Thumb Tacks: 1' Prot/Ret//Elev/Dep: 1'      Manual Therapy Manual Therapy: Myofascial release Myofascial Release: Myofascial release (MFR) and manual stretching to right upper arm, scapular, and shoulder region to decrease pain and fascial restrictions and increase pain free mobility  Occupational Therapy Assessment and Plan OT Assessment and  Plan Clinical Impression Statement: A:  Added theraband retraction for scapular stability.  MFR to latissimus and teres minor region.  OT Plan: P:  Increase ER by 10 degrees and add therapy ball circles.    Goals Short Term Goals Short Term Goal 1: Patient will be educated on a HEP. Short Term Goal 2: Patient will improve right shoulder and elbow PROM to [facility] for increased ability to reach overhead. Short Term Goal 3: Patient will improve right shoulder strength to 3+/5 for increased ability to lift up his grandson. Short Term Goal 4: Pateint will decrease pain to 3/10 in his right shoulder during functional activities. Short Term Goal 5: Patient will decrease fascial restrictions to min-mod in his right shoulder. Long Term Goals Long Term Goal 1: Patient will return to prior level of independence with all B/IADLs and leisure activities.  Long Term Goal 2: Patient will increase AROM in right shoulder to [facility] in order to reach overhead when completing yardwork and be able to throw beanbags playing cornhole. Long Term Goal 3: Patient will increase right shoulder strength to 5/5 for increased ability to lift tools when completing yardwork. Long Term Goal 4: Patient will decrease right shoulder pain to 2/10 or better while completing daily activities.  Long Term Goal 5: Patient will decrease right shoulder fascial restrictions to minimal.  Problem List Patient Active Problem List   Diagnosis Date Noted  . Pain in joint, shoulder region 05/19/2013  . Decreased range of motion of right shoulder 05/19/2013  . Muscle weakness (generalized) 05/19/2013  . Type II or unspecified type diabetes mellitus without mention of complication, not stated as uncontrolled 05/20/2012  . Other and unspecified hyperlipidemia 05/20/2012  . Insomnia 05/20/2012  . Essential hypertension, benign 05/20/2012    End of Session Activity Tolerance: Patient tolerated treatment well General Behavior During Therapy: [facility]  for tasks assessed/performed  [place], OTR/L [phone]  06/02/2013, 2:15 PM

## 2013-06-05 ENCOUNTER — Ambulatory Visit (HOSPITAL_COMMUNITY)
Admission: RE | Admit: 2013-06-05 | Discharge: 2013-06-05 | Disposition: A | Discharge status: Home / Self Care | Payer: Medicare Other | Source: Ambulatory Visit | Attending: Family Medicine | Admitting: Family Medicine

## 2013-06-05 DIAGNOSIS — M25519 Pain in unspecified shoulder: Secondary | ICD-10-CM

## 2013-06-05 DIAGNOSIS — M25619 Stiffness of unspecified shoulder, not elsewhere classified: Secondary | ICD-10-CM | POA: Insufficient documentation

## 2013-06-05 DIAGNOSIS — M25611 Stiffness of right shoulder, not elsewhere classified: Secondary | ICD-10-CM

## 2013-06-05 DIAGNOSIS — M6281 Muscle weakness (generalized): Secondary | ICD-10-CM | POA: Insufficient documentation

## 2013-06-05 DIAGNOSIS — IMO0001 Reserved for inherently not codable concepts without codable children: Secondary | ICD-10-CM | POA: Insufficient documentation

## 2013-06-05 DIAGNOSIS — I10 Essential (primary) hypertension: Secondary | ICD-10-CM | POA: Diagnosis not present

## 2013-06-05 DIAGNOSIS — E119 Type 2 diabetes mellitus without complications: Secondary | ICD-10-CM | POA: Insufficient documentation

## 2013-06-05 DIAGNOSIS — S43429A Sprain of unspecified rotator cuff capsule, initial encounter: Secondary | ICD-10-CM | POA: Diagnosis present

## 2013-06-05 NOTE — Progress Notes (Signed)
Occupational Therapy Treatment Patient Details  Name: Logan Hooper MRN: [id number] Date of Birth: 1945-10-13  Today's Date: 06/05/2013 Time: 7096-2836 OT Time Calculation (min): 44 min Manual therapy [phone] 17' Therapeutic exercises [phone] 27' Visit#: 8 of 24  Re-eval: 06/18/13    Authorization: Medicare  Authorization Time Period: before 10th visit  Authorization Visit#: 8 of 10  Subjective  S: It still hurts down the side of my arm.  Limitations: Protocol:  progress as tolerated. Pain Assessment Currently in Pain?: Yes Pain Score: 3  Pain Location: Shoulder Pain Orientation: Right Pain Type: Acute pain  Precautions/Restrictions   progress as tolerated  Exercise/Treatments Supine Protraction: PROM;AROM;10 reps Horizontal ABduction: PROM;AROM;10 reps External Rotation: PROM;10 reps;AAROM;15 reps Internal Rotation: PROM;10 reps;AAROM;15 reps Flexion: PROM;10 reps;AAROM;15 reps ABduction: PROM;10 reps;AAROM;15 reps Standing Protraction: AAROM;15 reps Horizontal ABduction: AAROM;15 reps External Rotation: AAROM;15 reps Internal Rotation: AAROM;15 reps Flexion: AAROM;15 reps Therapy Ball Flexion: 25 reps ABduction: 25 reps Right/Left: 5 reps ROM / Strengthening / Isometric Strengthening Wall Wash: 1' Thumb Tacks: 1'      Manual Therapy Manual Therapy: Myofascial release Myofascial Release: Myofascial release (MFR) and manual stretching to right upper arm, scapular, and shoulder region to decrease pain and fascial restrictions and increase pain free mobility  Occupational Therapy Assessment and Plan OT Assessment and Plan Clinical Impression Statement: A:  Added AROM for protraction and horizontal abduction in supine this date and tolerated well. OT Plan: P:  Attempt AROM of flexion, abduction and external and internal rotation in supine.    Goals Short Term Goals Short Term Goal 1: Patient will be educated on a HEP. Short Term Goal 1 Progress:  Progressing toward goal Short Term Goal 2: Patient will improve right shoulder and elbow PROM to [facility] for increased ability to reach overhead. Short Term Goal 2 Progress: Progressing toward goal Short Term Goal 3: Patient will improve right shoulder strength to 3+/5 for increased ability to lift up his grandson. Short Term Goal 3 Progress: Progressing toward goal Short Term Goal 4: Pateint will decrease pain to 3/10 in his right shoulder during functional activities. Short Term Goal 4 Progress: Progressing toward goal Short Term Goal 5: Patient will decrease fascial restrictions to min-mod in his right shoulder. Short Term Goal 5 Progress: Progressing toward goal Long Term Goals Long Term Goal 1: Patient will return to prior level of independence with all B/IADLs and leisure activities.  Long Term Goal 1 Progress: Progressing toward goal Long Term Goal 2: Patient will increase AROM in right shoulder to [facility] in order to reach overhead when completing yardwork and be able to throw beanbags playing cornhole. Long Term Goal 2 Progress: Progressing toward goal Long Term Goal 3: Patient will increase right shoulder strength to 5/5 for increased ability to lift tools when completing yardwork. Long Term Goal 3 Progress: Progressing toward goal Long Term Goal 4: Patient will decrease right shoulder pain to 2/10 or better while completing daily activities.  Long Term Goal 4 Progress: Progressing toward goal Long Term Goal 5: Patient will decrease right shoulder fascial restrictions to minimal. Long Term Goal 5 Progress: Progressing toward goal  Problem List Patient Active Problem List   Diagnosis Date Noted  . Pain in joint, shoulder region 05/19/2013  . Decreased range of motion of right shoulder 05/19/2013  . Muscle weakness (generalized) 05/19/2013  . Type II or unspecified type diabetes mellitus without mention of complication, not stated as uncontrolled 05/20/2012  . Other and unspecified  hyperlipidemia 05/20/2012  .  Insomnia 05/20/2012  . Essential hypertension, benign 05/20/2012    End of Session Activity Tolerance: Patient tolerated treatment well General Behavior During Therapy: [facility] for tasks assessed/performed  [place], OTR/L [phone]  06/05/2013, 11:04 AM

## 2013-06-07 ENCOUNTER — Ambulatory Visit (HOSPITAL_COMMUNITY)
Admission: RE | Admit: 2013-06-07 | Discharge: 2013-06-07 | Disposition: A | Discharge status: Home / Self Care | Payer: Medicare Other | Source: Ambulatory Visit | Attending: Family Medicine | Admitting: Family Medicine

## 2013-06-07 DIAGNOSIS — M25519 Pain in unspecified shoulder: Secondary | ICD-10-CM

## 2013-06-07 DIAGNOSIS — IMO0001 Reserved for inherently not codable concepts without codable children: Secondary | ICD-10-CM | POA: Diagnosis not present

## 2013-06-07 DIAGNOSIS — M25611 Stiffness of right shoulder, not elsewhere classified: Secondary | ICD-10-CM

## 2013-06-07 NOTE — Progress Notes (Signed)
Occupational Therapy Treatment Patient Details  Name: Logan Hooper MRN: [id number] Date of Birth: 11-11-1945  Today's Date: 06/07/2013 Time: 7322-0254 OT Time Calculation (min): 32 min MFR [phone] 12' Therex [phone] 27'  Visit#: 9 of 24  Re-eval: 06/18/13    Authorization: Medicare  Authorization Time Period: before 10th visit  Authorization Visit#: 9 of 10  Subjective Symptoms/Limitations Symptoms: S: I have a little bit of soreness in my arm.  Pain Assessment Currently in Pain?: Yes Pain Score: 1  Pain Location: Shoulder Pain Orientation: Right Pain Type: Acute pain  Precautions/Restrictions  Precautions Precautions: None  Exercise/Treatments Supine Protraction: PROM;10 reps;AROM;12 reps Horizontal ABduction: PROM;10 reps;AROM;12 reps External Rotation: PROM;10 reps;AROM;12 reps Internal Rotation: PROM;10 reps;AROM;12 reps Flexion: PROM;10 reps;AROM;12 reps ABduction: PROM;10 reps;AROM;12 reps Standing Protraction: AAROM;15 reps Horizontal ABduction: AAROM;15 reps External Rotation: AAROM;15 reps Internal Rotation: AAROM;15 reps Flexion: AAROM;15 reps ABduction: AAROM;10 reps Pulleys Flexion: 2 minutes ABduction: 2 minutes ROM / Strengthening / Isometric Strengthening Wall Wash: 1' Proximal Shoulder Strengthening, Supine: 10X        Manual Therapy Manual Therapy: Myofascial release Myofascial Release: Myofascial release (MFR) and manual stretching to right upper arm, scapular, and shoulder region to decrease pain and fascial restrictions and increase pain free mobility  Occupational Therapy Assessment and Plan OT Assessment and Plan Clinical Impression Statement: A: Added AROM supine and proximal shoulder strengthening supine. Patient tolerated well.  OT Plan: P: Attempt AROM standing.   Goals Short Term Goals Time to Complete Short Term Goals: 4 weeks Short Term Goal 1: Patient will be educated on a HEP. Short Term Goal 1 Progress: Progressing  toward goal Short Term Goal 2: Patient will improve right shoulder and elbow PROM to [facility] for increased ability to reach overhead. Short Term Goal 2 Progress: Progressing toward goal Short Term Goal 3: Patient will improve right shoulder strength to 3+/5 for increased ability to lift up his grandson. Short Term Goal 3 Progress: Progressing toward goal Short Term Goal 4: Pateint will decrease pain to 3/10 in his right shoulder during functional activities. Short Term Goal 4 Progress: Progressing toward goal Short Term Goal 5: Patient will decrease fascial restrictions to min-mod in his right shoulder. Short Term Goal 5 Progress: Progressing toward goal Long Term Goals Time to Complete Long Term Goals: 8 weeks Long Term Goal 1: Patient will return to prior level of independence with all B/IADLs and leisure activities.  Long Term Goal 1 Progress: Progressing toward goal Long Term Goal 2: Patient will increase AROM in right shoulder to [facility] in order to reach overhead when completing yardwork and be able to throw beanbags playing cornhole. Long Term Goal 2 Progress: Progressing toward goal Long Term Goal 3: Patient will increase right shoulder strength to 5/5 for increased ability to lift tools when completing yardwork. Long Term Goal 3 Progress: Progressing toward goal Long Term Goal 4: Patient will decrease right shoulder pain to 2/10 or better while completing daily activities.  Long Term Goal 4 Progress: Progressing toward goal Long Term Goal 5: Patient will decrease right shoulder fascial restrictions to minimal. Long Term Goal 5 Progress: Progressing toward goal  Problem List Patient Active Problem List   Diagnosis Date Noted  . Pain in joint, shoulder region 05/19/2013  . Decreased range of motion of right shoulder 05/19/2013  . Muscle weakness (generalized) 05/19/2013  . Type II or unspecified type diabetes mellitus without mention of complication, not stated as uncontrolled 05/20/2012   . Other and unspecified hyperlipidemia 05/20/2012  .  Insomnia 05/20/2012  . Essential hypertension, benign 05/20/2012    End of Session Activity Tolerance: Patient tolerated treatment well General Behavior During Therapy: [facility] for tasks assessed/performed   Ailene Ravel, OTR/L,CBIS   06/07/2013, 10:12 AM

## 2013-06-09 ENCOUNTER — Ambulatory Visit (HOSPITAL_COMMUNITY)
Admission: RE | Admit: 2013-06-09 | Discharge: 2013-06-09 | Disposition: A | Discharge status: Home / Self Care | Payer: Medicare Other | Source: Ambulatory Visit | Attending: Family Medicine | Admitting: Family Medicine

## 2013-06-09 DIAGNOSIS — IMO0001 Reserved for inherently not codable concepts without codable children: Secondary | ICD-10-CM | POA: Diagnosis not present

## 2013-06-09 DIAGNOSIS — M25519 Pain in unspecified shoulder: Secondary | ICD-10-CM

## 2013-06-09 DIAGNOSIS — M25611 Stiffness of right shoulder, not elsewhere classified: Secondary | ICD-10-CM

## 2013-06-09 NOTE — Progress Notes (Signed)
Occupational Therapy Treatment Patient Details  Name: Logan Hooper MRN: [id number] Date of Birth: August 11, 1945  Today's Date: 06/09/2013 Time: 3354-5625 OT Time Calculation (min): 48 min Manual therapy [phone] 21' Therapeutic exercises [phone] 27'  Visit#: 10 of 24  Re-eval: 06/18/13    Authorization: Medicare  Authorization Time Period: before 10th visit  Authorization Visit#: 10 of 20  Subjective  S:  I can tell I can move it more.  Limitations: Protocol:  progress as tolerated. Pain Assessment Currently in Pain?: Yes Pain Score: 1  Pain Location: Shoulder Pain Orientation: Right Pain Type: Acute pain  Precautions/Restrictions   progress as tolerated  Exercise/Treatments Supine Protraction: PROM;10 reps;AROM;12 reps Horizontal ABduction: PROM;10 reps;AROM;12 reps External Rotation: PROM;10 reps;AROM;12 reps Internal Rotation: PROM;10 reps;AROM;12 reps Flexion: PROM;10 reps;AROM;12 reps ABduction: PROM;10 reps;AROM;12 reps Standing Protraction: AROM;10 reps Horizontal ABduction: AROM;10 reps External Rotation: AROM;10 reps Internal Rotation: AROM;10 reps Flexion: AROM;10 reps ABduction: AROM;10 reps Extension: Theraband;10 reps Theraband Level (Shoulder Extension): Level 3 (Green) Row: Theraband;10 reps Theraband Level (Shoulder Row): Level 3 (Green) Retraction: Theraband;10 reps Theraband Level (Shoulder Retraction): Level 3 (Green) ROM / Strengthening / Isometric Strengthening UBE (Upper Arm Bike): level 1 1' forward and 1' reverse Wall Wash: 2' X to V Arms: right arm only 10 times      Manual Therapy Manual Therapy: Myofascial release Myofascial Release: Myofascial release (MFR) and manual stretching to right upper arm, scapular, and shoulder region to decrease pain and fascial restrictions and increase pain free mobility  Occupational Therapy Assessment and Plan OT Assessment and Plan Clinical Impression Statement: A:  Added AROM in standing and x to  v arm.  Added UBE  OT Plan: P:  Attempt cybex press and row, improve PROM and AROM of external rotation by 10 degrees.    Goals Short Term Goals Time to Complete Short Term Goals: 4 weeks Short Term Goal 1: Patient will be educated on a HEP. Short Term Goal 2: Patient will improve right shoulder and elbow PROM to [facility] for increased ability to reach overhead. Short Term Goal 3: Patient will improve right shoulder strength to 3+/5 for increased ability to lift up his grandson. Short Term Goal 4: Pateint will decrease pain to 3/10 in his right shoulder during functional activities. Short Term Goal 5: Patient will decrease fascial restrictions to min-mod in his right shoulder. Long Term Goals Time to Complete Long Term Goals: 8 weeks Long Term Goal 1: Patient will return to prior level of independence with all B/IADLs and leisure activities.  Long Term Goal 2: Patient will increase AROM in right shoulder to [facility] in order to reach overhead when completing yardwork and be able to throw beanbags playing cornhole. Long Term Goal 3: Patient will increase right shoulder strength to 5/5 for increased ability to lift tools when completing yardwork. Long Term Goal 4: Patient will decrease right shoulder pain to 2/10 or better while completing daily activities.  Long Term Goal 5: Patient will decrease right shoulder fascial restrictions to minimal.  Problem List Patient Active Problem List   Diagnosis Date Noted  . Pain in joint, shoulder region 05/19/2013  . Decreased range of motion of right shoulder 05/19/2013  . Muscle weakness (generalized) 05/19/2013  . Type II or unspecified type diabetes mellitus without mention of complication, not stated as uncontrolled 05/20/2012  . Other and unspecified hyperlipidemia 05/20/2012  . Insomnia 05/20/2012  . Essential hypertension, benign 05/20/2012    End of Session Activity Tolerance: Patient tolerated treatment well General  Behavior During Therapy: [facility]  for tasks assessed/performed  GO Functional Assessment Tool Used: clinical judgement  Functional Limitation: Carrying, moving and handling objects Carrying, Moving and Handling Objects Current Status (E0100): At least 20 percent but less than 40 percent impaired, limited or restricted Carrying, Moving and Handling Objects Goal Status [phone]): At least 1 percent but less than 20 percent impaired, limited or restricted  Vangie Bicker, OTR/L [phone]  06/09/2013, 10:19 AM

## 2013-06-12 ENCOUNTER — Ambulatory Visit (HOSPITAL_COMMUNITY)
Admission: RE | Admit: 2013-06-12 | Discharge: 2013-06-12 | Disposition: A | Discharge status: Home / Self Care | Payer: Medicare Other | Source: Ambulatory Visit | Attending: Family Medicine | Admitting: Family Medicine

## 2013-06-12 DIAGNOSIS — IMO0001 Reserved for inherently not codable concepts without codable children: Secondary | ICD-10-CM | POA: Diagnosis not present

## 2013-06-12 DIAGNOSIS — M25519 Pain in unspecified shoulder: Secondary | ICD-10-CM

## 2013-06-12 DIAGNOSIS — M25611 Stiffness of right shoulder, not elsewhere classified: Secondary | ICD-10-CM

## 2013-06-12 NOTE — Progress Notes (Signed)
Occupational Therapy Treatment Patient Details  Name: DAMIER DISANO MRN: [id number] Date of Birth: 1945-09-05  Today's Date: 06/12/2013 Time: 1607-3710 OT Time Calculation (min): 39 min MFR [phone] 9' Therex [phone] 30'   Visit#: 11 of 24  Re-eval: 06/18/13    Authorization: Medicare  Authorization Time Period: before 10th visit  Authorization Visit#: 11 of 20  Subjective Symptoms/Limitations Symptoms: S: It was really sore this weekend but i didn't do anything. Pain Assessment Currently in Pain?: No/denies  Precautions/Restrictions  Precautions Precautions: None  Exercise/Treatments Supine Protraction: PROM;10 reps;AROM;15 reps Horizontal ABduction: PROM;10 reps;AROM;15 reps External Rotation: PROM;10 reps;AROM;15 reps Internal Rotation: PROM;10 reps;AROM;15 reps Flexion: PROM;10 reps;AROM;15 reps ABduction: PROM;10 reps;AROM;15 reps Standing Protraction: AROM;12 reps Horizontal ABduction: AROM;12 reps External Rotation: AROM;12 reps Internal Rotation: AROM;12 reps Flexion: AROM;12 reps ABduction: AROM;12 reps ROM / Strengthening / Isometric Strengthening UBE (Upper Arm Bike): level 1 1' forward and 1' reverse Cybex Press: 2 plate;15 reps Cybex Row: 2 plate;15 reps Wall Wash: 2' X to V Arms: right arm only 10 times Proximal Shoulder Strengthening, Supine: 15X      Manual Therapy Manual Therapy: Myofascial release Myofascial Release: Myofascial release (MFR) and manual stretching to right upper arm, scapular, and shoulder region to decrease pain and fascial restrictions and increase pain free mobility  Occupational Therapy Assessment and Plan OT Assessment and Plan Clinical Impression Statement: A: Added Cybex row/press. Pt tolerated well. No complainted of pain.  OT Plan: P: Reassess for MD appt.   Goals Short Term Goals Time to Complete Short Term Goals: 4 weeks Short Term Goal 1: Patient will be educated on a HEP. Short Term Goal 1 Progress:  Progressing toward goal Short Term Goal 2: Patient will improve right shoulder and elbow PROM to [facility] for increased ability to reach overhead. Short Term Goal 2 Progress: Progressing toward goal Short Term Goal 3: Patient will improve right shoulder strength to 3+/5 for increased ability to lift up his grandson. Short Term Goal 3 Progress: Progressing toward goal Short Term Goal 4: Pateint will decrease pain to 3/10 in his right shoulder during functional activities. Short Term Goal 4 Progress: Progressing toward goal Short Term Goal 5: Patient will decrease fascial restrictions to min-mod in his right shoulder. Short Term Goal 5 Progress: Progressing toward goal Long Term Goals Time to Complete Long Term Goals: 8 weeks Long Term Goal 1: Patient will return to prior level of independence with all B/IADLs and leisure activities.  Long Term Goal 1 Progress: Progressing toward goal Long Term Goal 2: Patient will increase AROM in right shoulder to [facility] in order to reach overhead when completing yardwork and be able to throw beanbags playing cornhole. Long Term Goal 2 Progress: Progressing toward goal Long Term Goal 3: Patient will increase right shoulder strength to 5/5 for increased ability to lift tools when completing yardwork. Long Term Goal 3 Progress: Progressing toward goal Long Term Goal 4: Patient will decrease right shoulder pain to 2/10 or better while completing daily activities.  Long Term Goal 4 Progress: Progressing toward goal Long Term Goal 5: Patient will decrease right shoulder fascial restrictions to minimal. Long Term Goal 5 Progress: Progressing toward goal  Problem List Patient Active Problem List   Diagnosis Date Noted  . Pain in joint, shoulder region 05/19/2013  . Decreased range of motion of right shoulder 05/19/2013  . Muscle weakness (generalized) 05/19/2013  . Type II or unspecified type diabetes mellitus without mention of complication, not stated as uncontrolled  05/20/2012  .  Other and unspecified hyperlipidemia 05/20/2012  . Insomnia 05/20/2012  . Essential hypertension, benign 05/20/2012    End of Session Activity Tolerance: Patient tolerated treatment well General Behavior During Therapy: [facility] for tasks assessed/performed   Ailene Ravel, OTR/L,CBIS   06/12/2013, 10:11 AM

## 2013-06-14 ENCOUNTER — Ambulatory Visit (HOSPITAL_COMMUNITY)
Admission: RE | Admit: 2013-06-14 | Discharge: 2013-06-14 | Disposition: A | Discharge status: Home / Self Care | Payer: Medicare Other | Source: Ambulatory Visit | Attending: Family Medicine | Admitting: Family Medicine

## 2013-06-14 DIAGNOSIS — IMO0001 Reserved for inherently not codable concepts without codable children: Secondary | ICD-10-CM | POA: Diagnosis not present

## 2013-06-14 DIAGNOSIS — M25519 Pain in unspecified shoulder: Secondary | ICD-10-CM

## 2013-06-14 DIAGNOSIS — M25611 Stiffness of right shoulder, not elsewhere classified: Secondary | ICD-10-CM

## 2013-06-14 NOTE — Evaluation (Signed)
Occupational Therapy Re-Evaluation  Patient Details  Name: Logan Hooper MRN: [id number] Date of Birth: [birth year]  Today's Date: 06/14/2013 Time: 0258-5277 OT Time Calculation (min): 37 min Manual therapy [phone] 13' ROM/MMT [phone] 8' Therapeutic exercises 1005-1021 16' Visit#: 12 of 24  Re-eval: 07/12/13  Assessment Diagnosis: S/P  right shoulder scope, manipulation, debridemnt of superior labrum, biceps tendon, biceps tendon release and repari of partial rotator cuff tear.  Authorization: Medicare  Authorization Time Period: before 20th visit  Authorization Visit#: 12 of 20   Past Medical History:  Past Medical History  Diagnosis Date  . Hypertension   . Hyperlipidemia   . Kidney stones   . Diabetes mellitus without complication     Type II  . Arthritis   . ED (erectile dysfunction)   . Sciatica   . PTSD (post-traumatic stress disorder)    Past Surgical History:  Past Surgical History  Procedure Laterality Date  . Back surgery      Subjective Symptoms/Limitations Symptoms: S:  I can't lift anything heavy or get my billfold out of my pocket.  I can sure move it better though.   Limitations: Protocol:  progress as tolerated. Special Tests: FOTO scored 67% independent was 51%. Pain Assessment Currently in Pain?: Yes Pain Score: 1  Pain Location: Shoulder Pain Orientation: Right Pain Type: Acute pain     Assessment Additional Assessments RUE AROM (degrees) RUE Overall AROM Comments: assessed in seated ER/IR with shoulder adducted (supine ER/IR shoulder adducted 05/19/13) Right Shoulder Flexion: 142 Degrees (142) Right Shoulder ABduction: 136 Degrees (90) Right Shoulder Internal Rotation: 76 Degrees (70) Right Shoulder External Rotation: 62 Degrees (20) Right Elbow Flexion:  (WFL) Right Elbow Extension:  (WFL) RUE Strength RUE Overall Strength Comments: strength assessed in seated, ER>IR with shoulder adducted Right Shoulder Flexion: 4/5 Right  Shoulder ABduction: 4/5 Right Shoulder Internal Rotation: 4/5 Right Shoulder External Rotation: 4/5 Palpation Palpation: min-mod fascial restrictions this date.     Exercise/Treatments Standing Protraction: AROM;15 reps Horizontal ABduction: AROM;15 reps External Rotation: AROM;15 reps Internal Rotation: AROM;15 reps Flexion: AROM;15 reps ABduction: AROM;15 reps ROM / Strengthening / Isometric Strengthening Wall Wash: 3' Proximal Shoulder Strengthening, Seated: 10 times each without resting          Manual Therapy Manual Therapy: Myofascial release Myofascial Release: Myofascial release (MFR) and manual stretching to right upper arm, scapular, and shoulder region to decrease pain and fascial restrictions and increase pain free mobility  Occupational Therapy Assessment and Plan OT Assessment and Plan Clinical Impression Statement: A:  Patient has met all short term goals and is working towards long term goals.  Deficits are end range AROM and strength. OT Plan: P:  Continue 3 x week x 4week focusing on end range AROM and strengthening.    Goals Short Term Goals Time to Complete Short Term Goals: 4 weeks Short Term Goal 1: Patient will be educated on a HEP. Short Term Goal 1 Progress: Met Short Term Goal 2: Patient will improve right shoulder and elbow PROM to [facility] for increased ability to reach overhead. Short Term Goal 2 Progress: Met Short Term Goal 3: Patient will improve right shoulder strength to 3+/5 for increased ability to lift up his grandson. Short Term Goal 3 Progress: Met Short Term Goal 4: Pateint will decrease pain to 3/10 in his right shoulder during functional activities. Short Term Goal 4 Progress: Met Short Term Goal 5: Patient will decrease fascial restrictions to min-mod in his right shoulder. Short Term Goal 5 Progress:  Met Long Term Goals Time to Complete Long Term Goals: 8 weeks Long Term Goal 1: Patient will return to prior level of independence  with all B/IADLs and leisure activities.  Long Term Goal 1 Progress: Progressing toward goal Long Term Goal 2: Patient will increase AROM in right shoulder to [facility] in order to reach overhead when completing yardwork and be able to throw beanbags playing cornhole. Long Term Goal 2 Progress: Progressing toward goal Long Term Goal 3: Patient will increase right shoulder strength to 5/5 for increased ability to lift tools when completing yardwork. Long Term Goal 3 Progress: Progressing toward goal Long Term Goal 4: Patient will decrease right shoulder pain to 2/10 or better while completing daily activities.  Long Term Goal 4 Progress: Progressing toward goal Long Term Goal 5: Patient will decrease right shoulder fascial restrictions to minimal. Long Term Goal 5 Progress: Progressing toward goal  Problem List Patient Active Problem List   Diagnosis Date Noted  . Pain in joint, shoulder region 05/19/2013  . Decreased range of motion of right shoulder 05/19/2013  . Muscle weakness (generalized) 05/19/2013  . Type II or unspecified type diabetes mellitus without mention of complication, not stated as uncontrolled 05/20/2012  . Other and unspecified hyperlipidemia 05/20/2012  . Insomnia 05/20/2012  . Essential hypertension, benign 05/20/2012    End of Session Activity Tolerance: Patient tolerated treatment well General Behavior During Therapy: [facility] for tasks assessed/performed  [place], OTR/L [phone]  06/14/2013, 10:48 AM  Physician Documentation Your signature is required to indicate approval of the treatment plan as stated above.  Please sign and either send electronically or make a copy of this report for your files and return this physician signed original.  Please mark one 1.__approve of plan  2. ___approve of plan with the following conditions.   ______________________________                                                           _____________________ Physician Signature                                                                                                             Date

## 2013-06-15 ENCOUNTER — Ambulatory Visit (HOSPITAL_COMMUNITY)
Admission: RE | Admit: 2013-06-15 | Discharge: 2013-06-15 | Disposition: A | Discharge status: Home / Self Care | Payer: Medicare Other | Source: Ambulatory Visit | Attending: Family Medicine | Admitting: Family Medicine

## 2013-06-15 DIAGNOSIS — IMO0001 Reserved for inherently not codable concepts without codable children: Secondary | ICD-10-CM | POA: Diagnosis not present

## 2013-06-15 DIAGNOSIS — M25519 Pain in unspecified shoulder: Secondary | ICD-10-CM | POA: Diagnosis not present

## 2013-06-15 DIAGNOSIS — M25611 Stiffness of right shoulder, not elsewhere classified: Secondary | ICD-10-CM

## 2013-06-15 DIAGNOSIS — M7500 Adhesive capsulitis of unspecified shoulder: Secondary | ICD-10-CM | POA: Insufficient documentation

## 2013-06-15 NOTE — Progress Notes (Signed)
Occupational Therapy Treatment Patient Details  Name: Logan Hooper MRN: [id number] Date of Birth: [birth year]  Today's Date: 06/15/2013 Time: 5176-1607 OT Time Calculation (min): 43 min Manual therapy 3710-6269 16' Therapeutic exercises 4854-6270 27' Visit#: 13 of 24  Re-eval: 07/12/13    Authorization: Medicare  Authorization Time Period: before 20th visit  Authorization Visit#: 13 of 20  Subjective S:  I went to MD, he said I had adhesive capsulitis and to continue therapy.  Pain Assessment Currently in Pain?: Yes Pain Score: 1  Pain Location: Shoulder Pain Orientation: Right Pain Type: Acute pain  Precautions/Restrictions   progress as tolerated  Exercise/Treatments Supine Protraction: PROM;Strengthening;10 reps Protraction Weight (lbs): 1 Horizontal ABduction: PROM;Strengthening;10 reps Horizontal ABduction Weight (lbs): 1 External Rotation: PROM;Strengthening;10 reps External Rotation Weight (lbs): 1 Internal Rotation: PROM;Strengthening;10 reps Internal Rotation Weight (lbs): 1 Flexion: PROM;Strengthening;10 reps Shoulder Flexion Weight (lbs): 1 ABduction: PROM;Strengthening;10 reps Shoulder ABduction Weight (lbs): 1 Standing Protraction: Strengthening;10 reps Protraction Weight (lbs): 1 Horizontal ABduction: Strengthening;10 reps Horizontal ABduction Weight (lbs): 1 External Rotation: Strengthening;10 reps External Rotation Weight (lbs): 1 Internal Rotation: Strengthening;10 reps Internal Rotation Weight (lbs): 1 Flexion: Strengthening;10 reps Shoulder Flexion Weight (lbs): 1 ABduction: Strengthening;10 reps Shoulder ABduction Weight (lbs): 1 ROM / Strengthening / Isometric Strengthening UBE (Upper Arm Bike): level 2' forward, 2' reverse Cybex Press: 2 plate;15 reps Cybex Row: 2 plate;15 reps Wall Wash: add weight next visit Proximal Shoulder Strengthening, Supine: 10 times with 1# Proximal Shoulder Strengthening, Seated: 10 times with 1#        Manual Therapy Manual Therapy: Myofascial release Myofascial Release: Myofascial release (MFR) and manual stretching to right upper arm, scapular, and shoulder region to decrease pain and fascial restrictions and increase pain free mobility  Occupational Therapy Assessment and Plan OT Assessment and Plan Clinical Impression Statement: A:  Presented with script with additional diagnosis of adhesive capsulitis, to continue therapy x 1 month.  Transitioned to strengthening iwth 1# resist this date.  Focused on external and internal rotation with shoulder abducted.   OT Plan: P:  Add weight to wall wash, prone AROM for increased scapular stabiilty, attempt x to v and w arms.    Goals Short Term Goals Time to Complete Short Term Goals: 4 weeks Short Term Goal 1: Patient will be educated on a HEP. Short Term Goal 2: Patient will improve right shoulder and elbow PROM to [facility] for increased ability to reach overhead. Short Term Goal 3: Patient will improve right shoulder strength to 3+/5 for increased ability to lift up his grandson. Short Term Goal 4: Pateint will decrease pain to 3/10 in his right shoulder during functional activities. Short Term Goal 5: Patient will decrease fascial restrictions to min-mod in his right shoulder. Long Term Goals Time to Complete Long Term Goals: 8 weeks Long Term Goal 1: Patient will return to prior level of independence with all B/IADLs and leisure activities.  Long Term Goal 2: Patient will increase AROM in right shoulder to [facility] in order to reach overhead when completing yardwork and be able to throw beanbags playing cornhole. Long Term Goal 3: Patient will increase right shoulder strength to 5/5 for increased ability to lift tools when completing yardwork. Long Term Goal 4: Patient will decrease right shoulder pain to 2/10 or better while completing daily activities.  Long Term Goal 5: Patient will decrease right shoulder fascial restrictions to  minimal.  Problem List Patient Active Problem List   Diagnosis Date Noted  . Adhesive capsulitis of shoulder  06/15/2013  . Pain in joint, shoulder region 05/19/2013  . Decreased range of motion of right shoulder 05/19/2013  . Muscle weakness (generalized) 05/19/2013  . Type II or unspecified type diabetes mellitus without mention of complication, not stated as uncontrolled 05/20/2012  . Other and unspecified hyperlipidemia 05/20/2012  . Insomnia 05/20/2012  . Essential hypertension, benign 05/20/2012    End of Session Activity Tolerance: Patient tolerated treatment well General Behavior During Therapy: [facility] for tasks assessed/performed  [place], OTR/L [phone]  06/15/2013, 4:41 PM

## 2013-06-16 ENCOUNTER — Ambulatory Visit (HOSPITAL_COMMUNITY): Payer: Medicare Other

## 2013-06-19 ENCOUNTER — Ambulatory Visit (HOSPITAL_COMMUNITY)
Admission: RE | Admit: 2013-06-19 | Discharge: 2013-06-19 | Disposition: A | Discharge status: Home / Self Care | Payer: Medicare Other | Source: Ambulatory Visit | Attending: Family Medicine | Admitting: Family Medicine

## 2013-06-19 DIAGNOSIS — M25519 Pain in unspecified shoulder: Secondary | ICD-10-CM

## 2013-06-19 DIAGNOSIS — M25611 Stiffness of right shoulder, not elsewhere classified: Secondary | ICD-10-CM

## 2013-06-19 DIAGNOSIS — M7500 Adhesive capsulitis of unspecified shoulder: Secondary | ICD-10-CM

## 2013-06-19 DIAGNOSIS — IMO0001 Reserved for inherently not codable concepts without codable children: Secondary | ICD-10-CM | POA: Diagnosis not present

## 2013-06-19 NOTE — Progress Notes (Signed)
Occupational Therapy Treatment Patient Details  Name: Logan Hooper MRN: [id number] Date of Birth: 02/19/1945  Today's Date: 06/19/2013 Time: 4166-0630 OT Time Calculation (min): 45 min MFR 930-945 15' Therex 160-1093 30'  Visit#: 14 of 24  Re-eval: 07/12/13    Authorization: Medicare  Authorization Time Period: before 20th visit  Authorization Visit#: 14 of 20  Subjective Symptoms/Limitations Symptoms: S: I'll be coming to therapy for 1 more month and the doctor said that I should be ok to stop therapy.  Pain Assessment Currently in Pain?: Yes Pain Score: 1  Pain Location: Shoulder Pain Orientation: Right Pain Type: Acute pain  Precautions/Restrictions  Precautions Precautions: None  Exercise/Treatments Supine Protraction: PROM;Strengthening;10 reps Protraction Weight (lbs): 1 Horizontal ABduction: PROM;Strengthening;10 reps Horizontal ABduction Weight (lbs): 1 External Rotation: PROM;Strengthening;10 reps External Rotation Weight (lbs): 1 Internal Rotation: PROM;Strengthening;10 reps Internal Rotation Weight (lbs): 1 Flexion: PROM;Strengthening;10 reps Shoulder Flexion Weight (lbs): 1 ABduction: PROM;Strengthening;10 reps Shoulder ABduction Weight (lbs): 1 Prone  Retraction: AROM;10 reps Flexion: AROM;10 reps Extension: AROM;10 reps Other Prone Exercises: Hughston exercises 10X Standing Protraction: Strengthening;10 reps Protraction Weight (lbs): 1 Horizontal ABduction: Strengthening;10 reps Horizontal ABduction Weight (lbs): 1 External Rotation: Strengthening;10 reps External Rotation Weight (lbs): 1 Internal Rotation: Strengthening;10 reps Internal Rotation Weight (lbs): 1 Flexion: Strengthening;10 reps Shoulder Flexion Weight (lbs): 1 ABduction: Strengthening;10 reps Shoulder ABduction Weight (lbs): 1 ROM / Strengthening / Isometric Strengthening UBE (Upper Arm Bike): Level 2 2' forward/ 2' reverse Cybex Press: 2 plate;15 reps Cybex Row: 2  plate;15 reps Wall Wash: 2' with 1# "W" Arms: 10X X to V Arms: 10X Proximal Shoulder Strengthening, Supine: 10 times with 1#      Manual Therapy Manual Therapy: Myofascial release Myofascial Release: Myofascial release (MFR) and manual stretching to right upper arm, scapular, and shoulder region to decrease pain and fascial restrictions and increase pain free mobility  Occupational Therapy Assessment and Plan OT Assessment and Plan Clinical Impression Statement: A: Added prone AROM exercises, X to V arms, W arms, and weighted wall wash. Patient tolerated well. Still limited with external rotation and internal rotation with shoulder abducted. OT Plan: P: Cont to work on increase shoulder stability. Add weight to prone exercises   Goals Short Term Goals Time to Complete Short Term Goals: 4 weeks Short Term Goal 1: Patient will be educated on a HEP. Short Term Goal 2: Patient will improve right shoulder and elbow PROM to [facility] for increased ability to reach overhead. Short Term Goal 3: Patient will improve right shoulder strength to 3+/5 for increased ability to lift up his grandson. Short Term Goal 4: Pateint will decrease pain to 3/10 in his right shoulder during functional activities. Short Term Goal 5: Patient will decrease fascial restrictions to min-mod in his right shoulder. Long Term Goals Time to Complete Long Term Goals: 8 weeks Long Term Goal 1: Patient will return to prior level of independence with all B/IADLs and leisure activities.  Long Term Goal 1 Progress: Progressing toward goal Long Term Goal 2: Patient will increase AROM in right shoulder to [facility] in order to reach overhead when completing yardwork and be able to throw beanbags playing cornhole. Long Term Goal 2 Progress: Progressing toward goal Long Term Goal 3: Patient will increase right shoulder strength to 5/5 for increased ability to lift tools when completing yardwork. Long Term Goal 3 Progress: Progressing toward  goal Long Term Goal 4: Patient will decrease right shoulder pain to 2/10 or better while completing daily activities.  Long Term  Goal 4 Progress: Progressing toward goal Long Term Goal 5: Patient will decrease right shoulder fascial restrictions to minimal. Long Term Goal 5 Progress: Progressing toward goal  Problem List Patient Active Problem List   Diagnosis Date Noted  . Adhesive capsulitis of shoulder 06/15/2013  . Pain in joint, shoulder region 05/19/2013  . Decreased range of motion of right shoulder 05/19/2013  . Muscle weakness (generalized) 05/19/2013  . Type II or unspecified type diabetes mellitus without mention of complication, not stated as uncontrolled 05/20/2012  . Other and unspecified hyperlipidemia 05/20/2012  . Insomnia 05/20/2012  . Essential hypertension, benign 05/20/2012    End of Session Activity Tolerance: Patient tolerated treatment well General Behavior During Therapy: [facility] for tasks assessed/performed   Ailene Ravel, OTR/L,CBIS   06/19/2013, 10:14 AM

## 2013-06-21 ENCOUNTER — Ambulatory Visit (HOSPITAL_COMMUNITY)
Admission: RE | Admit: 2013-06-21 | Discharge: 2013-06-21 | Disposition: A | Discharge status: Home / Self Care | Payer: Medicare Other | Source: Ambulatory Visit | Attending: Family Medicine | Admitting: Family Medicine

## 2013-06-21 DIAGNOSIS — IMO0001 Reserved for inherently not codable concepts without codable children: Secondary | ICD-10-CM | POA: Diagnosis not present

## 2013-06-21 NOTE — Progress Notes (Signed)
Occupational Therapy Treatment Patient Details  Name: MALVIN MORRISH MRN: [id number] Date of Birth: August 25, 1945  Today's Date: 06/21/2013 Time: 7793-9030 OT Time Calculation (min): 43 min Manual 1013-1028 (15') Therapeutic Exercises 1028-1056 (28')  Visit#: 15 of 24  Re-eval: 07/12/13    Authorization: Medicare  Authorization Time Period: before 20th visit  Authorization Visit#: 15 of 20  Subjective Symptoms/Limitations Symptoms: "I'm dong fair.  But I'll still get times when i'll just be witing in my chair and it'll start hurting." Limitations: Protocol:  progress as tolerated. Pain Assessment Currently in Pain?: Yes Pain Score: 1  Pain Location: Shoulder Pain Orientation: Right Pain Type: Acute pain  Exercise/Treatments Supine Protraction: PROM;Strengthening;10 reps Protraction Weight (lbs): 1 Horizontal ABduction: PROM;Strengthening;10 reps Horizontal ABduction Weight (lbs): 1 External Rotation: PROM;Strengthening;10 reps External Rotation Weight (lbs): 1 Internal Rotation: PROM;Strengthening;10 reps Internal Rotation Weight (lbs): 1 Flexion: PROM;Strengthening;10 reps Shoulder Flexion Weight (lbs): 1 ABduction: PROM;Strengthening;10 reps Shoulder ABduction Weight (lbs): 1 Prone  Other Prone Exercises: Hughston exercises 1-6 10x each, with 1# weight Standing Protraction: Strengthening;10 reps Protraction Weight (lbs): 1 Horizontal ABduction: Strengthening;10 reps Horizontal ABduction Weight (lbs): 1 External Rotation: Strengthening;10 reps External Rotation Weight (lbs): 1 Internal Rotation: Strengthening;10 reps Internal Rotation Weight (lbs): 1 Flexion: Strengthening;10 reps Shoulder Flexion Weight (lbs): 1 ABduction: Strengthening;10 reps Shoulder ABduction Weight (lbs): 1 ROM / Strengthening / Isometric Strengthening Cybex Press: 2 plate;20 reps Cybex Row: 2 plate;20 reps "W" Arms: 10X X to V Arms: 10X Proximal Shoulder Strengthening, Supine: 10  times with 1#   Manual Therapy Manual Therapy: Myofascial release Myofascial Release: Myofascial release (MFR) and manual stretching to right upper arm, scapular, and shoulder region to decrease pain and fascial restrictions and increase pain free mobility.    Occupational Therapy Assessment and Plan OT Assessment and Plan Clinical Impression Statement: Added weight to prone, x-v, and w arm exericses.  Pt had good tolerance with them.  increaed reps wth cybex press and row.  Pt bervalizes overall that he can more his arm better. OT Plan: P: Cont to work on increase shoulder stability. Add ball on wall.   Goals Short Term Goals Short Term Goal 1: Patient will be educated on a HEP. Short Term Goal 1 Progress: Met Short Term Goal 2: Patient will improve right shoulder and elbow PROM to [facility] for increased ability to reach overhead. Short Term Goal 2 Progress: Met Short Term Goal 3: Patient will improve right shoulder strength to 3+/5 for increased ability to lift up his grandson. Short Term Goal 3 Progress: Met Short Term Goal 4: Pateint will decrease pain to 3/10 in his right shoulder during functional activities. Short Term Goal 4 Progress: Met Short Term Goal 5: Patient will decrease fascial restrictions to min-mod in his right shoulder. Short Term Goal 5 Progress: Met Long Term Goals Long Term Goal 1: Patient will return to prior level of independence with all B/IADLs and leisure activities.  Long Term Goal 1 Progress: Progressing toward goal Long Term Goal 2: Patient will increase AROM in right shoulder to [facility] in order to reach overhead when completing yardwork and be able to throw beanbags playing cornhole. Long Term Goal 2 Progress: Progressing toward goal Long Term Goal 3: Patient will increase right shoulder strength to 5/5 for increased ability to lift tools when completing yardwork. Long Term Goal 3 Progress: Progressing toward goal Long Term Goal 4: Patient will decrease right  shoulder pain to 2/10 or better while completing daily activities.  Long Term Goal 4  Progress: Progressing toward goal Long Term Goal 5: Patient will decrease right shoulder fascial restrictions to minimal. Long Term Goal 5 Progress: Progressing toward goal  Problem List Patient Active Problem List   Diagnosis Date Noted  . Adhesive capsulitis of shoulder 06/15/2013  . Pain in joint, shoulder region 05/19/2013  . Decreased range of motion of right shoulder 05/19/2013  . Muscle weakness (generalized) 05/19/2013  . Type II or unspecified type diabetes mellitus without mention of complication, not stated as uncontrolled 05/20/2012  . Other and unspecified hyperlipidemia 05/20/2012  . Insomnia 05/20/2012  . Essential hypertension, benign 05/20/2012    End of Session Activity Tolerance: Patient tolerated treatment well General Behavior During Therapy: [facility] for tasks assessed/performed  GO    Bea Graff, MS, OTR/L [phone]  06/21/2013, 10:57 AM

## 2013-06-23 ENCOUNTER — Ambulatory Visit (HOSPITAL_COMMUNITY)
Admission: RE | Admit: 2013-06-23 | Discharge: 2013-06-23 | Disposition: A | Discharge status: Home / Self Care | Payer: Medicare Other | Source: Ambulatory Visit | Attending: Family Medicine | Admitting: Family Medicine

## 2013-06-23 DIAGNOSIS — IMO0001 Reserved for inherently not codable concepts without codable children: Secondary | ICD-10-CM | POA: Diagnosis not present

## 2013-06-23 DIAGNOSIS — M7501 Adhesive capsulitis of right shoulder: Secondary | ICD-10-CM

## 2013-06-23 DIAGNOSIS — M25511 Pain in right shoulder: Secondary | ICD-10-CM

## 2013-06-23 DIAGNOSIS — M25611 Stiffness of right shoulder, not elsewhere classified: Secondary | ICD-10-CM

## 2013-06-23 NOTE — Progress Notes (Signed)
Occupational Therapy Treatment Patient Details  Name: Logan Hooper MRN: [id number] Date of Birth: February 23, 1945  Today's Date: 06/23/2013 Time: 3235-5732 OT Time Calculation (min): 57 min Manual therapy 935-949 14' Therapeutic exercises 202-5427 21' Visit#: 16 of 24  Re-eval: 07/12/13    Authorization: Medicare  Authorization Time Period: before 20th visit  Authorization Visit#: 16 of 20  Subjective S:  "I still get those shooting pains in my right shoulder from time to time." Limitations: Protocol:  progress as tolerated. Pain Assessment Currently in Pain?: Yes Pain Score: 2  Pain Location: Shoulder Pain Orientation: Right Pain Type: Acute pain  Precautions/Restrictions   progress as tolerated  Exercise/Treatments Supine Protraction: PROM;Strengthening;10 reps Protraction Weight (lbs): 2 Horizontal ABduction: PROM;Strengthening;10 reps Horizontal ABduction Weight (lbs): 2 External Rotation: PROM;Strengthening;10 reps External Rotation Weight (lbs): 2 Internal Rotation: PROM;Strengthening;10 reps Internal Rotation Weight (lbs): 2 Flexion: PROM;Strengthening;10 reps Shoulder Flexion Weight (lbs): 2 ABduction: PROM;Strengthening;10 reps Shoulder ABduction Weight (lbs): 2 Standing Protraction: Strengthening;10 reps Protraction Weight (lbs): 2 Horizontal ABduction: Strengthening;10 reps Horizontal ABduction Weight (lbs): 2 External Rotation: Strengthening;10 reps;Theraband;15 reps Theraband Level (Shoulder External Rotation): Level 2 (Red) External Rotation Weight (lbs): 2 Internal Rotation: Strengthening;10 reps;Theraband;15 reps Theraband Level (Shoulder Internal Rotation): Level 2 (Red) Internal Rotation Weight (lbs): 2 Flexion: Strengthening;10 reps Shoulder Flexion Weight (lbs): 2 ABduction: Strengthening;10 reps Shoulder ABduction Weight (lbs): 2 Extension: Theraband;15 reps Theraband Level (Shoulder Extension): Level 2 (Red) Row: Theraband;15  reps Theraband Level (Shoulder Row): Level 2 (Red) Retraction: Theraband;15 reps Theraband Level (Shoulder Retraction): Level 2 (Red) ROM / Strengthening / Isometric Strengthening UBE (Upper Arm Bike): leve 3 3' forward and 3' reverse Cybex Press: 2 plate;15 reps Cybex Row: 2 plate;15 reps Over Head Lace: 2' Proximal Shoulder Strengthening, Supine: 10 tiems with 2# Proximal Shoulder Strengthening, Seated: 10 times with 2# Ball on Wall: 1'      Manual Therapy Manual Therapy: Myofascial release Myofascial Release: Myofascial release (MFR) and manual stretching to right upper arm, scapular, and shoulder region to decrease pain and fascial restrictions and increase pain free mobility.  Occupational Therapy Assessment and Plan OT Assessment and Plan Clinical Impression Statement: A:  Added overhead lace this date. OT Plan: P:  Resume prone exercises add I T Y exercises for scapular stability. give theraband for HEP.   Goals Short Term Goals Short Term Goal 1: Patient will be educated on a HEP. Short Term Goal 2: Patient will improve right shoulder and elbow PROM to [facility] for increased ability to reach overhead. Short Term Goal 3: Patient will improve right shoulder strength to 3+/5 for increased ability to lift up his grandson. Short Term Goal 4: Pateint will decrease pain to 3/10 in his right shoulder during functional activities. Short Term Goal 5: Patient will decrease fascial restrictions to min-mod in his right shoulder. Long Term Goals Long Term Goal 1: Patient will return to prior level of independence with all B/IADLs and leisure activities.  Long Term Goal 2: Patient will increase AROM in right shoulder to [facility] in order to reach overhead when completing yardwork and be able to throw beanbags playing cornhole. Long Term Goal 3: Patient will increase right shoulder strength to 5/5 for increased ability to lift tools when completing yardwork. Long Term Goal 4: Patient will decrease  right shoulder pain to 2/10 or better while completing daily activities.  Long Term Goal 5: Patient will decrease right shoulder fascial restrictions to minimal.  Problem List Patient Active Problem List   Diagnosis Date Noted  .  Adhesive capsulitis of shoulder 06/15/2013  . Pain in joint, shoulder region 05/19/2013  . Decreased range of motion of right shoulder 05/19/2013  . Muscle weakness (generalized) 05/19/2013  . Type II or unspecified type diabetes mellitus without mention of complication, not stated as uncontrolled 05/20/2012  . Other and unspecified hyperlipidemia 05/20/2012  . Insomnia 05/20/2012  . Essential hypertension, benign 05/20/2012    End of Session Activity Tolerance: Patient tolerated treatment well General Behavior During Therapy: [facility] for tasks assessed/performed  [place], OTR/L [phone]  06/23/2013, 10:30 AM

## 2013-06-26 ENCOUNTER — Ambulatory Visit (HOSPITAL_COMMUNITY)
Admission: RE | Admit: 2013-06-26 | Discharge: 2013-06-26 | Disposition: A | Discharge status: Home / Self Care | Payer: Medicare Other | Source: Ambulatory Visit | Attending: Family Medicine | Admitting: Family Medicine

## 2013-06-26 DIAGNOSIS — M25511 Pain in right shoulder: Secondary | ICD-10-CM

## 2013-06-26 DIAGNOSIS — M7501 Adhesive capsulitis of right shoulder: Secondary | ICD-10-CM

## 2013-06-26 DIAGNOSIS — M25611 Stiffness of right shoulder, not elsewhere classified: Secondary | ICD-10-CM

## 2013-06-26 DIAGNOSIS — IMO0001 Reserved for inherently not codable concepts without codable children: Secondary | ICD-10-CM | POA: Diagnosis not present

## 2013-06-26 NOTE — Progress Notes (Signed)
Occupational Therapy Treatment Patient Details  Name: Logan Hooper MRN: [id number] Date of Birth: 1945/12/01  Today's Date: 06/26/2013 Time: 6295-2841 OT Time Calculation (min): 41 min MFR 934-946 13' Therex [id number]  28'  Visit#: 63 of 24  Re-eval: 07/12/13    Authorization: Medicare  Authorization Time Period: before 20th visit  Authorization Visit#: 17 of 20  Subjective Symptoms/Limitations Symptoms: S: My shoulder feels good except for those shooting pains once in a while.  Pain Assessment Currently in Pain?: No/denies  Precautions/Restrictions  Precautions Precautions: None  Exercise/Treatments Supine Protraction: PROM;Strengthening;10 reps Protraction Weight (lbs): 2 Horizontal ABduction: PROM;Strengthening;10 reps Horizontal ABduction Weight (lbs): 2 External Rotation: PROM;Strengthening;10 reps External Rotation Weight (lbs): 2 Internal Rotation: PROM;Strengthening;10 reps Internal Rotation Weight (lbs): 2 Flexion: PROM;Strengthening;10 reps Shoulder Flexion Weight (lbs): 2 ABduction: PROM;Strengthening;10 reps Shoulder ABduction Weight (lbs): 2 Prone  Other Prone Exercises: [place]ston exercises 10X with 2# Standing Protraction: Strengthening;10 reps Protraction Weight (lbs): 2 Horizontal ABduction: Strengthening;10 reps Horizontal ABduction Weight (lbs): 2 External Rotation: Strengthening;10 reps;Theraband;15 reps Theraband Level (Shoulder External Rotation): Level 2 (Red) External Rotation Weight (lbs): 2 Internal Rotation: Strengthening;10 reps;Theraband;15 reps Theraband Level (Shoulder Internal Rotation): Level 2 (Red) Internal Rotation Weight (lbs): 2 Flexion: Strengthening;10 reps Shoulder Flexion Weight (lbs): 2 ABduction: Strengthening;10 reps Shoulder ABduction Weight (lbs): 2 Extension: Theraband;15 reps Theraband Level (Shoulder Extension): Level 2 (Red) Row: Theraband;15 reps Theraband Level (Shoulder Row): Level 2 (Red) Retraction:  Theraband;15 reps Theraband Level (Shoulder Retraction): Level 2 (Red) Other Standing Exercises: I Y T exercises with 2#; 10X ROM / Strengthening / Isometric Strengthening UBE (Upper Arm Bike): level 3 3' forward and 3' reverse Proximal Shoulder Strengthening, Supine: 10 times with 2#      Manual Therapy Manual Therapy: Myofascial release Myofascial Release: Myofascial release (MFR) and manual stretching to right upper arm, scapular, and shoulder region to decrease pain and fascial restrictions and increase pain free mobility.  Occupational Therapy Assessment and Plan OT Assessment and Plan Clinical Impression Statement: A: theraband to HEP. Added I T Y exercises standing.  OT Plan: P: Follow up on theraband HEP. Resume missed exercises.    Goals Short Term Goals Short Term Goal 1: Patient will be educated on a HEP. Short Term Goal 2: Patient will improve right shoulder and elbow PROM to [facility] for increased ability to reach overhead. Short Term Goal 3: Patient will improve right shoulder strength to 3+/5 for increased ability to lift up his grandson. Short Term Goal 4: Pateint will decrease pain to 3/10 in his right shoulder during functional activities. Short Term Goal 5: Patient will decrease fascial restrictions to min-mod in his right shoulder. Long Term Goals Long Term Goal 1: Patient will return to prior level of independence with all B/IADLs and leisure activities.  Long Term Goal 1 Progress: Progressing toward goal Long Term Goal 2: Patient will increase AROM in right shoulder to [facility] in order to reach overhead when completing yardwork and be able to throw beanbags playing cornhole. Long Term Goal 2 Progress: Progressing toward goal Long Term Goal 3: Patient will increase right shoulder strength to 5/5 for increased ability to lift tools when completing yardwork. Long Term Goal 3 Progress: Progressing toward goal Long Term Goal 4: Patient will decrease right shoulder pain to 2/10  or better while completing daily activities.  Long Term Goal 4 Progress: Progressing toward goal Long Term Goal 5: Patient will decrease right shoulder fascial restrictions to minimal. Long Term Goal 5 Progress: Progressing toward goal  Problem  List Patient Active Problem List   Diagnosis Date Noted  . Adhesive capsulitis of shoulder 06/15/2013  . Pain in joint, shoulder region 05/19/2013  . Decreased range of motion of right shoulder 05/19/2013  . Muscle weakness (generalized) 05/19/2013  . Type II or unspecified type diabetes mellitus without mention of complication, not stated as uncontrolled 05/20/2012  . Other and unspecified hyperlipidemia 05/20/2012  . Insomnia 05/20/2012  . Essential hypertension, benign 05/20/2012    End of Session Activity Tolerance: Patient tolerated treatment well General Behavior During Therapy: [facility] for tasks assessed/performed   Ailene Ravel, OTR/L,CBIS   06/26/2013, 10:12 AM

## 2013-06-28 ENCOUNTER — Ambulatory Visit (HOSPITAL_COMMUNITY)
Admission: RE | Admit: 2013-06-28 | Discharge: 2013-06-28 | Disposition: A | Discharge status: Home / Self Care | Payer: Medicare Other | Source: Ambulatory Visit | Attending: Family Medicine | Admitting: Family Medicine

## 2013-06-28 DIAGNOSIS — IMO0001 Reserved for inherently not codable concepts without codable children: Secondary | ICD-10-CM | POA: Diagnosis not present

## 2013-06-28 NOTE — Progress Notes (Signed)
Occupational Therapy Treatment Patient Details  Name: Logan Hooper MRN: [id number] Date of Birth: 05-02-1945  Today's Date: 06/28/2013 Time: 8413-2440 OT Time Calculation (min): 48 min Manual therapy [phone] 20' Therapeutic exercises [phone] 28'  Visit#: 18 of 24  Re-eval: 07/12/13    Authorization: Medicare  Authorization Time Period: before 20th visit  Authorization Visit#: 18 of 20  Subjective S:  I'm just trying to survive this heat. Pain Assessment Currently in Pain?: No/denies Pain Score: 0-No pain  Precautions/Restrictions   progress as tolerated  Exercise/Treatments Supine Protraction: Strengthening;15 reps Protraction Weight (lbs): 2 Horizontal ABduction: Strengthening;15 reps Horizontal ABduction Weight (lbs): 2 External Rotation: Strengthening;15 reps External Rotation Weight (lbs): 2 Internal Rotation: Strengthening;15 reps Internal Rotation Weight (lbs): 2 Flexion: Strengthening;15 reps Shoulder Flexion Weight (lbs): 2 ABduction: Strengthening;15 reps Shoulder ABduction Weight (lbs): 2 Prone  Other Prone Exercises: [place] exercises 10X with 2# with min - mod facilitation Standing Protraction: Strengthening;10 reps Protraction Weight (lbs): 2 Horizontal ABduction: Strengthening;10 reps Horizontal ABduction Weight (lbs): 2 External Rotation: Strengthening;10 reps;Theraband;15 reps Theraband Level (Shoulder External Rotation): Level 2 (Red) External Rotation Weight (lbs): 2 Internal Rotation: Strengthening;10 reps;Theraband;15 reps Theraband Level (Shoulder Internal Rotation): Level 2 (Red) Internal Rotation Weight (lbs): 2 Flexion: Strengthening;10 reps Shoulder Flexion Weight (lbs): 2 ABduction: Strengthening;10 reps Shoulder ABduction Weight (lbs): 2 Extension: Theraband;15 reps Theraband Level (Shoulder Extension): Level 2 (Red) Row: Theraband;15 reps Theraband Level (Shoulder Row): Level 2 (Red) Retraction: Theraband;15 reps Theraband  Level (Shoulder Retraction): Level 2 (Red) Other Standing Exercises: I Y T exercises with 2#; 10X Other Standing Exercises: zip up in standing 10 times ROM / Strengthening / Isometric Strengthening UBE (Upper Arm Bike): level 3 3' forward and 3' reverse Wall Pushups: 10 reps Other ROM/Strengthening Exercises: 2# flexed to 90 then horizontally abducted with 2# 5 times       Manual Therapy Manual Therapy: Myofascial release Myofascial Release: Myofascial release (MFR) and manual stretching to right upper arm, scapular, and shoulder region to decrease pain and fascial restrictions and increase pain free mobility.  Occupational Therapy Assessment and Plan OT Assessment and Plan Clinical Impression Statement: A:  Patient states his arm fatigued after treatment this date.  horizontal abd 1 and 2 very difficult for patient and required min-mod assist from OT for completion.   OT Plan: P:  increase resistance with cybex press and row, increase resistance on UBE.   Goals Short Term Goals Short Term Goal 1: Patient will be educated on a HEP. Short Term Goal 2: Patient will improve right shoulder and elbow PROM to [facility] for increased ability to reach overhead. Short Term Goal 3: Patient will improve right shoulder strength to 3+/5 for increased ability to lift up his grandson. Short Term Goal 4: Pateint will decrease pain to 3/10 in his right shoulder during functional activities. Short Term Goal 5: Patient will decrease fascial restrictions to min-mod in his right shoulder. Long Term Goals Long Term Goal 1: Patient will return to prior level of independence with all B/IADLs and leisure activities.  Long Term Goal 2: Patient will increase AROM in right shoulder to [facility] in order to reach overhead when completing yardwork and be able to throw beanbags playing cornhole. Long Term Goal 3: Patient will increase right shoulder strength to 5/5 for increased ability to lift tools when completing  yardwork. Long Term Goal 4: Patient will decrease right shoulder pain to 2/10 or better while completing daily activities.  Long Term Goal 5: Patient will decrease right shoulder  fascial restrictions to minimal.  Problem List Patient Active Problem List   Diagnosis Date Noted  . Adhesive capsulitis of shoulder 06/15/2013  . Pain in joint, shoulder region 05/19/2013  . Decreased range of motion of right shoulder 05/19/2013  . Muscle weakness (generalized) 05/19/2013  . Type II or unspecified type diabetes mellitus without mention of complication, not stated as uncontrolled 05/20/2012  . Other and unspecified hyperlipidemia 05/20/2012  . Insomnia 05/20/2012  . Essential hypertension, benign 05/20/2012    End of Session Activity Tolerance: Patient tolerated treatment well General Behavior During Therapy: [facility] for tasks assessed/performed OT Plan of Care OT Home Exercise Plan: reviewed tband exercises  [place], OTR/L [phone]  06/28/2013, 10:22 AM

## 2013-06-30 ENCOUNTER — Ambulatory Visit (HOSPITAL_COMMUNITY)
Admission: RE | Admit: 2013-06-30 | Discharge: 2013-06-30 | Disposition: A | Discharge status: Home / Self Care | Payer: Medicare Other | Source: Ambulatory Visit | Attending: Family Medicine | Admitting: Family Medicine

## 2013-06-30 DIAGNOSIS — IMO0001 Reserved for inherently not codable concepts without codable children: Secondary | ICD-10-CM | POA: Diagnosis not present

## 2013-06-30 NOTE — Progress Notes (Signed)
Occupational Therapy Treatment Patient Details  Name: Logan Hooper MRN: [id number] Date of Birth: 1945/01/06  Today's Date: 06/30/2013 Time: 0930-1016 OT Time Calculation (min): 46 min Manual [phone] (10') Therapeutic Exercises [phone] (33')  Visit#: 19 of 24  Re-eval: 07/12/13    Authorization: Medicare  Authorization Time Period: before 20th visit  Authorization Visit#: 19 of 20  Subjective Symptoms/Limitations Symptoms: "I woke up this morning and it was hurting.  I might have slept on it wrong. It just hurts across the top of that joint." Pain Assessment Currently in Pain?: Yes Pain Score: 1  Pain Location: Shoulder Pain Orientation: Right Pain Type: Acute pain  Exercise/Treatments Supine Protraction: PROM;5 reps;Strengthening;15 reps Protraction Weight (lbs): 2 Horizontal ABduction: PROM;10 reps;Strengthening;15 reps Horizontal ABduction Weight (lbs): 2 External Rotation: PROM;10 reps;Strengthening;15 reps External Rotation Weight (lbs): 2 Internal Rotation: PROM;10 reps;Strengthening;15 reps Internal Rotation Weight (lbs): 2 Flexion: PROM;5 reps;Strengthening;15 reps Shoulder Flexion Weight (lbs): 2 ABduction: PROM;10 reps;Strengthening;15 reps Shoulder ABduction Weight (lbs): 2 Prone  Other Prone Exercises: [place] exercises 10X with 2# Standing Protraction: Strengthening;12 reps Protraction Weight (lbs): 2 Horizontal ABduction: Strengthening;12 reps Horizontal ABduction Weight (lbs): 2 External Rotation: Strengthening;12 reps External Rotation Weight (lbs): 2 Internal Rotation: Strengthening;12 reps Internal Rotation Weight (lbs): 2 Flexion: Strengthening;12 reps Shoulder Flexion Weight (lbs): 2 ABduction: Strengthening;12 reps Shoulder ABduction Weight (lbs): 2 ROM / Strengthening / Isometric Strengthening UBE (Upper Arm Bike): level 4, 3' forward, and 3' reverse Cybex Press: 3 plate;15 reps Cybex Row: 3 plate;15 reps Wall Pushups: 10 reps    Manual Therapy Manual Therapy: Myofascial release Myofascial Release: Myofascial release (MFR) and manual stretching to right upper arm, scapular, and shoulder region to decrease pain and fascial restrictions and increase pain free mobility.    Occupational Therapy Assessment and Plan OT Assessment and Plan Clinical Impression Statement: Increased standing weighted reps, and pt had good oelrance, but some fatigue.  Increased resistence with cybed press/row and UBE - pt tolerated well with n complinats of pain. OT Plan: FOTO for g-code update.  functional reaching task   Goals Short Term Goals Short Term Goal 1: Patient will be educated on a HEP. Short Term Goal 1 Progress: Met Short Term Goal 2: Patient will improve right shoulder and elbow PROM to [facility] for increased ability to reach overhead. Short Term Goal 2 Progress: Met Short Term Goal 3: Patient will improve right shoulder strength to 3+/5 for increased ability to lift up his grandson. Short Term Goal 3 Progress: Met Short Term Goal 4: Pateint will decrease pain to 3/10 in his right shoulder during functional activities. Short Term Goal 4 Progress: Met Short Term Goal 5: Patient will decrease fascial restrictions to min-mod in his right shoulder. Short Term Goal 5 Progress: Met Long Term Goals Long Term Goal 1: Patient will return to prior level of independence with all B/IADLs and leisure activities.  Long Term Goal 1 Progress: Progressing toward goal Long Term Goal 2: Patient will increase AROM in right shoulder to [facility] in order to reach overhead when completing yardwork and be able to throw beanbags playing cornhole. Long Term Goal 2 Progress: Progressing toward goal Long Term Goal 3: Patient will increase right shoulder strength to 5/5 for increased ability to lift tools when completing yardwork. Long Term Goal 3 Progress: Progressing toward goal Long Term Goal 4: Patient will decrease right shoulder pain to 2/10 or better while  completing daily activities.  Long Term Goal 4 Progress: Progressing toward goal Long Term Goal 5: Patient  will decrease right shoulder fascial restrictions to minimal. Long Term Goal 5 Progress: Progressing toward goal  Problem List Patient Active Problem List   Diagnosis Date Noted  . Adhesive capsulitis of shoulder 06/15/2013  . Pain in joint, shoulder region 05/19/2013  . Decreased range of motion of right shoulder 05/19/2013  . Muscle weakness (generalized) 05/19/2013  . Type II or unspecified type diabetes mellitus without mention of complication, not stated as uncontrolled 05/20/2012  . Other and unspecified hyperlipidemia 05/20/2012  . Insomnia 05/20/2012  . Essential hypertension, benign 05/20/2012    End of Session Activity Tolerance: Patient tolerated treatment well General Behavior During Therapy: [facility] for tasks assessed/performed  GO    Bea Graff, MS, OTR/L [phone]  06/30/2013, 10:15 AM

## 2013-07-03 ENCOUNTER — Ambulatory Visit (HOSPITAL_COMMUNITY)
Admission: RE | Admit: 2013-07-03 | Discharge: 2013-07-03 | Disposition: A | Discharge status: Home / Self Care | Payer: Medicare Other | Source: Ambulatory Visit | Attending: Family Medicine | Admitting: Family Medicine

## 2013-07-03 DIAGNOSIS — IMO0001 Reserved for inherently not codable concepts without codable children: Secondary | ICD-10-CM | POA: Diagnosis not present

## 2013-07-03 DIAGNOSIS — M7501 Adhesive capsulitis of right shoulder: Secondary | ICD-10-CM

## 2013-07-03 DIAGNOSIS — M25611 Stiffness of right shoulder, not elsewhere classified: Secondary | ICD-10-CM

## 2013-07-03 DIAGNOSIS — M25511 Pain in right shoulder: Secondary | ICD-10-CM

## 2013-07-03 NOTE — Evaluation (Signed)
Occupational Therapy Reassessment and Discharge  Patient Details  Name: Logan Hooper MRN: [id number] Date of Birth: 1945/08/25  Today's Date: 07/03/2013 Time: 3220-2542 OT Time Calculation (min): 36 min MFR [phone] 10' Reassess [phone] 26' Visit#: 20 of 24  Re-eval:    Assessment Diagnosis: S/P  right shoulder scope, manipulation, debridemnt of superior labrum, biceps tendon, biceps tendon release and repari of partial rotator cuff tear.  Authorization: Medicare  Authorization Time Period: before 20th visit  Authorization Visit#: 109 of 20   Past Medical History:  Past Medical History  Diagnosis Date  . Hypertension   . Hyperlipidemia   . Kidney stones   . Diabetes mellitus without complication     Type II  . Arthritis   . ED (erectile dysfunction)   . Sciatica   . PTSD (post-traumatic stress disorder)    Past Surgical History:  Past Surgical History  Procedure Laterality Date  . Back surgery      Subjective Symptoms/Limitations Symptoms: S: My biggest issue is I'm not able to reach back and wipe with this right arm after I use the bathroom.  Pain Assessment Currently in Pain?: No/denies  Precautions/Restrictions  Precautions Precautions: None   Assessment Additional Assessments RUE AROM (degrees) RUE Overall AROM Comments: assessed in seated ER/IR with shoulder adducted (supine ER/IR shoulder adducted 05/19/13) Right Shoulder Flexion: 145 Degrees (last progress note: 142) Right Shoulder ABduction: 140 Degrees (last progress note: 136) Right Shoulder Internal Rotation: 90 Degrees (last progress note: 76) Right Shoulder External Rotation: 75 Degrees (last progress note: 62) RUE Strength RUE Overall Strength Comments: strength assessed in seated, ER>IR with shoulder adducted Right Shoulder Flexion: 5/5 Right Shoulder ABduction:  (4+/5) Right Shoulder Internal Rotation:  (4+/5) Right Shoulder External Rotation: 5/5 Palpation Palpation: Min fascial  restrictions in right upper arm, trapezius, and scapularis region.      Exercise/Treatments    Manual Therapy Manual Therapy: Myofascial release Myofascial Release: Myofascial release (MFR) and manual stretching to right upper arm, scapular, and shoulder region to decrease pain and fascial restrictions and increase pain free mobility.  Occupational Therapy Assessment and Plan OT Assessment and Plan Clinical Impression Statement: A: Reassessment completed this date. Patient met all STGs and 3/5 LTGs with last two goals partly met. Patient was educated on stretches to increase IR/ER. Recommended therapy D/C with patient continuing with HEP at home.  OT Plan: P: D/C from therapy.   Goals Short Term Goals Short Term Goal 1: Patient will be educated on a HEP. Short Term Goal 2: Patient will improve right shoulder and elbow PROM to [facility] for increased ability to reach overhead. Short Term Goal 3: Patient will improve right shoulder strength to 3+/5 for increased ability to lift up his grandson. Short Term Goal 4: Pateint will decrease pain to 3/10 in his right shoulder during functional activities. Short Term Goal 5: Patient will decrease fascial restrictions to min-mod in his right shoulder. Long Term Goals Time to Complete Long Term Goals: 8 weeks Long Term Goal 1: Patient will return to prior level of independence with all B/IADLs and leisure activities.  Long Term Goal 1 Progress: Partly met Long Term Goal 2: Patient will increase AROM in right shoulder to [facility] in order to reach overhead when completing yardwork and be able to throw beanbags playing cornhole. Long Term Goal 2 Progress: Met Long Term Goal 3: Patient will increase right shoulder strength to 5/5 for increased ability to lift tools when completing yardwork. Long Term Goal 3 Progress: Partly  met Long Term Goal 4: Patient will decrease right shoulder pain to 2/10 or better while completing daily activities.  Long Term Goal 4  Progress: Met Long Term Goal 5: Patient will decrease right shoulder fascial restrictions to minimal. Long Term Goal 5 Progress: Met  Problem List Patient Active Problem List   Diagnosis Date Noted  . Adhesive capsulitis of shoulder 06/15/2013  . Pain in joint, shoulder region 05/19/2013  . Decreased range of motion of right shoulder 05/19/2013  . Muscle weakness (generalized) 05/19/2013  . Type II or unspecified type diabetes mellitus without mention of complication, not stated as uncontrolled 05/20/2012  . Other and unspecified hyperlipidemia 05/20/2012  . Insomnia 05/20/2012  . Essential hypertension, benign 05/20/2012    End of Session Activity Tolerance: Patient tolerated treatment well General Behavior During Therapy: [facility] for tasks assessed/performed OT Plan of Care OT Home Exercise Plan: IR/ER stretches OT Patient Instructions: Handout - (scanned) Consulted and Agree with Plan of Care: Patient  GO Functional Assessment Tool Used: FOTO Score: 69/100 (31% impaired) Functional Limitation: Carrying, moving and handling objects Carrying, Moving and Handling Objects Current Status ([id number]): At least 20 percent but less than 40 percent impaired, limited or restricted Carrying, Moving and Handling Objects Goal Status [phone]): At least 1 percent but less than 20 percent impaired, limited or restricted Carrying, Moving and Handling Objects Discharge Status [phone]): At least 20 percent but less than 40 percent impaired, limited or restricted  Ailene Ravel, OTR/L,CBIS   07/03/2013, 12:11 PM  Physician Documentation Your signature is required to indicate approval of the treatment plan as stated above.  Please sign and either send electronically or make a copy of this report for your files and return this physician signed original.  Please mark one 1.__approve of plan  2. ___approve of plan with the following conditions.   ______________________________                                                           _____________________ Physician Signature                                                                                                             Date

## 2013-07-13 ENCOUNTER — Ambulatory Visit (HOSPITAL_COMMUNITY): Payer: Medicare Other

## 2013-07-14 ENCOUNTER — Ambulatory Visit (HOSPITAL_COMMUNITY): Payer: Medicare Other

## 2013-07-17 ENCOUNTER — Ambulatory Visit (HOSPITAL_COMMUNITY): Payer: Medicare Other

## 2013-07-19 ENCOUNTER — Ambulatory Visit (HOSPITAL_COMMUNITY): Payer: Medicare Other

## 2013-07-21 ENCOUNTER — Ambulatory Visit (HOSPITAL_COMMUNITY): Payer: Medicare Other | Admitting: Specialist

## 2013-09-28 ENCOUNTER — Encounter: Payer: Self-pay | Admitting: Family Medicine

## 2013-09-28 ENCOUNTER — Ambulatory Visit (INDEPENDENT_AMBULATORY_CARE_PROVIDER_SITE_OTHER): Payer: Medicare Other | Admitting: Family Medicine

## 2013-09-28 VITALS — BP 130/80 | Ht 71.0 in

## 2013-09-28 DIAGNOSIS — E785 Hyperlipidemia, unspecified: Secondary | ICD-10-CM

## 2013-09-28 DIAGNOSIS — E782 Mixed hyperlipidemia: Secondary | ICD-10-CM

## 2013-09-28 DIAGNOSIS — Z79899 Other long term (current) drug therapy: Secondary | ICD-10-CM | POA: Diagnosis not present

## 2013-09-28 DIAGNOSIS — M25551 Pain in right hip: Secondary | ICD-10-CM

## 2013-09-28 DIAGNOSIS — M25559 Pain in unspecified hip: Secondary | ICD-10-CM

## 2013-09-28 DIAGNOSIS — M25611 Stiffness of right shoulder, not elsewhere classified: Secondary | ICD-10-CM

## 2013-09-28 DIAGNOSIS — M25511 Pain in right shoulder: Secondary | ICD-10-CM

## 2013-09-28 DIAGNOSIS — M259 Joint disorder, unspecified: Secondary | ICD-10-CM

## 2013-09-28 DIAGNOSIS — I10 Essential (primary) hypertension: Secondary | ICD-10-CM

## 2013-09-28 DIAGNOSIS — Z125 Encounter for screening for malignant neoplasm of prostate: Secondary | ICD-10-CM

## 2013-09-28 DIAGNOSIS — M25552 Pain in left hip: Secondary | ICD-10-CM

## 2013-09-28 DIAGNOSIS — E119 Type 2 diabetes mellitus without complications: Secondary | ICD-10-CM | POA: Diagnosis not present

## 2013-09-28 DIAGNOSIS — M25519 Pain in unspecified shoulder: Secondary | ICD-10-CM

## 2013-09-28 LAB — POCT GLYCOSYLATED HEMOGLOBIN (HGB A1C): HEMOGLOBIN A1C: 7.5

## 2013-09-28 NOTE — Progress Notes (Signed)
   Subjective:    Patient ID: Logan Hooper, male    DOB: Apr 01, 1945, 68 y.o.   MRN: [id number]  Diabetes He presents for his follow-up diabetic visit. He has type 2 diabetes mellitus.  Patient states he is having back pain, hip pain & when riding his lawn mower if he passes gas when he gets off he has had a BM in his pants.   Patient has history of low back surgery. In fact went back to Dr. Verita Schneiders after taking a fall. Evaluation with repeat scan showed no prominent hardware.  Patient notes ongoing bilateral hip pain progressive over the past year.  Ongoing right shoulder pain discussed at great length. Persistent after rotator cuff surgery.  Patient adjust his Lantus on his tongue which he claims [facility] told [facility] he can do.  Patient states that his rotary cuff surgery 01/30/2013 doesn't feel any better than before surgery.dr Lorin Mercy did should surg, and hasn't really helped  Glu generally good in the morn   Results for orders placed in visit on 03/14/13  POCT GLYCOSYLATED HEMOGLOBIN (HGB A1C)      Result Value Ref Range   Hemoglobin A1C 6.4        Review of Systems No headache no chest pain no abdominal pain no change in urinary habits no rash ROS otherwise negative    Objective:   Physical Exam Alert vitals stable. Lungs clear. Heart rare rhythm. H&T normal. Rectal exam sphincter somewhat loose. Ankles without edema C. diabetic foot exam.       Assessment & Plan:  Impression #1 type 2 diabetes good control #2 persistent orthopedic concerns and worsening #3 rectal sphincter dysfunction with occasional loss of stools #4 hyperlipidemia status uncertain. #5 hypertension good control. #6 insulin use education plan change Lantus 26 use faithfully maintain other meds. Diet exercise discussed. Orthopedic referral. Recheck in 3 months. Flu shot. WSL easily 40 minutes spent most in discussion

## 2013-09-28 NOTE — Patient Instructions (Signed)
TAKE LANTUS 26 UNITS EVERY NIGHT!!!

## 2013-10-02 DIAGNOSIS — Z79899 Other long term (current) drug therapy: Secondary | ICD-10-CM | POA: Diagnosis not present

## 2013-10-02 DIAGNOSIS — E782 Mixed hyperlipidemia: Secondary | ICD-10-CM | POA: Diagnosis not present

## 2013-10-02 DIAGNOSIS — Z125 Encounter for screening for malignant neoplasm of prostate: Secondary | ICD-10-CM | POA: Diagnosis not present

## 2013-10-02 LAB — BASIC METABOLIC PANEL
BUN: 22 mg/dL (ref 6–23)
CHLORIDE: 104 meq/L (ref 96–112)
CO2: 25 mEq/L (ref 19–32)
Calcium: 9.7 mg/dL (ref 8.4–10.5)
Creat: 1.03 mg/dL (ref 0.50–1.35)
Glucose, Bld: 165 mg/dL — ABNORMAL HIGH (ref 70–99)
POTASSIUM: 4.4 meq/L (ref 3.5–5.3)
Sodium: 141 mEq/L (ref 135–145)

## 2013-10-02 LAB — HEPATIC FUNCTION PANEL
ALT: 16 U/L (ref 0–53)
AST: 21 U/L (ref 0–37)
Albumin: 4.2 g/dL (ref 3.5–5.2)
Alkaline Phosphatase: 59 U/L (ref 39–117)
BILIRUBIN DIRECT: 0.1 mg/dL (ref 0.0–0.3)
BILIRUBIN INDIRECT: 0.5 mg/dL (ref 0.2–1.2)
BILIRUBIN TOTAL: 0.6 mg/dL (ref 0.2–1.2)
TOTAL PROTEIN: 6.5 g/dL (ref 6.0–8.3)

## 2013-10-02 LAB — LIPID PANEL
Cholesterol: 130 mg/dL (ref 0–200)
HDL: 35 mg/dL — ABNORMAL LOW (ref >39)
LDL CALC: 65 mg/dL (ref 0–99)
TRIGLYCERIDES: 152 mg/dL — AB (ref <150)
Total CHOL/HDL Ratio: 3.7 Ratio
VLDL: 30 mg/dL (ref 0–40)

## 2013-10-03 LAB — PSA, MEDICARE: PSA: 2.49 ng/mL (ref <=4.00)

## 2013-10-08 ENCOUNTER — Encounter: Payer: Self-pay | Admitting: Family Medicine

## 2013-10-25 DIAGNOSIS — M488X6 Other specified spondylopathies, lumbar region: Secondary | ICD-10-CM | POA: Diagnosis not present

## 2013-10-25 DIAGNOSIS — M47816 Spondylosis without myelopathy or radiculopathy, lumbar region: Secondary | ICD-10-CM | POA: Diagnosis not present

## 2013-10-30 ENCOUNTER — Ambulatory Visit (HOSPITAL_COMMUNITY)
Admission: RE | Admit: 2013-10-30 | Discharge: 2013-10-30 | Disposition: A | Discharge status: Home / Self Care | Payer: Medicare Other | Source: Ambulatory Visit | Attending: Orthopedic Surgery | Admitting: Orthopedic Surgery

## 2013-10-30 DIAGNOSIS — M7501 Adhesive capsulitis of right shoulder: Secondary | ICD-10-CM | POA: Insufficient documentation

## 2013-10-30 DIAGNOSIS — Z5189 Encounter for other specified aftercare: Secondary | ICD-10-CM | POA: Diagnosis not present

## 2013-10-30 NOTE — Evaluation (Addendum)
Occupational Therapy Evaluation  Patient Details  Name: Logan Hooper MRN: [id number] Date of Birth: 1945-09-13  Today's Date: 10/30/2013 Time: 1520-1600 OT Time Calculation (min): 40 min Eval 1520-1540 (20') Manual 1540-1550 (10') Self Care 1550-1600 (10')  Visit#: 1 of 12  Re-eval: 11/27/13  Assessment Diagnosis: right shoulder adhesive capsulitis Surgical Date:  (prior 01/30/13) Next MD Visit:  (November 23rd) Prior Therapy:  (outpatient OT)  Authorization: Medicare; [employer]; [place]  Authorization Time Period: Before 10th visit  Authorization Visit#: 1 of 10   Past Medical History:  Past Medical History  Diagnosis Date  . Hypertension   . Hyperlipidemia   . Kidney stones   . Diabetes mellitus without complication     Type II  . Arthritis   . ED (erectile dysfunction)   . Sciatica   . PTSD (post-traumatic stress disorder)    Past Surgical History:  Past Surgical History  Procedure Laterality Date  . Back surgery      Subjective Symptoms/Limitations Symptoms: "its was good for awhile. And I was up to using 8# weights.  But then it just got to hurting and I couldn't stand to move it.   Pertinent History: Pt is presenting to outpatient OT with recent diagnosis of adhesive capsulitis of his right shoulder.  Pt had debridement and release of right shoulder in january 2014 and reciived occupational therapy earlier this year, discharging in June.  Pt was able to complete HEP with good results for first month after discharge, but began expeciencing increased pain and decreased mobility.  Saw PA shuford this AM and recieved cortison short in R shoulder.  Pt state he has already noticed some increase in motion. Limitations: Protocol:  progress as tolerated. Special Tests: FOTO 51/100 Patient Stated Goals: I want to be able to play cornhole with my buddies Pain Assessment Currently in Pain?: No/denies Pain Score:  (no pain at rest. with movement  8/10)  Precautions/Restrictions  Precautions Precautions: None Restrictions Weight Bearing Restrictions: No  Balance Screening Balance Screen Has the patient fallen in the past 6 months: Yes (educated pt on availability of PT referral if needed) How many times?: 1 Has the patient had a decrease in activity level because of a fear of falling? : Yes Is the patient reluctant to leave their home because of a fear of falling? : No  Prior [place] expects to be discharged to:: Private residence Prior Function Level of Independence: Independent with basic ADLs;Independent with homemaking with ambulation  Able to Take Stairs?: Yes Driving: Yes Vocation: Retired Leisure: Hobbies-yes (Comment) Comments: Driving around town, hanging out in friend's hot rod shop, playing corn hole, spending time with grandchildren  Assessment ADL/Vision/Perception ADL ADL Comments: pt has increased difficulty reaching out and back, and increased pain with movements.  He is not able to play cornhole.  he has increased difficultyw ashin his back using RUE. he is having difficulty sleeping due to increased pain Dominant Hand: Right Vision - History Baseline Vision: Wears glasses only for reading  Sensation/Coordination/Edema Sensation Light Touch: Appears Intact Additional Comments: With movements into abduction or external rotation, pt had readiating pain down bicep from cusp of shoulder Coordination Gross Motor Movements are Fluid and Coordinated: Yes Fine Motor Movements are Fluid and Coordinated: Yes  Additional Assessments RUE AROM (degrees) RUE Overall AROM Comments: Assessed in standing, with ER/IR shoulder adducted Right Shoulder Flexion: 121 Degrees Right Shoulder ABduction: 105 Degrees Right Shoulder Internal Rotation: 80 Degrees Right Shoulder External Rotation:  51 Degrees RUE PROM (degrees) RUE Overall PROM Comments: assessed in supine ER/IR with shoulder  adducted Right Shoulder Flexion:  (143) Right Shoulder ABduction: 83 Degrees Right Shoulder Internal Rotation: 85 Degrees Right Shoulder External Rotation: 38 Degrees RUE Strength RUE Overall Strength Comments: to be further assessed in current range.  2-/5 due to decreased AROM Palpation Palpation: Mod fascial restrictions in right upper arm, bicep, anterior shoulder, and upper trap regions     Manual Therapy Manual Therapy: Myofascial release Myofascial Release: Myofascial release (MFR) and manual stretching to right upper arm, bicep, anterior shoulder, and scapular regions to decrease pai and fascial restrictions and promote pain free mobility  Occupational Therapy Assessment and Plan OT Assessment and Plan Clinical Impression Statement: Pt is presenting to outpatient OT with right adhesive capsulitis. He received a cortisone shot this AM, and pt stated he has already noticed some improvements in ROM.  Pt has increased pain and fascial restrictions, and decreased ROM in R shoulder, resulting in decreased functional use.  Pt will benefit from skilled therapeutic intervention in order to improve on the following deficits: Decreased range of motion;Decreased strength;Pain;Increased fascial restricitons Rehab Potential: Good OT Frequency: Min 2X/week OT Duration: 4 weeks OT Treatment/Interventions: Self-care/ADL training;Therapeutic exercise;Manual therapy;Modalities;Patient/family education;Therapeutic activities OT Plan: Pt will benefit from skilled OT services to improve right shoulder ROM, fascial rrestrictions, pain, and strength.    Treatment Plan: PROM, AAROM, AROM, stretches, pulleys, proximal shoulder strengthening, scapular strengthening, general strengthening.   Goals Short Term Goals Time to Complete Short Term Goals: 4 weeks Short Term Goal 1: Patient will be educated on a HEP. Short Term Goal 2: Pt will improve right shoulder AROM to [facility] for improved ability toplay corn  hole. Short Term Goal 3: Pt will improve right shoulder strength to at least 4+/5 for improved ability to engage in tasks with his grandchildren. Short Term Goal 4: Pt will decrease pain with movement in right shoulder to less than 3/10. Short Term Goal 5: Pt will decrease fascial restrictions in right shoulder to less than minimal. Additional Short Term Goals?: Yes Short Term Goal 6: Pt will achieve highest level of functioning in all ADL, IADL, and leisure activites.  Problem List Patient Active Problem List   Diagnosis Date Noted  . Adhesive capsulitis of shoulder 06/15/2013  . Pain in joint, shoulder region 05/19/2013  . Decreased range of motion of right shoulder 05/19/2013  . Muscle weakness (generalized) 05/19/2013  . Type II or unspecified type diabetes mellitus without mention of complication, not stated as uncontrolled 05/20/2012  . Other and unspecified hyperlipidemia 05/20/2012  . Insomnia 05/20/2012  . Essential hypertension, benign 05/20/2012    End of Session Activity Tolerance: Patient tolerated treatment well General Behavior During Therapy: [facility] for tasks assessed/performed OT Plan of Care OT Home Exercise Plan: Shoulder Stretches OT Patient Instructions: Explained and demonstrated. Pt verbalized understanding and return demonstrated.  Handout provided (scanned). Consulted and Agree with Plan of Care: Patient  GO Functional Assessment Tool Used: FOTO 51/100 (49% limited) Functional Limitation: Carrying, moving and handling objects Carrying, Moving and Handling Objects Current Status ([id number]): At least 40 percent but less than 60 percent impaired, limited or restricted Carrying, Moving and Handling Objects Goal Status [phone]): At least 20 percent but less than 40 percent impaired, limited or restricted  Pearson Grippe, MS, OTR/L [place] 10/30/2013, 4:49 PM  Physician Documentation Your signature is required to  indicate approval of the treatment plan as stated above.  Please  sign and either send electronically or make a copy of this report for your files and return this physician signed original.  Please mark one 1.__approve of plan  2. ___approve of plan with the following conditions.   ______________________________                                                          _____________________ Physician Signature                                                                                                             Date

## 2013-11-02 ENCOUNTER — Ambulatory Visit (HOSPITAL_COMMUNITY)
Admission: RE | Admit: 2013-11-02 | Discharge: 2013-11-02 | Disposition: A | Discharge status: Home / Self Care | Payer: Medicare Other | Source: Ambulatory Visit | Attending: Family Medicine | Admitting: Family Medicine

## 2013-11-02 DIAGNOSIS — Z5189 Encounter for other specified aftercare: Secondary | ICD-10-CM | POA: Diagnosis not present

## 2013-11-02 NOTE — Progress Notes (Signed)
Occupational Therapy Treatment Patient Details  Name: Logan Hooper MRN: [id number] Date of Birth: Jan 22, 1945  Today's Date: 11/02/2013 Time: 7371-0626 OT Time Calculation (min): 39 min MFR: 9485-4627 20' Therex: 0350-0938 19'  Visit#: 2 of 12  Re-eval: 11/27/13    Authorization: Medicare; [employer]; [place]  Authorization Time Period: Before 10th visit  Authorization Visit#: 2 of 10  Subjective Symptoms/Limitations Symptoms: S: I think the shot on Monday helped. I don't have any pain right now.  Pain Assessment Currently in Pain?: No/denies  Precautions/Restrictions  Precautions Precautions: None  Exercise/Treatments Supine Protraction: PROM;5 reps;AAROM;12 reps Horizontal ABduction: PROM;5 reps;AAROM;12 reps External Rotation: PROM;5 reps;AAROM;12 reps Internal Rotation: PROM;5 reps;AAROM;12 reps Flexion: PROM;5 reps;AAROM;12 reps ABduction: PROM;5 reps;AAROM;12 reps   Standing Protraction: AAROM;12 reps Horizontal ABduction: AAROM;12 reps External Rotation: AAROM;12 reps Internal Rotation: AAROM;12 reps Flexion: AAROM;12 reps ABduction: AAROM;12 reps Extension: Theraband;12 reps Theraband Level (Shoulder Extension): Level 2 (Red) Row: Theraband;12 reps Theraband Level (Shoulder Row): Level 2 (Red)   ROM / Strengthening / Isometric Strengthening Wall Wash: 1' Prot/Ret//Elev/Dep: 1'        Manual Therapy Manual Therapy: Myofascial release Myofascial Release: Myofascial release (MFR) and manual stretching to right upper arm, bicep, anterior shoulder, and scapular regions to decrease pai and fascial restrictions and promote pain free mobility  Occupational Therapy Assessment and Plan OT Assessment and Plan Clinical Impression Statement: A: Initiated myofascial release, manual stretching, AAROM and theraband exercises today. Patient tolerated treatment well. Patient demonstrated difficulty with scapular exercises, specifically retraction, requiring verbal  and tactile cuing for correct completion. Patient reports he is completing his HEP and it is going good so far.  Pt will benefit from skilled therapeutic intervention in order to improve on the following deficits: Decreased range of motion;Decreased strength;Pain;Increased fascial restricitons OT Plan: P: Increase wall wash and pulley time to 1'30". Cont prot/ret/elev/dep focusing on scapular mobility during retraction.    Goals Short Term Goals Time to Complete Short Term Goals: 4 weeks Short Term Goal 1: Patient will be educated on a HEP. Short Term Goal 1 Progress: Progressing toward goal Short Term Goal 2: Pt will improve right shoulder AROM to [facility] for improved ability to play corn hole. Short Term Goal 2 Progress: Progressing toward goal Short Term Goal 3: Pt will improve right shoulder strength to at least 4+/5 for improved ability to engage in tasks with his grandchildren. Short Term Goal 3 Progress: Progressing toward goal Short Term Goal 4: Pt will decrease pain with movement in right shoulder to less than 3/10. Short Term Goal 4 Progress: Progressing toward goal Short Term Goal 5: Pt will decrease fascial restrictions in right shoulder to less than minimal. Short Term Goal 5 Progress: Progressing toward goal Additional Short Term Goals?: Yes Short Term Goal 6: Pt will achieve highest level of functioning in all ADL, IADL, and leisure activites. Short Term Goal 6 Progress: Progressing toward goal  Problem List Patient Active Problem List   Diagnosis Date Noted  . Adhesive capsulitis of shoulder 06/15/2013  . Pain in joint, shoulder region 05/19/2013  . Decreased range of motion of right shoulder 05/19/2013  . Muscle weakness (generalized) 05/19/2013  . Type II or unspecified type diabetes mellitus without mention of complication, not stated as uncontrolled 05/20/2012  . Other and unspecified hyperlipidemia 05/20/2012  . Insomnia 05/20/2012  . Essential hypertension, benign  05/20/2012    End of Session Activity Tolerance: Patient tolerated treatment well General Behavior During Therapy: [facility] for tasks assessed/performed   , OTR/L,CBIS   11/02/2013, 11:11 AM 

## 2013-11-07 ENCOUNTER — Encounter (HOSPITAL_COMMUNITY): Payer: Self-pay

## 2013-11-07 ENCOUNTER — Ambulatory Visit (HOSPITAL_COMMUNITY)
Admission: RE | Admit: 2013-11-07 | Discharge: 2013-11-07 | Disposition: A | Discharge status: Home / Self Care | Payer: Medicare Other | Source: Ambulatory Visit | Attending: Family Medicine | Admitting: Family Medicine

## 2013-11-07 DIAGNOSIS — Z5189 Encounter for other specified aftercare: Secondary | ICD-10-CM | POA: Insufficient documentation

## 2013-11-07 DIAGNOSIS — M7501 Adhesive capsulitis of right shoulder: Secondary | ICD-10-CM | POA: Diagnosis not present

## 2013-11-07 NOTE — Therapy (Addendum)
Occupational Therapy Treatment  Patient Details  Name: Logan Hooper MRN: [id number] Date of Birth: 05-Nov-1945  Encounter Date: 11/07/2013      OT End of Session - 11/07/13 0934    Visit Number 3   Number of Visits 12   Date for OT Re-Evaluation 11/27/13   Authorization Type Medicare; [employer] [place]   Authorization Time Period Before 10th visit   Authorization - Visit Number 3   Authorization - Number of Visits 10   OT Start Time [phone]   OT Stop Time 0925   OT Time Calculation (min) 43 min   Activity Tolerance Patient tolerated treatment well      Past Medical History  Diagnosis Date  . Hypertension   . Hyperlipidemia   . Kidney stones   . Diabetes mellitus without complication     Type II  . Arthritis   . ED (erectile dysfunction)   . Sciatica   . PTSD (post-traumatic stress disorder)     Past Surgical History  Procedure Laterality Date  . Back surgery      There were no vitals taken for this visit.  Visit Diagnosis:  Adhesive capsulitis of right shoulder     11/07/13 0842  Symptoms/Limitations  Symptoms S: I can lay in the bed and turn over and not wake up, which is good.   Pain Assessment  Currently in Pain? No/denies   Precautions: None      OT Treatments/Exercises (OP) - 11/07/13 0700    Protraction PROM;5 reps;AAROM;12 reps   Horizontal ABduction PROM;5 reps;AAROM;12 reps   External Rotation PROM;5 reps;AAROM;12 reps   Internal Rotation PROM;5 reps;AAROM;12 reps   Flexion PROM;5 reps;AAROM;12 reps   ABduction PROM;5 reps;AAROM;12 reps   Protraction AAROM;12 reps   Horizontal ABduction AAROM;12 reps   External Rotation AAROM;12 reps   Internal Rotation AAROM;12 reps   Flexion AAROM;12 reps   ABduction AAROM;12 reps   Row AROM;12 reps   Flexion --  1'30"   ABduction --  1'30"   Wall Wash 1'30"   Prot/Ret//Elev/Dep 1'            OT Short Term Goals - 11/07/13 0937    Title Patient will be educated on a HEP.   Time 4   Period Weeks   Status On-going   Title Pt will improve right shoulder AROM to [facility] for improved ability to play corn hole.   Time 4   Period Weeks   Status On-going   Title Pt will improve right shoulder strength to at least 4+/5 for improved ability to engage in tasks with his grandchildren.   Time 4   Period Weeks   Status On-going   Title Pt will decrease pain with movement in right shoulder to less than 3/10.   Time 4   Period Weeks   Status On-going   Title Pt will decrease fascial restrictions in right shoulder to less than minimal.   Time 4   Period Weeks   Status On-going   Additional Short Term Goals Yes  Pt will decrease fascial restrictions in right shoulder to less than minimal. Ongoing            Plan - 11/07/13 0935    Clinical Impression Statement A: Increased pulleys and wall wash to 1'30". Added AROM row exercise. Patient tolerated treatment well. Patient reports he thinks his arm is feeling better and he is moving it more.    OT Plan P: Ask about advance directives. Increase  AAROM repetitions to 15. Provide AAROM HEP. Add scapular theraband extension & row.         Problem List Patient Active Problem List   Diagnosis Date Noted  . Adhesive capsulitis of shoulder 06/15/2013  . Pain in joint, shoulder region 05/19/2013  . Decreased range of motion of right shoulder 05/19/2013  . Muscle weakness (generalized) 05/19/2013  . Type II or unspecified type diabetes mellitus without mention of complication, not stated as uncontrolled 05/20/2012  . Other and unspecified hyperlipidemia 05/20/2012  . Insomnia 05/20/2012  . Essential hypertension, benign 05/20/2012                                            Ailene Ravel, OTR/L,CBIS   11/07/2013, 9:46 AM

## 2013-11-10 ENCOUNTER — Ambulatory Visit (HOSPITAL_COMMUNITY)
Admission: RE | Admit: 2013-11-10 | Discharge: 2013-11-10 | Disposition: A | Discharge status: Home / Self Care | Payer: Medicare Other | Source: Ambulatory Visit | Attending: Family Medicine | Admitting: Family Medicine

## 2013-11-10 ENCOUNTER — Encounter (HOSPITAL_COMMUNITY): Payer: Self-pay

## 2013-11-10 DIAGNOSIS — M25511 Pain in right shoulder: Secondary | ICD-10-CM

## 2013-11-10 DIAGNOSIS — Z5189 Encounter for other specified aftercare: Secondary | ICD-10-CM | POA: Diagnosis not present

## 2013-11-10 DIAGNOSIS — M7501 Adhesive capsulitis of right shoulder: Secondary | ICD-10-CM

## 2013-11-10 DIAGNOSIS — M6281 Muscle weakness (generalized): Secondary | ICD-10-CM

## 2013-11-10 NOTE — Therapy (Addendum)
Occupational Therapy Treatment  Patient Details  Name: Logan Hooper MRN: [id number] Date of Birth: 1945-10-17  Encounter Date: 11/10/2013      OT End of Session - 11/10/13 1130    Visit Number 4   Number of Visits 12   Date for OT Re-Evaluation 11/27/13   Authorization Type Medicare; [employer] [place]   Authorization Time Period Before 10th visit   Authorization - Visit Number 4   Authorization - Number of Visits 10   OT Start Time [phone]   OT Stop Time 1012   OT Time Calculation (min) 39 min   Activity Tolerance Patient tolerated treatment well      Past Medical History  Diagnosis Date  . Hypertension   . Hyperlipidemia   . Kidney stones   . Diabetes mellitus without complication     Type II  . Arthritis   . ED (erectile dysfunction)   . Sciatica   . PTSD (post-traumatic stress disorder)     Past Surgical History  Procedure Laterality Date  . Back surgery      There were no vitals taken for this visit.  Visit Diagnosis:  Adhesive capsulitis of right shoulder  Pain in joint, shoulder region, right  Muscle weakness (generalized)        [facility] OT Assessment - 11/10/13 1326    Precautions   Precautions None       11/10/13 1127  Symptoms/Limitations  Symptoms S: My shoulder is feeling ok today, it's a little sore after last time.   Pain Assessment  Currently in Pain? Yes  Pain Score 1  Pain Location Shoulder  Pain Orientation Right  Pain Type Acute pain       OT Treatments/Exercises (OP) - 11/10/13 0949    Shoulder Exercises: Supine   Protraction PROM;5 reps;AAROM;15 reps   Horizontal ABduction PROM;5 reps;AAROM;15 reps   External Rotation PROM;5 reps;AAROM;15 reps   Internal Rotation PROM;5 reps;AAROM;15 reps   Flexion PROM;5 reps;AAROM;15 reps   ABduction PROM;5 reps;AAROM;15 reps   Shoulder Exercises: Standing   Protraction AAROM;15 reps   Horizontal ABduction AAROM;15 reps   External Rotation AAROM;15 reps   Internal Rotation AAROM;15  reps   Flexion AAROM;12 reps   ABduction AAROM;15 reps   Extension Theraband;15 reps   Theraband Level (Shoulder Extension) Level 2 (Red)   Row Theraband;15 reps   Theraband Level (Shoulder Row) Level 2 (Red)   Shoulder Exercises: Pulleys   Flexion 2 minutes   ABduction 2 minutes   Shoulder Exercises: ROM/Strengthening   Wall Wash 1'30"   Manual Therapy   Manual Therapy Myofascial release    Myofascial Release: Myofascial release (MFR) and manual stretching to right upper arm, bicep, anterior shoulder, and scapular regions to decrease pain and fascial restrictions and promote pain free mobility      Education - 11/10/13 1041    Education provided Yes   Education Details AAROM HEP   Education Details Patient   Methods Explanation;Demonstration   Comprehension Verbalized understanding          OT Short Term Goals - 11/10/13 1042    OT SHORT TERM GOAL #1   Title Patient will be educated on a HEP.   Time 4   Period Weeks   Status On-going   OT SHORT TERM GOAL #2   Title Pt will improve right shoulder AROM to [facility] for improved ability to play corn hole.   Time 4   Period Weeks   Status On-going  OT SHORT TERM GOAL #3   Title Pt will improve right shoulder strength to at least 4+/5 for improved ability to engage in tasks with his grandchildren.   Time 4   Period Weeks   Status On-going   OT SHORT TERM GOAL #4   Title Pt will decrease pain with movement in right shoulder to less than 3/10.   Time 4   Period Weeks   Status On-going   OT SHORT TERM GOAL #5   Title Pt will decrease fascial restrictions in right shoulder to less than minimal.   Time 4   Period Weeks   Status On-going            Plan - 11/10/13 1130    Clinical Impression Statement A: Increased AAROM to 15X, added theraband extension and row. Patient tolerated well. Pt was given AAROM as HEP.    OT Plan P: Add proximal shoulder strengthening supine and standing.         Problem List Patient  Active Problem List   Diagnosis Date Noted  . Adhesive capsulitis of shoulder 06/15/2013  . Pain in joint, shoulder region 05/19/2013  . Decreased range of motion of right shoulder 05/19/2013  . Muscle weakness (generalized) 05/19/2013  . Type II or unspecified type diabetes mellitus without mention of complication, not stated as uncontrolled 05/20/2012  . Other and unspecified hyperlipidemia 05/20/2012  . Insomnia 05/20/2012  . Essential hypertension, benign 05/20/2012      Ailene Ravel, OTR/L,CBIS   11/10/2013, 1:28 PM

## 2013-11-10 NOTE — Patient Instructions (Signed)
ROM: Flexion - Wand   Bring wand directly over head, leading with right side. Reach back until stretch is felt.  Repeat __10__ times per set. Do __1__ sets per session. Do __1-2__ sessions per day.  http://orth.exer.us/744   Copyright  VHI. All rights reserved.   ROM: Abduction - Wand   Holding wand with left hand palm up, push wand directly out to side, leading with other hand palm down, until stretch is felt.  Repeat __10__ times per set. Do __1__ sets per session. Do _1-2___ sessions per day.  http://orth.exer.us/746   Copyright  VHI. All rights reserved.   ROM: Horizontal Abduction / Adduction - Wand   Keeping both palms down, push right hand across body with other hand. Then pull back across body, keeping arms parallel to floor. Do not allow trunk to twist.  Repeat _10___ times per set. Do __1__ sets per session. Do _1-2___ sessions per day.  http://orth.exer.us/752   Copyright  VHI. All rights reserved.   ROM: External / Internal Rotation - Wand   Holding wand with left hand palm up, push out from body with other hand, palm down. Keep both elbows bent. When stretch is felt, hold ____ seconds. Repeat to other side, leading with same hand. Keep elbows bent. Repeat __10__ times per set. Do __1__ sets per session. Do _1-2___ sessions per day.  http://orth.exer.us/748   Copyright  VHI. All rights reserved.

## 2013-11-13 ENCOUNTER — Ambulatory Visit (HOSPITAL_COMMUNITY)
Admission: RE | Admit: 2013-11-13 | Discharge: 2013-11-13 | Disposition: A | Discharge status: Home / Self Care | Payer: Medicare Other | Source: Ambulatory Visit | Attending: Family Medicine | Admitting: Family Medicine

## 2013-11-13 ENCOUNTER — Encounter (HOSPITAL_COMMUNITY): Payer: Self-pay

## 2013-11-13 DIAGNOSIS — M7501 Adhesive capsulitis of right shoulder: Secondary | ICD-10-CM

## 2013-11-13 DIAGNOSIS — M25511 Pain in right shoulder: Secondary | ICD-10-CM

## 2013-11-13 DIAGNOSIS — Z5189 Encounter for other specified aftercare: Secondary | ICD-10-CM | POA: Diagnosis not present

## 2013-11-13 DIAGNOSIS — M6281 Muscle weakness (generalized): Secondary | ICD-10-CM

## 2013-11-13 DIAGNOSIS — M25611 Stiffness of right shoulder, not elsewhere classified: Secondary | ICD-10-CM

## 2013-11-13 NOTE — Therapy (Signed)
Occupational Therapy Treatment  Patient Details  Name: Logan Hooper MRN: [id number] Date of Birth: 1945/05/07  Encounter Date: 11/13/2013      OT End of Session - 11/13/13 1015    Visit Number 5   Number of Visits 12   Date for OT Re-Evaluation 11/27/13   Authorization Type Medicare; [employer] [place]   Authorization Time Period Before 10th visit   Authorization - Visit Number 5   Authorization - Number of Visits 10   OT Start Time [phone]   OT Stop Time 1012   OT Time Calculation (min) 39 min   Activity Tolerance Patient tolerated treatment well      Past Medical History  Diagnosis Date  . Hypertension   . Hyperlipidemia   . Kidney stones   . Diabetes mellitus without complication     Type II  . Arthritis   . ED (erectile dysfunction)   . Sciatica   . PTSD (post-traumatic stress disorder)     Past Surgical History  Procedure Laterality Date  . Back surgery      There were no vitals taken for this visit.  Visit Diagnosis:  Adhesive capsulitis of right shoulder  Pain in joint, shoulder region, right  Muscle weakness (generalized)  Decreased range of motion of right shoulder   Symptoms/Limitations  Symptoms S: It's got some tightness in it today.    Pain Assessment  Currently in Pain? Yes  Pain Score 1  Pain Location Shoulder  Pain Orientation Right  Pain Type Acute pain         OT Treatments/Exercises (OP) - 11/13/13 0934    Shoulder Exercises: Supine   Protraction PROM;5 reps;AAROM;15 reps   Horizontal ABduction PROM;5 reps;AAROM;15 reps   External Rotation PROM;5 reps;AAROM;15 reps   Internal Rotation PROM;5 reps;AAROM;15 reps   Flexion PROM;5 reps;AAROM;15 reps   ABduction PROM;5 reps;AAROM;15 reps   Shoulder Exercises: Standing   Protraction AAROM;15 reps   Horizontal ABduction AAROM;15 reps   External Rotation AAROM;15 reps   Internal Rotation AAROM;15 reps   Flexion AAROM;12 reps   ABduction AAROM;15 reps   Extension Theraband;15  reps   Theraband Level (Shoulder Extension) Level 2 (Red)   Row Theraband;15 reps   Theraband Level (Shoulder Row) Level 2 (Red)   Retraction Theraband;12 reps   Theraband Level (Shoulder Retraction) Level 2 (Red)   Shoulder Exercises: Pulleys   Flexion 2 minutes   ABduction 2 minutes   Shoulder Exercises: ROM/Strengthening   Proximal Shoulder Strengthening, Supine 12X   Proximal Shoulder Strengthening, Seated 12X   Prot/Ret//Elev/Dep 1'   Manual Therapy   Manual Therapy Myofascial release     Myofascial Release: Myofascial release (MFR) and manual stretching to right upper arm, bicep, anterior shoulder, and scapular regions to decrease pain and fascial restrictions and promote pain free mobility       OT Short Term Goals - 11/13/13 1015    OT SHORT TERM GOAL #1   Title Patient will be educated on a HEP.   Time 4   Period Weeks   Status On-going   OT SHORT TERM GOAL #2   Title Pt will improve right shoulder AROM to [facility] for improved ability to play corn hole.   Time 4   Period Weeks   Status On-going   OT SHORT TERM GOAL #3   Title Pt will improve right shoulder strength to at least 4+/5 for improved ability to engage in tasks with his grandchildren.   Time 4  Period Weeks   Status On-going   OT SHORT TERM GOAL #4   Title Pt will decrease pain with movement in right shoulder to less than 3/10.   Time 4   Period Weeks   Status On-going   OT SHORT TERM GOAL #5   Title Pt will decrease fascial restrictions in right shoulder to less than minimal.   Time 4   Period Weeks   Status On-going            Plan - 11/13/13 1015    Clinical Impression Statement A: Added proximal shoulder strengthening supine and standing. Added retraction with theraband. Patient tolerated well. Patient reports he tried his HEP one time and it went ok.  Patient required verbal and tactile cuing during scapular exercises for correct posture and completion.    OT Plan P: Attempt AROM supine,  continue AAROM during abduction. Continue prot/ret/elev/dep exercise.         Problem List Patient Active Problem List   Diagnosis Date Noted  . Adhesive capsulitis of shoulder 06/15/2013  . Pain in joint, shoulder region 05/19/2013  . Decreased range of motion of right shoulder 05/19/2013  . Muscle weakness (generalized) 05/19/2013  . Type II or unspecified type diabetes mellitus without mention of complication, not stated as uncontrolled 05/20/2012  . Other and unspecified hyperlipidemia 05/20/2012  . Insomnia 05/20/2012  . Essential hypertension, benign 05/20/2012       Ailene Ravel, OTR/L,CBIS   11/13/2013, 10:21 AM

## 2013-11-15 ENCOUNTER — Encounter (HOSPITAL_COMMUNITY): Payer: Self-pay

## 2013-11-15 ENCOUNTER — Ambulatory Visit (HOSPITAL_COMMUNITY)
Admission: RE | Admit: 2013-11-15 | Discharge: 2013-11-15 | Disposition: A | Discharge status: Home / Self Care | Payer: Medicare Other | Source: Ambulatory Visit | Attending: Family Medicine | Admitting: Family Medicine

## 2013-11-15 DIAGNOSIS — Z5189 Encounter for other specified aftercare: Secondary | ICD-10-CM | POA: Diagnosis not present

## 2013-11-15 DIAGNOSIS — M7501 Adhesive capsulitis of right shoulder: Secondary | ICD-10-CM

## 2013-11-15 DIAGNOSIS — M6281 Muscle weakness (generalized): Secondary | ICD-10-CM

## 2013-11-15 DIAGNOSIS — M25511 Pain in right shoulder: Secondary | ICD-10-CM

## 2013-11-15 DIAGNOSIS — M25611 Stiffness of right shoulder, not elsewhere classified: Secondary | ICD-10-CM

## 2013-11-15 NOTE — Addendum Note (Signed)
Encounter addended by: Debby Bud, OT on: 11/15/2013  9:26 AM<BR>     Documentation filed: Clinical Notes

## 2013-11-15 NOTE — Therapy (Signed)
Occupational Therapy Treatment  Patient Details  Name: Logan Hooper MRN: [id number] Date of Birth: [birth year]  Encounter Date: 11/15/2013      OT End of Session - 11/15/13 1013    Visit Number 6   Number of Visits 12   Date for OT Re-Evaluation 11/27/13   Authorization Type Medicare; [employer] [place]   Authorization Time Period Before 10th visit   Authorization - Visit Number 6   Authorization - Number of Visits 10   OT Start Time [phone]   OT Stop Time 1015   OT Time Calculation (min) 36 min   Activity Tolerance Patient tolerated treatment well   Behavior During Therapy [facility] for tasks assessed/performed      Past Medical History  Diagnosis Date  . Hypertension   . Hyperlipidemia   . Kidney stones   . Diabetes mellitus without complication     Type II  . Arthritis   . ED (erectile dysfunction)   . Sciatica   . PTSD (post-traumatic stress disorder)     Past Surgical History  Procedure Laterality Date  . Back surgery      There were no vitals taken for this visit.  Visit Diagnosis:  Adhesive capsulitis of right shoulder  Pain in joint, shoulder region, right  Muscle weakness (generalized)  Decreased range of motion of right shoulder    Symptoms/Limitations  Symptoms S: This cold weather makes it sore.   Pain Assessment  Currently in Pain? No/denies        [facility] OT Assessment - 11/15/13 0001    Precautions   Precautions None          OT Treatments/Exercises (OP) - 11/15/13 0938    Shoulder Exercises: Supine   Protraction PROM;5 reps;AROM;12 reps   Horizontal ABduction PROM;5 reps;AROM;12 reps   External Rotation PROM;5 reps;AROM;12 reps   Internal Rotation PROM;5 reps;AROM;12 reps   Flexion PROM;5 reps;AROM;12 reps   ABduction PROM;5 reps;AROM;12 reps   Shoulder Exercises: Standing   Protraction AAROM;15 reps   Horizontal ABduction AAROM;15 reps   External Rotation AAROM;15 reps   Internal Rotation AAROM;15 reps   Flexion AAROM;12 reps   ABduction AAROM;15 reps   Extension Theraband;15 reps   Theraband Level (Shoulder Extension) Level 2 (Red)   Row Theraband;15 reps   Theraband Level (Shoulder Row) Level 2 (Red)   Shoulder Exercises: Pulleys   ABduction 2 minutes   Shoulder Exercises: ROM/Strengthening   UBE (Upper Arm Bike) Level 1 2' forward 2' reverse   Proximal Shoulder Strengthening, Supine 12X   Proximal Shoulder Strengthening, Seated 12X   Prot/Ret//Elev/Dep 1'   Manual Therapy   Manual Therapy Myofascial release        Myofascial Release: Myofascial release (MFR) and manual stretching to right upper arm, bicep, anterior shoulder, and scapular regions to decrease pain and fascial restrictions and promote pain free mobility        OT Short Term Goals - 11/15/13 0955    OT SHORT TERM GOAL #1   Title Patient will be educated on a HEP.   Time 4   Period Weeks   Status On-going   OT SHORT TERM GOAL #2   Title Pt will improve right shoulder AROM to [facility] for improved ability to play corn hole.   Time 4   Period Weeks   Status On-going   OT SHORT TERM GOAL #3   Title Pt will improve right shoulder strength to at least 4+/5 for improved ability to engage in  tasks with his grandchildren.   Time 4   Period Weeks   Status On-going   OT SHORT TERM GOAL #4   Title Pt will decrease pain with movement in right shoulder to less than 3/10.   Time 4   Period Weeks   Status On-going   OT SHORT TERM GOAL #5   Title Pt will decrease fascial restrictions in right shoulder to less than minimal.   Time 4   Period Weeks   Status On-going            Plan - 11/15/13 1013    Clinical Impression Statement A: Added AROM in supine, UBE bike. Patient tolerated treatment well. Patient reports increased soreness with cold weather. Patient reports he is doing his exercises when he can at home.    Plan P: Add AROM in standing. Increase to green theraband for scapular exercises.         Problem List Patient Active  Problem List   Diagnosis Date Noted  . Adhesive capsulitis of shoulder 06/15/2013  . Pain in joint, shoulder region 05/19/2013  . Decreased range of motion of right shoulder 05/19/2013  . Muscle weakness (generalized) 05/19/2013  . Type II or unspecified type diabetes mellitus without mention of complication, not stated as uncontrolled 05/20/2012  . Other and unspecified hyperlipidemia 05/20/2012  . Insomnia 05/20/2012  . Essential hypertension, benign 05/20/2012       Ailene Ravel, OTR/L,CBIS   11/15/2013, 10:21 AM

## 2013-11-15 NOTE — Addendum Note (Signed)
Encounter addended by: Debby Bud, OT on: 11/15/2013  8:18 AM<BR>     Documentation filed: Clinical Notes

## 2013-11-21 ENCOUNTER — Encounter (HOSPITAL_COMMUNITY): Payer: Self-pay

## 2013-11-21 ENCOUNTER — Ambulatory Visit (HOSPITAL_COMMUNITY)
Admission: RE | Admit: 2013-11-21 | Discharge: 2013-11-21 | Disposition: A | Discharge status: Home / Self Care | Payer: Medicare Other | Source: Ambulatory Visit | Attending: Family Medicine | Admitting: Family Medicine

## 2013-11-21 DIAGNOSIS — M7501 Adhesive capsulitis of right shoulder: Secondary | ICD-10-CM

## 2013-11-21 DIAGNOSIS — M6281 Muscle weakness (generalized): Secondary | ICD-10-CM

## 2013-11-21 DIAGNOSIS — Z5189 Encounter for other specified aftercare: Secondary | ICD-10-CM | POA: Diagnosis not present

## 2013-11-21 DIAGNOSIS — M25511 Pain in right shoulder: Secondary | ICD-10-CM

## 2013-11-21 DIAGNOSIS — M25611 Stiffness of right shoulder, not elsewhere classified: Secondary | ICD-10-CM

## 2013-11-21 NOTE — Therapy (Signed)
Occupational Therapy Treatment  Patient Details  Name: Logan Hooper MRN: [id number] Date of Birth: 1945/01/18  Encounter Date: 11/21/2013      OT End of Session - 11/21/13 1058    Visit Number 7   Number of Visits 12   Date for OT Re-Evaluation 11/27/13   Authorization Type Medicare; [employer] [place]   Authorization Time Period Before 10th visit   Authorization - Visit Number 7   Authorization - Number of Visits 10   OT Start Time 1017   OT Stop Time 1058   OT Time Calculation (min) 41 min   Activity Tolerance Patient tolerated treatment well   Behavior During Therapy [facility] for tasks assessed/performed      Past Medical History  Diagnosis Date  . Hypertension   . Hyperlipidemia   . Kidney stones   . Diabetes mellitus without complication     Type II  . Arthritis   . ED (erectile dysfunction)   . Sciatica   . PTSD (post-traumatic stress disorder)     Past Surgical History  Procedure Laterality Date  . Back surgery      There were no vitals taken for this visit.  Visit Diagnosis:  Pain in joint, shoulder region, right  Adhesive capsulitis of right shoulder  Muscle weakness (generalized)  Decreased range of motion of right shoulder      Subjective Assessment - 11/21/13 1019    Symptoms "I've still got some pain in there when i move it back, but I have a feeling thats just going to be there."   Currently in Pain? Yes   Pain Score 1    Pain Location Shoulder   Pain Orientation Right   Pain Type Acute pain            OT Treatments/Exercises (OP) - 11/21/13 1020    Shoulder Exercises: Supine   Protraction PROM;5 reps;AROM;15 reps   Horizontal ABduction PROM;5 reps;AROM;15 reps   External Rotation PROM;5 reps;AROM;15 reps   Internal Rotation PROM;5 reps;AROM;15 reps   Flexion PROM;5 reps;AROM;15 reps   ABduction PROM;5 reps;AROM;15 reps   Shoulder Exercises: Standing   Protraction AROM;15 reps   Horizontal ABduction AROM;15 reps   External  Rotation AROM;15 reps   Internal Rotation AROM;15 reps   Flexion AROM;15 reps   ABduction AROM;15 reps   Extension Theraband;12 reps   Theraband Level (Shoulder Extension) Level 3 (Green)   Row [employer] reps   Theraband Level (Shoulder Row) Level 3 (Green)   Retraction Theraband;12 reps   Theraband Level (Shoulder Retraction) Level 3 (Green)   Shoulder Exercises: ROM/Strengthening   UBE (Upper Arm Bike) Level 1 2' forward 2' reverse   "W" Arms 10X  1 rest break   X to V Arms 10X   Proximal Shoulder Strengthening, Supine 15x   Proximal Shoulder Strengthening, Seated 15x   Prot/Ret//Elev/Dep 1'   Manual Therapy   Manual Therapy Myofascial release   Myofascial Release Myofascial release (MFR) and manual stretching to right upper arm, bicep, anterior shoulder, and scapular regions to decrease pain and fascial restrictions and promote pain free mobility.              OT Short Term Goals - 11/21/13 1056    OT SHORT TERM GOAL #1   Title Patient will be educated on a HEP.   Status On-going   OT SHORT TERM GOAL #2   Title Pt will improve right shoulder AROM to [facility] for improved ability to play corn hole.  Status On-going   OT SHORT TERM GOAL #3   Title Pt will improve right shoulder strength to at least 4+/5 for improved ability to engage in tasks with his grandchildren.   Status On-going   OT SHORT TERM GOAL #4   Title Pt will decrease pain with movement in right shoulder to less than 3/10.   Status On-going   OT SHORT TERM GOAL #5   Status On-going            Plan - 11/21/13 1059    Clinical Impression Statement Continued supine AROM this session, and added AROM in standing.  Pt tolerated well, with cueing for decreased shoudler elevation and head flexion.  Added s-v and w arms, and pt tolerated well with some fatigue.  Increased theraband resistance to green - pt tolerated well with some fatigue.     Plan P: Re-eval. Attempt 1# weights.  Increase UBE resistance.           Problem List Patient Active Problem List   Diagnosis Date Noted  . Adhesive capsulitis of shoulder 06/15/2013  . Pain in joint, shoulder region 05/19/2013  . Decreased range of motion of right shoulder 05/19/2013  . Muscle weakness (generalized) 05/19/2013  . Type II or unspecified type diabetes mellitus without mention of complication, not stated as uncontrolled 05/20/2012  . Other and unspecified hyperlipidemia 05/20/2012  . Insomnia 05/20/2012  . Essential hypertension, benign 05/20/2012     Bea Graff [place], [place], OTR/L [place] [phone] 11/21/2013, 11:05 AM

## 2013-11-23 ENCOUNTER — Encounter (HOSPITAL_COMMUNITY): Payer: Self-pay

## 2013-11-23 ENCOUNTER — Ambulatory Visit (HOSPITAL_COMMUNITY)
Admission: RE | Admit: 2013-11-23 | Discharge: 2013-11-23 | Disposition: A | Discharge status: Home / Self Care | Payer: Medicare Other | Source: Ambulatory Visit | Attending: Family Medicine | Admitting: Family Medicine

## 2013-11-23 DIAGNOSIS — M25511 Pain in right shoulder: Secondary | ICD-10-CM

## 2013-11-23 DIAGNOSIS — Z5189 Encounter for other specified aftercare: Secondary | ICD-10-CM | POA: Diagnosis not present

## 2013-11-23 DIAGNOSIS — M25611 Stiffness of right shoulder, not elsewhere classified: Secondary | ICD-10-CM

## 2013-11-23 DIAGNOSIS — M7501 Adhesive capsulitis of right shoulder: Secondary | ICD-10-CM

## 2013-11-23 DIAGNOSIS — M6281 Muscle weakness (generalized): Secondary | ICD-10-CM

## 2013-11-23 NOTE — Therapy (Signed)
Occupational Therapy Treatment  Patient Details  Name: Logan Hooper MRN: [id number] Date of Birth: 1945/02/25  Encounter Date: 11/23/2013      OT End of Session - 11/23/13 1113    Visit Number 8   Number of Visits 12   Authorization Type Medicare; [employer]   Authorization Time Period Before 10th visit   Authorization - Visit Number 8   Authorization - Number of Visits 10   OT Start Time 1020   OT Stop Time 1059   OT Time Calculation (min) 39 min   Activity Tolerance Patient tolerated treatment well   Behavior During Therapy WFL for tasks assessed/performed      Past Medical History  Diagnosis Date  . Hypertension   . Hyperlipidemia   . Kidney stones   . Diabetes mellitus without complication     Type II  . Arthritis   . ED (erectile dysfunction)   . Sciatica   . PTSD (post-traumatic stress disorder)     Past Surgical History  Procedure Laterality Date  . Back surgery      There were no vitals taken for this visit.  Visit Diagnosis:  Pain in joint, shoulder region, right  Adhesive capsulitis of right shoulder  Muscle weakness (generalized)  Decreased range of motion of right shoulder      Subjective Assessment - 11/23/13 1019    Symptoms Well, that dampness.  It was hurting a little this morning. You now how it is."   Limitations Protocol:  progress as tolerated.   Currently in Pain? Yes   Pain Score 1    Pain Location Shoulder   Pain Orientation Right   Pain Descriptors / Indicators Aching   Pain Type Acute pain          [facility] OT Assessment - 11/23/13 1036    Precautions   Precautions None   Sensation   Additional Comments Trace fascial restrictions in R upper arm region   AROM   Overall AROM Comments Assessed  in standing, ER/IR in adduction   Right Shoulder Flexion 146 Degrees  121   Right Shoulder ABduction 133 Degrees  105   Right Shoulder Internal Rotation 83 Degrees  80   Right Shoulder External Rotation 58 Degrees  51    PROM   Overall PROM Comments Assessed in supine, with ER/IR adducted   Right Shoulder Flexion 152 Degrees  143   Right Shoulder ABduction 171 Degrees  83   Right Shoulder Internal Rotation 89 Degrees  85   Right Shoulder External Rotation 63 Degrees  38   Strength   Overall Strength Comments Assess with ER/IR adducted   Right Shoulder Flexion 5/5   Right Shoulder ABduction 5/5   Right Shoulder Internal Rotation 5/5   Right Shoulder External Rotation 5/5          OT Treatments/Exercises (OP) - 11/23/13 1020    Shoulder Exercises: Supine   Protraction PROM;5 reps;Strengthening;12 reps;Weights   Protraction Weight (lbs) 1   Horizontal ABduction PROM;5 reps;Strengthening;12 reps   Horizontal ABduction Weight (lbs) 1   External Rotation PROM;5 reps;Strengthening;12 reps   External Rotation Weight (lbs) 1   Internal Rotation PROM;5 reps;Strengthening;12 reps   Internal Rotation Weight (lbs) 1   Flexion PROM;5 reps;Strengthening;12 reps   Shoulder Flexion Weight (lbs) 1   ABduction PROM;5 reps;Strengthening;12 reps   Shoulder ABduction Weight (lbs) 1   Shoulder Exercises: Standing   Protraction Strengthening;12 reps   Protraction Weight (lbs) 1  Horizontal ABduction Strengthening;12 reps   Horizontal ABduction Weight (lbs) 1   External Rotation Strengthening;12 reps   External Rotation Weight (lbs) 1   Internal Rotation Strengthening;12 reps   Internal Rotation Weight (lbs) 1   Flexion Strengthening;12 reps   Shoulder Flexion Weight (lbs) 1   ABduction Strengthening;12 reps   Shoulder ABduction Weight (lbs) 1   Other Standing Exercises throwing 10 beanbags at target to mimic cornhole.  Throwing underhand with OTR wtih weighted green ball 12 reps, with 1/10 pain   Manual Therapy   Manual Therapy Myofascial release   Myofascial Release   Myofascial release (MFR) and manual stretching to right upper arm, bicep, anterior shoulder, and scapular regions to decrease pain and  fascial restrictions and promote pain free mobility.                OT Short Term Goals - 11/23/13 1048    OT SHORT TERM GOAL #1   Title Patient will be educated on a HEP.   Status Achieved   OT SHORT TERM GOAL #2   Title Pt will improve right shoulder AROM to [facility] for improved ability to play corn hole.   Status Achieved   OT SHORT TERM GOAL #3   Title Pt will improve right shoulder strength to at least 4+/5 for improved ability to engage in tasks with his grandchildren.   Status Achieved   OT SHORT TERM GOAL #4   Title Pt will decrease pain with movement in right shoulder to less than 3/10.   Status Achieved   OT SHORT TERM GOAL #5   Title Pt will decrease fascial restrictions in right shoulder to less than minimal.   Status Achieved            Plan - 11/23/13 1113    Clinical Impression Statement Pt has progressed well in therapy, meeting all goals.  He has minimal pain when using arm and demonstrated good skills with all exercises.  attempted motions needed for cornhold, and pt had minimal pain.  Pt has no further questions for OTR at this time.  Educated pt on continuing exercises at home - pt verbalizes understanding.   Plan Pt is discharged from OT services.        Problem List Patient Active Problem List   Diagnosis Date Noted  . Adhesive capsulitis of shoulder 06/15/2013  . Pain in joint, shoulder region 05/19/2013  . Decreased range of motion of right shoulder 05/19/2013  . Muscle weakness (generalized) 05/19/2013  . Type II or unspecified type diabetes mellitus without mention of complication, not stated as uncontrolled 05/20/2012  . Other and unspecified hyperlipidemia 05/20/2012  . Insomnia 05/20/2012  . Essential hypertension, benign 05/20/2012           OCCUPATIONAL THERAPY DISCHARGE SUMMARY  Visits from Start of Care: 8   Current functional level related to goals / functional outcomes: Pt has met all goals, and verbalizes ability to  engage in ADL, IADL, and leisure tasks as needed.  His pain has significantly decreased when engaging in all tasks.   Remaining deficits: Pt has WFL AROM and strength    Education / Equipment: HEP Plan: Patient agrees to discharge.  Patient goals were met. Patient is being discharged due to meeting the stated rehab goals.  ?????            Bea Graff [place], MS, OTR/L [facility] [phone] 11/23/2013, 12:17 PM

## 2013-11-27 ENCOUNTER — Ambulatory Visit (HOSPITAL_COMMUNITY): Payer: Medicare Other

## 2013-11-27 DIAGNOSIS — M7501 Adhesive capsulitis of right shoulder: Secondary | ICD-10-CM | POA: Diagnosis not present

## 2013-11-29 ENCOUNTER — Ambulatory Visit (HOSPITAL_COMMUNITY): Payer: Medicare Other

## 2014-01-01 ENCOUNTER — Ambulatory Visit (INDEPENDENT_AMBULATORY_CARE_PROVIDER_SITE_OTHER): Payer: Medicare Other | Admitting: Family Medicine

## 2014-01-01 ENCOUNTER — Encounter: Payer: Self-pay | Admitting: Family Medicine

## 2014-01-01 VITALS — BP 138/80 | Ht 71.0 in | Wt 251.0 lb

## 2014-01-01 DIAGNOSIS — I10 Essential (primary) hypertension: Secondary | ICD-10-CM | POA: Diagnosis not present

## 2014-01-01 DIAGNOSIS — E1143 Type 2 diabetes mellitus with diabetic autonomic (poly)neuropathy: Secondary | ICD-10-CM | POA: Diagnosis not present

## 2014-01-01 DIAGNOSIS — R079 Chest pain, unspecified: Secondary | ICD-10-CM | POA: Diagnosis not present

## 2014-01-01 DIAGNOSIS — E119 Type 2 diabetes mellitus without complications: Secondary | ICD-10-CM

## 2014-01-01 DIAGNOSIS — Z23 Encounter for immunization: Secondary | ICD-10-CM | POA: Diagnosis not present

## 2014-01-01 LAB — POCT GLYCOSYLATED HEMOGLOBIN (HGB A1C): Hemoglobin A1C: 8.3

## 2014-01-01 NOTE — Addendum Note (Signed)
Addended by: Margaretmary Eddy on: 01/01/2014 11:02 PM   Modules accepted: Orders

## 2014-01-01 NOTE — Patient Instructions (Signed)
Increase Lantus up to 34 units

## 2014-01-01 NOTE — Progress Notes (Signed)
   Subjective:    Patient ID: Logan Hooper, male    DOB: [birth year], 68 y.o.   MRN: [id number]  Diabetes He presents for his follow-up diabetic visit. He has type 2 diabetes mellitus. There are no hypoglycemic associated symptoms. There are no diabetic associated symptoms. Symptoms are stable. Current diabetic treatment includes insulin injections and oral agent (monotherapy). He is compliant with treatment all of the time. He is currently taking insulin at bedtime and pre-breakfast. He monitors blood glucose at home 3-4 x per day. His highest blood glucose is >200 mg/dl. His overall blood glucose range is 110-130 mg/dl. He does not see a podiatrist.Eye exam is not current.   Results for orders placed or performed in visit on 01/01/14  POCT glycosylated hemoglobin (Hb A1C)  Result Value Ref Range   Hemoglobin A1C 8.3    towards in the the visit patient notes chest discomfort. Comes and goes. Lasts for a few minutes. Occasionally associated with shortness of breath. Can occur with or without exertion. No recent cardiac workup.  Would like flu vaccine  Uses the lantus regularly and takes the novolog in the morning, and sometime in eve if elevated  No low sugar spells in the 90s is the low  Compliant with her lipid medicine. Caryl Pina watching his diet in this regard.   Compliant with blood pressure medication. No obvious side effects watching salt intake. Review of Systems No headache no chest pain no back pain no abdominal pain no change in bowel habits no blood in stool    Objective:   Physical Exam Alert no acute distress. Vitals stable. HEENT normal. Lungs clear. Heart regular in rhythm. Ankles without edema. Feet diminished sensation C exam  EKG computer calls atrial fibrillation but small P waves appear to be evident with somewhat wavering baseline. No acute ischemic changes noted nonspecific     Assessment & Plan:  Impression atypical chest pain. However with tremendous number of  cardiac risk factors discussed at length needs intervention #2 type 2 diabetes suboptimum control discussed #3 diabetic neuropathy discussed concerning #4 hypertension decent control. Plan increase Lantus rationale discussed. Maintain other medications. Diet exercise discussed. Cardiology referral. 35-40 minutes spent most in discussion 2 parts. WSL Day # [phone]

## 2014-01-02 ENCOUNTER — Encounter: Payer: Self-pay | Admitting: Family Medicine

## 2014-01-09 DIAGNOSIS — M47816 Spondylosis without myelopathy or radiculopathy, lumbar region: Secondary | ICD-10-CM | POA: Diagnosis not present

## 2014-01-09 DIAGNOSIS — M488X6 Other specified spondylopathies, lumbar region: Secondary | ICD-10-CM | POA: Diagnosis not present

## 2014-01-15 DIAGNOSIS — M47816 Spondylosis without myelopathy or radiculopathy, lumbar region: Secondary | ICD-10-CM | POA: Diagnosis not present

## 2014-01-15 DIAGNOSIS — M5386 Other specified dorsopathies, lumbar region: Secondary | ICD-10-CM | POA: Diagnosis not present

## 2014-01-15 DIAGNOSIS — M4326 Fusion of spine, lumbar region: Secondary | ICD-10-CM | POA: Diagnosis not present

## 2014-01-15 DIAGNOSIS — M5126 Other intervertebral disc displacement, lumbar region: Secondary | ICD-10-CM | POA: Diagnosis not present

## 2014-01-18 ENCOUNTER — Encounter: Payer: Self-pay | Admitting: Cardiology

## 2014-01-18 ENCOUNTER — Ambulatory Visit (INDEPENDENT_AMBULATORY_CARE_PROVIDER_SITE_OTHER): Payer: Medicare Other | Admitting: Cardiology

## 2014-01-18 VITALS — BP 160/72 | HR 87 | Ht 72.0 in | Wt 252.4 lb

## 2014-01-18 DIAGNOSIS — E1143 Type 2 diabetes mellitus with diabetic autonomic (poly)neuropathy: Secondary | ICD-10-CM

## 2014-01-18 DIAGNOSIS — I10 Essential (primary) hypertension: Secondary | ICD-10-CM | POA: Diagnosis not present

## 2014-01-18 DIAGNOSIS — R072 Precordial pain: Secondary | ICD-10-CM | POA: Insufficient documentation

## 2014-01-18 NOTE — Assessment & Plan Note (Signed)
Patient continues on insulin and oral hypoglycemic agents. Recent hemoglobin A1c 7.5. Keep follow-up with Dr. Wolfgang Phoenix.

## 2014-01-18 NOTE — Assessment & Plan Note (Signed)
Blood pressure elevated today. He is on Norvasc, Cozaar, and Prazosin. Keep follow-up with Dr. Wolfgang Phoenix.

## 2014-01-18 NOTE — Patient Instructions (Signed)
Your physician recommends that you schedule a follow-up appointment in: to be determined after stress test. We will call you with the results.    Your physician has requested that you have a lexiscan myoview. For further information please visit [email or web address]. Please follow instruction sheet, as given.    Your physician recommends that you continue on your current medications as directed. Please refer to the Current Medication list given to you today.      Thank you for choosing [facility] !

## 2014-01-18 NOTE — Progress Notes (Signed)
Reason for visit: Chest pain  Clinical Summary Logan Hooper is a 69 y.o.male referred for cardiology consultation by Dr. Wolfgang Phoenix. He reports a fairly long-standing history of intermittent, left-sided thoracic discomfort. He states that the symptoms are usually "sharp" and precipitated at times by taking a deep breath. There is no distinct correlation with exertion. Usually the symptoms are short, lasting seconds to a minute. There is no definite alleviating factor, no associated cough or fevers. Prior history is outlined below including left-sided empyema surgery in 2009 by Dr. Arlyce Dice status post VATS procedure. This region is generally where he has his chest pain although sometimes it does radiate across the chest.    ECG today in the office shows sinus rhythm with LVH. He reports undergoing stress testing several years ago the the [facility] system, sounds like it was a pharmacologic stress test prior to surgery. He does not recall any abnormal results. He has not had any ischemic testing a last 5 years. Cardiovascular risk factors include age and gender, hyperlipidemia, hypertension, type 2 diabetes mellitus, and also family history of premature CAD in his mother. He states that he is limited by arthritis in his knees, and also chronic lower back pain.  Lab work from September 2015 showed cholesterol 1:30, triglycerides 152, HDL 35, LDL 65, normal LFTs, potassium 4.4, BUN 22, creatinine 1.0, hemoglobin A1c 7.5.   No Known Allergies  Current Outpatient Prescriptions  Medication Sig Dispense Refill  . amLODipine (NORVASC) 10 MG tablet Take 10 mg by mouth daily.    Marland Kitchen aspirin 81 MG tablet Take 81 mg by mouth daily.    . baclofen (LIORESAL) 10 MG tablet Take 10 mg by mouth 3 (three) times daily.    . diclofenac (VOLTAREN) 75 MG EC tablet Take 1 tablet (75 mg total) by mouth 2 (two) times daily. 28 tablet 1  . fish oil-omega-3 fatty acids 1000 MG capsule Take 1,000 mg by mouth 2 (two) times daily.      Marland Kitchen HYDROcodone-acetaminophen (NORCO) 10-325 MG per tablet Take 1 tablet by mouth every 12 (twelve) hours.    . insulin aspart (NOVOLOG) 100 UNIT/ML injection Sliding scale with meals    . insulin glargine (LANTUS) 100 UNIT/ML injection Inject 25 Units into the skin at bedtime.     Marland Kitchen losartan (COZAAR) 100 MG tablet Take 100 mg by mouth daily.    . metFORMIN (GLUCOPHAGE) 1000 MG tablet Take 1,000 mg by mouth 2 (two) times daily with a meal.    . Multiple Vitamins-Minerals (MULTIVITAMINS THER. W/MINERALS) TABS Take 1 tablet by mouth daily.    . pioglitazone (ACTOS) 15 MG tablet Take 15 mg by mouth daily.    . prazosin (MINIPRESS) 1 MG capsule Take 1 mg by mouth. Take 2 tablets qhs    . simvastatin (ZOCOR) 80 MG tablet Take 80 mg by mouth. Take 1/2 tablet qhs    . traZODone (DESYREL) 150 MG tablet Take 150 mg by mouth. Take one third to one half by mouth qhs and may repeat one time     No current facility-administered medications for this visit.    Past Medical History  Diagnosis Date  . Essential hypertension   . Hyperlipidemia   . Nephrolithiasis   . Type 2 diabetes mellitus with peripheral neuropathy   . Arthritis   . ED (erectile dysfunction)   . Sciatica   . PTSD (post-traumatic stress disorder)     Past Surgical History  Procedure Laterality Date  . Back surgery    .  Video assisted thoracoscopy (vats)/decortication Left 2009    Dr. Arlyce Dice  -left-sided empyema    Family History  Problem Relation Age of Onset  . Heart disease Mother     Reportedly diagnosed in her 65s  . Dementia Father     Social History [name] reports that he quit smoking about 7 years ago. His smoking use included Cigarettes and Cigars. He started smoking about 52 years ago. He does not have any smokeless tobacco history on file. [name] reports that he does not drink alcohol.  Review of Systems Complete review of systems negative except as otherwise outlined in the clinical summary and also  the following. No palpitations or syncope. No distinct claudication. No orthopnea or PND.  Physical Examination Filed Vitals:   01/18/14 0834  BP: 160/72  Pulse: 87   Filed Weights   01/18/14 0834  Weight: 252 lb 6.4 oz (114.488 kg)   Obese male, appears comfortable at rest. HEENT: Conjunctiva and lids normal, oropharynx clear. Neck: Supple, no elevated JVP or carotid bruits, no thyromegaly. Lungs: Clear to auscultation with decreased breath sounds upper lung zones, nonlabored breathing at rest. Cardiac: Regular rate and rhythm, no S3, 4-6/2 systolic murmur, no pericardial rub. Thorax: Well-healed left lateral incision. Abdomen: Soft, nontender, bowel sounds present, no guarding or rebound. Extremities: No pitting edema, distal pulses 2+. Skin: Warm and dry. Musculoskeletal: No kyphosis. Neuropsychiatric: Alert and oriented x3, affect grossly appropriate.   Problem List and Plan   Precordial pain Very atypical in description for ischemia. ECG is nonspecific showing LVH. He does have a number of cardiac risk factors as detailed above at baseline. No ischemic testing at least within the last 5 years. Plan will be to obtain a [place] for further risk stratification. Unless this shows substantial abnormalities, would likely continue medical therapy and risk factor modification strategies. We will call him with the results.   Essential hypertension, benign Blood pressure elevated today. He is on Norvasc, Cozaar, and Prazosin. Keep follow-up with Dr. Wolfgang Phoenix.   Diabetic autonomic neuropathy Patient continues on insulin and oral hypoglycemic agents. Recent hemoglobin A1c 7.5. Keep follow-up with Dr. Wolfgang Phoenix.     Satira Sark, M.D., F.A.C.C.

## 2014-01-18 NOTE — Assessment & Plan Note (Signed)
Very atypical in description for ischemia. ECG is nonspecific showing LVH. He does have a number of cardiac risk factors as detailed above at baseline. No ischemic testing at least within the last 5 years. Plan will be to obtain a [place] for further risk stratification. Unless this shows substantial abnormalities, would likely continue medical therapy and risk factor modification strategies. We will call him with the results.

## 2014-01-23 ENCOUNTER — Encounter (HOSPITAL_COMMUNITY)
Admission: RE | Admit: 2014-01-23 | Discharge: 2014-01-23 | Disposition: A | Discharge status: Home / Self Care | Payer: Medicare Other | Source: Ambulatory Visit | Attending: Cardiology | Admitting: Cardiology

## 2014-01-23 ENCOUNTER — Encounter (HOSPITAL_COMMUNITY): Payer: Self-pay

## 2014-01-23 ENCOUNTER — Ambulatory Visit (HOSPITAL_COMMUNITY)
Admission: RE | Admit: 2014-01-23 | Discharge: 2014-01-23 | Disposition: A | Discharge status: Home / Self Care | Payer: Medicare Other | Source: Ambulatory Visit | Attending: Cardiology | Admitting: Cardiology

## 2014-01-23 DIAGNOSIS — E119 Type 2 diabetes mellitus without complications: Secondary | ICD-10-CM | POA: Insufficient documentation

## 2014-01-23 DIAGNOSIS — I10 Essential (primary) hypertension: Secondary | ICD-10-CM | POA: Diagnosis not present

## 2014-01-23 DIAGNOSIS — R079 Chest pain, unspecified: Secondary | ICD-10-CM | POA: Insufficient documentation

## 2014-01-23 DIAGNOSIS — E785 Hyperlipidemia, unspecified: Secondary | ICD-10-CM | POA: Insufficient documentation

## 2014-01-23 DIAGNOSIS — R072 Precordial pain: Secondary | ICD-10-CM

## 2014-01-23 HISTORY — DX: Disorder of kidney and ureter, unspecified: N28.9

## 2014-01-23 MED ORDER — SODIUM CHLORIDE 0.9 % IJ SOLN
INTRAMUSCULAR | Status: AC
  Administered 2014-01-23: 10 mL via INTRAVENOUS
  Filled 2014-01-23: qty 3

## 2014-01-23 MED ORDER — SODIUM CHLORIDE 0.9 % IJ SOLN
10.0000 mL | INTRAMUSCULAR | Status: DC | PRN
  Administered 2014-01-23: 10 mL via INTRAVENOUS
  Filled 2014-01-23: qty 10

## 2014-01-23 MED ORDER — TECHNETIUM TC 99M SESTAMIBI GENERIC - CARDIOLITE
30.0000 | Freq: Once | INTRAVENOUS | Status: AC | PRN
  Administered 2014-01-23: 30 via INTRAVENOUS

## 2014-01-23 MED ORDER — SODIUM CHLORIDE 0.9 % IJ SOLN
INTRAMUSCULAR | Status: AC
  Filled 2014-01-23: qty 36

## 2014-01-23 MED ORDER — REGADENOSON 0.4 MG/5ML IV SOLN
0.4000 mg | Freq: Once | INTRAVENOUS | Status: AC | PRN
  Administered 2014-01-23: 0.4 mg via INTRAVENOUS

## 2014-01-23 MED ORDER — REGADENOSON 0.4 MG/5ML IV SOLN
INTRAVENOUS | Status: AC
  Administered 2014-01-23: 0.4 mg via INTRAVENOUS
  Filled 2014-01-23: qty 5

## 2014-01-23 MED ORDER — TECHNETIUM TC 99M SESTAMIBI - CARDIOLITE
10.0000 | Freq: Once | INTRAVENOUS | Status: AC | PRN
  Administered 2014-01-23: 10 via INTRAVENOUS

## 2014-01-23 NOTE — Progress Notes (Signed)
Stress Lab Nurses Notes - [name]  Logan Hooper 01/23/2014 Reason for doing test: Chest Pain Type of test: Wille Glaser Nurse performing test: Gerrit Halls, RN Nuclear Medicine Tech: Melburn Hake Echo Tech: Not Applicable MD performing test: S. McDowell/K. Purcell Nails NP Family MD: Mickie Hillier Test explained and consent signed: Yes.   IV started: Saline lock flushed, No redness or edema and Saline lock started in radiology Symptoms: none Treatment/Intervention: None Reason test stopped: protocol completed After recovery IV was: Discontinued via X-ray tech and No redness or edema Patient to return to Nuc. Med at : 10:15 Patient discharged: Home Patient's Condition upon discharge was: stable Comments: During test BP 169/69 & HR 82.  Recovery BP 171/75 & HR 65 .  Symptoms resolved in recovery. [name] T

## 2014-02-07 DIAGNOSIS — M47816 Spondylosis without myelopathy or radiculopathy, lumbar region: Secondary | ICD-10-CM | POA: Diagnosis not present

## 2014-02-07 DIAGNOSIS — M488X6 Other specified spondylopathies, lumbar region: Secondary | ICD-10-CM | POA: Diagnosis not present

## 2014-02-27 DIAGNOSIS — E119 Type 2 diabetes mellitus without complications: Secondary | ICD-10-CM | POA: Diagnosis not present

## 2014-02-27 DIAGNOSIS — G8929 Other chronic pain: Secondary | ICD-10-CM | POA: Diagnosis not present

## 2014-02-27 DIAGNOSIS — Z833 Family history of diabetes mellitus: Secondary | ICD-10-CM | POA: Diagnosis not present

## 2014-02-27 DIAGNOSIS — Z79899 Other long term (current) drug therapy: Secondary | ICD-10-CM | POA: Diagnosis not present

## 2014-02-27 DIAGNOSIS — Z8249 Family history of ischemic heart disease and other diseases of the circulatory system: Secondary | ICD-10-CM | POA: Diagnosis not present

## 2014-02-27 DIAGNOSIS — Z87442 Personal history of urinary calculi: Secondary | ICD-10-CM | POA: Diagnosis not present

## 2014-02-27 DIAGNOSIS — M47816 Spondylosis without myelopathy or radiculopathy, lumbar region: Secondary | ICD-10-CM | POA: Diagnosis not present

## 2014-02-27 DIAGNOSIS — M5136 Other intervertebral disc degeneration, lumbar region: Secondary | ICD-10-CM | POA: Diagnosis not present

## 2014-02-27 DIAGNOSIS — Z7982 Long term (current) use of aspirin: Secondary | ICD-10-CM | POA: Diagnosis not present

## 2014-02-27 DIAGNOSIS — I10 Essential (primary) hypertension: Secondary | ICD-10-CM | POA: Diagnosis not present

## 2014-02-27 DIAGNOSIS — E780 Pure hypercholesterolemia: Secondary | ICD-10-CM | POA: Diagnosis not present

## 2014-02-27 DIAGNOSIS — Z794 Long term (current) use of insulin: Secondary | ICD-10-CM | POA: Diagnosis not present

## 2014-02-27 DIAGNOSIS — J439 Emphysema, unspecified: Secondary | ICD-10-CM | POA: Diagnosis not present

## 2014-02-27 DIAGNOSIS — Z8489 Family history of other specified conditions: Secondary | ICD-10-CM | POA: Diagnosis not present

## 2014-03-22 LAB — HM DIABETES EYE EXAM

## 2014-03-28 DIAGNOSIS — M545 Low back pain: Secondary | ICD-10-CM | POA: Diagnosis not present

## 2014-03-28 DIAGNOSIS — M5136 Other intervertebral disc degeneration, lumbar region: Secondary | ICD-10-CM | POA: Diagnosis not present

## 2014-03-28 DIAGNOSIS — M47816 Spondylosis without myelopathy or radiculopathy, lumbar region: Secondary | ICD-10-CM | POA: Diagnosis not present

## 2014-06-22 ENCOUNTER — Encounter: Payer: Self-pay | Admitting: Family Medicine

## 2014-06-22 ENCOUNTER — Ambulatory Visit (INDEPENDENT_AMBULATORY_CARE_PROVIDER_SITE_OTHER): Payer: Medicare Other | Admitting: Family Medicine

## 2014-06-22 VITALS — BP 128/68 | Temp 98.6°F | Ht 71.0 in | Wt 255.0 lb

## 2014-06-22 DIAGNOSIS — G4700 Insomnia, unspecified: Secondary | ICD-10-CM | POA: Diagnosis not present

## 2014-06-22 DIAGNOSIS — E785 Hyperlipidemia, unspecified: Secondary | ICD-10-CM

## 2014-06-22 DIAGNOSIS — M25511 Pain in right shoulder: Secondary | ICD-10-CM | POA: Diagnosis not present

## 2014-06-22 DIAGNOSIS — R195 Other fecal abnormalities: Secondary | ICD-10-CM

## 2014-06-22 DIAGNOSIS — Z79899 Other long term (current) drug therapy: Secondary | ICD-10-CM | POA: Diagnosis not present

## 2014-06-22 DIAGNOSIS — I10 Essential (primary) hypertension: Secondary | ICD-10-CM | POA: Diagnosis not present

## 2014-06-22 DIAGNOSIS — Z125 Encounter for screening for malignant neoplasm of prostate: Secondary | ICD-10-CM | POA: Diagnosis not present

## 2014-06-22 DIAGNOSIS — E119 Type 2 diabetes mellitus without complications: Secondary | ICD-10-CM | POA: Diagnosis not present

## 2014-06-22 DIAGNOSIS — R159 Full incontinence of feces: Secondary | ICD-10-CM | POA: Diagnosis not present

## 2014-06-22 LAB — POCT GLYCOSYLATED HEMOGLOBIN (HGB A1C): Hemoglobin A1C: 7.3

## 2014-06-22 NOTE — Progress Notes (Signed)
   Subjective:    Patient ID: Logan Hooper, male    DOB: 09-Nov-1945, 69 y.o.   MRN: [id number] Patient arrives office with numerous concerns.  Notes swelling in his legs right leg more than left.  Ongoing challenges with knee pain. Joint pain. Worse with certain motions. Told in the past she was facing and joint replacement one point. Next  Compliant blood pressure medicine. No obvious side effects. Watching salt intake. Not exercising. Compliant with lipid medicine. No obvious side effects. Has cut down fats in diet.  No low sugar spells.  Diabetes He presents for his follow-up diabetic visit. He has type 2 diabetes mellitus. He is compliant with treatment all of the time. He is following a generally healthy diet. Exercise: walks some every day. He does not see a podiatrist.Eye exam is current.   Sugars running pretty decdnt  Results for orders placed or performed in visit on 06/22/14  POCT glycosylated hemoglobin (Hb A1C)  Result Value Ref Range   Hemoglobin A1C 7.3   HM DIABETES EYE EXAM  Result Value Ref Range   HM Diabetic Eye Exam No Retinopathy No Retinopathy    pt states for the past few months he has had stool leaking out. Pt states he is overdue for his colonoscopy.   Last colonoscopy "good long while" ago, told at the [place] it is due, unable to go when sched by the [place]   [phone]   Left ankle swelling. Just noticied this am.     Review of Systems No headache no chest pain no back pain no abdominal pain no rash    Objective:   Physical Exam  Alert vitals stable blood pressure good on repeat HEENT normal lungs clear heart rare rhythm ankles 1+ edema left foot trace edema some crepitations right knee abdomen soft no distinct tenderness no CVA tenderness rectal exam somewhat loose sphincter prostate normal      Assessment & Plan:  Impression 1 hypertension good control #2 type 2 diabetes good control not perfect but good discussed #3 hyperlipidemia status  uncertain #4 swelling likely venous stasis discussed #5 stool changes. Discussed. #6 possible mild rectal incontinence component plan diet exercise discussed. Maintain same medications. Medicines refilled. GI referral. 35-40 minutes spent most in discussion WSL

## 2014-06-23 LAB — LIPID PANEL
Chol/HDL Ratio: 3.6 ratio units (ref 0.0–5.0)
Cholesterol, Total: 137 mg/dL (ref 100–199)
HDL: 38 mg/dL — ABNORMAL LOW (ref >39)
LDL Calculated: 74 mg/dL (ref 0–99)
TRIGLYCERIDES: 127 mg/dL (ref 0–149)
VLDL Cholesterol Cal: 25 mg/dL (ref 5–40)

## 2014-06-23 LAB — HEPATIC FUNCTION PANEL
ALT: 18 IU/L (ref 0–44)
AST: 24 IU/L (ref 0–40)
Albumin: 4.3 g/dL (ref 3.6–4.8)
Alkaline Phosphatase: 61 IU/L (ref 39–117)
BILIRUBIN, DIRECT: 0.17 mg/dL (ref 0.00–0.40)
Bilirubin Total: 0.5 mg/dL (ref 0.0–1.2)
Total Protein: 6.8 g/dL (ref 6.0–8.5)

## 2014-06-23 LAB — BASIC METABOLIC PANEL
BUN/Creatinine Ratio: 20 (ref 10–22)
BUN: 25 mg/dL (ref 8–27)
CALCIUM: 9.4 mg/dL (ref 8.6–10.2)
CO2: 22 mmol/L (ref 18–29)
Chloride: 104 mmol/L (ref 97–108)
Creatinine, Ser: 1.25 mg/dL (ref 0.76–1.27)
GFR calc Af Amer: 68 mL/min/{1.73_m2} (ref >59)
GFR calc non Af Amer: 59 mL/min/{1.73_m2} — ABNORMAL LOW (ref >59)
Glucose: 111 mg/dL — ABNORMAL HIGH (ref 65–99)
POTASSIUM: 4.7 mmol/L (ref 3.5–5.2)
Sodium: 139 mmol/L (ref 134–144)

## 2014-06-23 LAB — MICROALBUMIN, URINE: MICROALBUM., U, RANDOM: 250.8 ug/mL

## 2014-06-23 LAB — PSA: Prostate Specific Ag, Serum: 2.5 ng/mL (ref 0.0–4.0)

## 2014-06-24 ENCOUNTER — Encounter: Payer: Self-pay | Admitting: Family Medicine

## 2014-06-27 ENCOUNTER — Encounter: Payer: Self-pay | Admitting: Internal Medicine

## 2014-07-03 DIAGNOSIS — M47816 Spondylosis without myelopathy or radiculopathy, lumbar region: Secondary | ICD-10-CM | POA: Diagnosis not present

## 2014-07-17 ENCOUNTER — Encounter: Payer: Self-pay | Admitting: Internal Medicine

## 2014-07-17 ENCOUNTER — Ambulatory Visit (INDEPENDENT_AMBULATORY_CARE_PROVIDER_SITE_OTHER): Payer: Medicare Other | Admitting: Internal Medicine

## 2014-07-17 VITALS — BP 132/78 | HR 70 | Ht 72.0 in | Wt 262.4 lb

## 2014-07-17 DIAGNOSIS — R159 Full incontinence of feces: Secondary | ICD-10-CM

## 2014-07-17 MED ORDER — DICYCLOMINE HCL 10 MG PO CAPS: 10.0000 mg | ORAL_CAPSULE | Freq: Two times a day (BID) | ORAL | Status: DC

## 2014-07-17 MED ORDER — HYDROCORTISONE ACETATE 25 MG RE SUPP: 25.0000 mg | Freq: Every day | RECTAL | Status: DC

## 2014-07-17 NOTE — Progress Notes (Signed)
Logan Hooper August 02, 1945 [id number]  Note: This dictation was prepared with Dragon digital system. Any transcriptional errors that result from this procedure are unintentional.   History of Present Illness: This is a 69 year old white male patient of Dr. Wolfgang Phoenix, who presents with  rectal incontinence and loose stools. He was formerly followed at [facility] where he had his last colonoscopy in 2011. We don't have the report but he was told there were no polyps. Prior colonoscopy about 5 years before that showed 1 polyp. He is a diabetic, with high blood pressure,  obesity , on metformin thousand milligrams twice a day. He has fecal urgency and soft oor loose stools resulting in small amount of leakage on his underpants almost daily. He denies any rectal bleeding but has occasional lower crampy abdominal pain. There is no family history of colon cancer. His diet insists of high carbohydrate such as breakfast from [employer] or [place]\,  He has gained a lot of weight.    Past Medical History  Diagnosis Date  . Essential hypertension   . Hyperlipidemia   . Nephrolithiasis   . Type 2 diabetes mellitus with peripheral neuropathy   . Arthritis   . ED (erectile dysfunction)   . Sciatica   . PTSD (post-traumatic stress disorder)   . Renal insufficiency     Past Surgical History  Procedure Laterality Date  . Back surgery    . Video assisted thoracoscopy (vats)/decortication Left 2009    Dr. Arlyce Dice  -left-sided empyema    No Known Allergies  Family history and social history have been reviewed.  Review of Systems: Occasional crampy lower abdominal pain. Denies rectal bleeding. Denies back pain history of back surgery  The remainder of the 10 point ROS is negative except as outlined in the H&P  Physical Exam: General Appearance Well developed, in no distress Eyes  Non icteric  HEENT  Non traumatic, normocephalic  Mouth No lesion, tongue papillated, no cheilosis Neck  Supple without adenopathy, thyroid not enlarged, no carotid bruits, no JVD Lungs Clear to auscultation bilaterally COR Normal S1, normal S2, regular rhythm, no murmur, quiet precordium Abdomen large protuberant soft abdomen with normoactive bowel sounds. No fluid wave. Liver edge at costal margin minimal tenderness in the left lower quadrant and left middle quadrant Rectal and anoscopic exam reveals normal perianal area with the normal rectal sphincter tone. He had normal squeeze. There was moderate amount of  Soft, yellow Hemoccult-negative stool. There were small internal hemorrhoids Extremities  No pedal edema Skin No lesions Neurological Alert and oriented x 3 Psychological Normal mood and affect  Assessment and Plan:   69 year old white male with small amount of  stool leakage on daily basis, it appears that he does not empty the rectal ampulla after a bowel movement which may be a function of low fiber diet as well as  diabetic neuropathy. He has  symptomatic internal hemorrhoids. We have discussed high fiber diet and I'll ask him to start on Benefiber 1 heaping teaspoon daily. Metformin 1 gm bid  can cause diarrhea and loose stools so we will start Bentyl 10 mg twice a day as an anti-spasmodic. Marland Kitchen He will use hydrocortisone suppository 25 mg at night for next 10 days to decrease any inflammation which may be related to  rectal irritation. He will eventually be ready for colonoscopy but I would like to see him in about 8 weeks to reassess the results of this regimen Hx of colon polyp about 10 years ago,  no polyp in 2011 at the [facility].   Delfin Edis 07/17/2014

## 2014-07-17 NOTE — Patient Instructions (Addendum)
We have sent medications to your pharmacy for you to pick up at your convenience.  Take OTC Benefiber. 1TBSP daily.  Dr Mickie Hillier

## 2014-07-31 ENCOUNTER — Encounter: Payer: Self-pay | Admitting: Gastroenterology

## 2014-09-18 ENCOUNTER — Ambulatory Visit: Payer: Medicare Other | Admitting: Internal Medicine

## 2014-09-24 ENCOUNTER — Ambulatory Visit (INDEPENDENT_AMBULATORY_CARE_PROVIDER_SITE_OTHER): Payer: Medicare Other | Admitting: Gastroenterology

## 2014-09-24 ENCOUNTER — Encounter: Payer: Self-pay | Admitting: Gastroenterology

## 2014-09-24 VITALS — BP 148/74 | HR 64 | Ht 70.25 in | Wt 259.2 lb

## 2014-09-24 DIAGNOSIS — R159 Full incontinence of feces: Secondary | ICD-10-CM

## 2014-09-24 NOTE — Patient Instructions (Signed)

## 2014-09-24 NOTE — Progress Notes (Signed)
HPI :  69 y/o male here for follow up for fecal incontinence. He was seen by Dr. Olevia Perches for this in July. He reports he has had some fecal incontinence ongoing for about a year. He denies diarrhea at baseline. He normally has 1 BM per day. He has some mild constipatoin but usually normal form of stools. He has incontinence of stool for the most part every day. He denies urge to have a bowel movement with this, he just notices stool in his underwear after he passes gas or just notices incidentally when he uses the bathroom or changes his clothes. NO blood in the stools. He had a colonoscopy done about 5 years ago done at [place] in [facility], he thinks this was okay but we don't have the report.  He thinks it is getting worse and occuring more frequently. He tried taking a hydrocortizone suppository which has not helped at all since the last visit. He tried taking a fiber supplement which did not help.  No abdominal pains. No weight loss, trying to lose weight. He does endorse longstanding back pain with 2 prior back surgeries. He also endorses some DM related neuropathy in his feet. He endorses normal sensation in the perianal area and can sense the urge to have a bowel movement.   Past Medical History  Diagnosis Date  . Essential hypertension   . Hyperlipidemia   . Nephrolithiasis   . Type 2 diabetes mellitus with peripheral neuropathy   . Arthritis   . ED (erectile dysfunction)   . Sciatica   . PTSD (post-traumatic stress disorder)   . Renal insufficiency      Past Surgical History  Procedure Laterality Date  . Back surgery    . Video assisted thoracoscopy (vats)/decortication Left 2009    Dr. Arlyce Dice  -left-sided empyema   Family History  Problem Relation Age of Onset  . Heart disease Mother     Reportedly diagnosed in her 5s  . Dementia Father    Social History  Substance Use Topics  . Smoking status: Former Smoker    Types: Cigarettes, Cigars    Start date: 01/05/1962   Quit date: 01/06/2007  . Smokeless tobacco: Never Used  . Alcohol Use: No   Current Outpatient Prescriptions  Medication Sig Dispense Refill  . amLODipine (NORVASC) 10 MG tablet Take 10 mg by mouth daily.    Marland Kitchen aspirin 81 MG tablet Take 81 mg by mouth daily.    . diclofenac (VOLTAREN) 75 MG EC tablet Take 1 tablet (75 mg total) by mouth 2 (two) times daily. 28 tablet 1  . fish oil-omega-3 fatty acids 1000 MG capsule Take 1,000 mg by mouth 2 (two) times daily.     Marland Kitchen HYDROcodone-acetaminophen (NORCO) 10-325 MG per tablet Take 1 tablet by mouth every 12 (twelve) hours.    . hydrocortisone (ANUSOL-HC) 25 MG suppository Place 1 suppository (25 mg total) rectally at bedtime. 10 suppository 0  . insulin aspart (NOVOLOG) 100 UNIT/ML injection Sliding scale with meals    . insulin glargine (LANTUS) 100 UNIT/ML injection Inject 25 Units into the skin at bedtime.     Marland Kitchen losartan (COZAAR) 100 MG tablet Take 100 mg by mouth daily.    . metFORMIN (GLUCOPHAGE) 1000 MG tablet Take 1,000 mg by mouth 2 (two) times daily with a meal.    . Multiple Vitamins-Minerals (MULTIVITAMINS THER. W/MINERALS) TABS Take 1 tablet by mouth daily.    . pioglitazone (ACTOS) 15 MG tablet Take 15 mg  by mouth daily.    . prazosin (MINIPRESS) 1 MG capsule Take 1 mg by mouth. Take 2 tablets qhs    . simvastatin (ZOCOR) 80 MG tablet Take 80 mg by mouth. Take 1/2 tablet qhs    . traZODone (DESYREL) 150 MG tablet Take 150 mg by mouth. Take one third to one half by mouth qhs and may repeat one time     No current facility-administered medications for this visit.   No Known Allergies   Review of Systems: All systems reviewed and negative except where noted in HPI.    No results found.  Recent labs in Epic reviewed and found to be normal.   Physical Exam: BP 148/74 mmHg  Pulse 64  Ht 5' 10.25" (1.784 m)  Wt 259 lb 4 oz (117.595 kg)  BMI 36.95 kg/m2 Constitutional: Pleasant,well-developed, male in no acute distress. HEENT:  Normocephalic and atraumatic. Conjunctivae are normal. No scleral icterus. Neck supple.  Cardiovascular: Normal rate, regular rhythm.  Pulmonary/chest: Effort normal and breath sounds normal. No wheezing, rales or rhonchi. Abdominal: Soft, nondistended, nontender. Bowel sounds active throughout. There are no masses palpable. No hepatomegaly. Rectal - patient declined today given he had a rectal exam by Dr. Olevia Perches last month which was reported in her note as normal with normal squeeze pressure and resting pressure.  Extremities: no edema Lymphadenopathy: No cervical adenopathy noted. Neurological: Alert and oriented to person place and time. Skin: Skin is warm and dry. No rashes noted. Psychiatric: Normal mood and affect. Behavior is normal.   ASSESSMENT AND PLAN: 69 y./o male presenting with fecal incontinence for the past year or so. He denies any diarrhea or urge incontinence. He can't tell when he passes stool at times and appears to have difficulty distinguishing from gas / stool. Perhaps he has some neuropathic changes from DM or his prior back surgeries, he also has ED. He has had no improvement with empiric trial of fiber supplementation and hydrocortizone suppositories. Given his last colonoscopy was over 5 years ago, recommend he have another colonoscopy to ensure no structural changes such as polyp / mass lesion and no inflammatory process, although think less likely. His rectal exam was normal per Dr. Olevia Perches but if colonoscopy is negative will get him set up for anorectal manometry to more formally assess for sphincter pressures and assess his sensation and evaluate a neuropathic process. Following discussion of the risks / benefits of colonoscopy he wishes to proceed with the exam. Further recommendations pending the results. All questions answered.   Winslow West Cellar, MD [facility] Gastroenterology Pager [phone]

## 2014-09-27 ENCOUNTER — Other Ambulatory Visit: Payer: Self-pay | Admitting: Gastroenterology

## 2014-09-27 DIAGNOSIS — R159 Full incontinence of feces: Secondary | ICD-10-CM

## 2014-10-17 ENCOUNTER — Other Ambulatory Visit: Payer: Self-pay

## 2014-10-17 ENCOUNTER — Telehealth: Payer: Self-pay | Admitting: Gastroenterology

## 2014-10-17 MED ORDER — NA SULFATE-K SULFATE-MG SULF 17.5-3.13-1.6 GM/177ML PO SOLN
ORAL | Status: DC
Start: 2014-10-17 — End: 2014-10-23

## 2014-10-17 NOTE — Telephone Encounter (Signed)
Pt called and i informed him that the prep was sent to his pharmacy. Pt understands

## 2014-10-17 NOTE — Telephone Encounter (Signed)
Left pt vm to call back. Prep was resent to pharmacy.

## 2014-10-23 ENCOUNTER — Encounter: Payer: Self-pay | Admitting: Gastroenterology

## 2014-10-23 ENCOUNTER — Ambulatory Visit (AMBULATORY_SURGERY_CENTER): Payer: Medicare Other | Admitting: Gastroenterology

## 2014-10-23 VITALS — BP 152/75 | HR 58 | Temp 98.1°F | Resp 12 | Ht 70.25 in | Wt 259.0 lb

## 2014-10-23 DIAGNOSIS — F329 Major depressive disorder, single episode, unspecified: Secondary | ICD-10-CM | POA: Diagnosis not present

## 2014-10-23 DIAGNOSIS — E119 Type 2 diabetes mellitus without complications: Secondary | ICD-10-CM | POA: Diagnosis not present

## 2014-10-23 DIAGNOSIS — R159 Full incontinence of feces: Secondary | ICD-10-CM | POA: Diagnosis not present

## 2014-10-23 DIAGNOSIS — I10 Essential (primary) hypertension: Secondary | ICD-10-CM | POA: Diagnosis not present

## 2014-10-23 DIAGNOSIS — F4310 Post-traumatic stress disorder, unspecified: Secondary | ICD-10-CM | POA: Diagnosis not present

## 2014-10-23 LAB — GLUCOSE, CAPILLARY
Glucose-Capillary: 144 mg/dL — ABNORMAL HIGH (ref 65–99)
Glucose-Capillary: 98 mg/dL (ref 65–99)

## 2014-10-23 MED ORDER — SODIUM CHLORIDE 0.9 % IV SOLN: 500.0000 mL | INTRAVENOUS | Status: DC

## 2014-10-23 NOTE — Op Note (Signed)
[place]  [address] [place], [address]   COLONOSCOPY PROCEDURE REPORT  PATIENT: Logan Hooper, Logan Hooper  MR#: [id number] BIRTHDATE: [birth year] , 69  yrs. old GENDER: male ENDOSCOPIST: Yetta Flock, MD REFERRED BY: PROCEDURE DATE:  10/23/2014 PROCEDURE:   Colonoscopy, diagnostic and Colonoscopy with biopsy First Screening Colonoscopy - Avg.  risk and is 50 yrs.  old or older - No.  Prior Negative Screening - Now for repeat screening. Other: See Comments  History of Adenoma - Now for follow-up colonoscopy & has been > or = to 3 yrs.  N/A  Polyps removed today? No Recommend repeat exam, <10 yrs? No ASA CLASS:   Class III INDICATIONS:change in bowel habits, fecal incontinence, and Colorectal Neoplasm Risk Assessment for this procedure is average risk. MEDICATIONS: Propofol 300 mg IV and Lidocaine 200 mg IV  DESCRIPTION OF PROCEDURE:   After the risks benefits and alternatives of the procedure were thoroughly explained, informed consent was obtained.  The digital rectal exam revealed no abnormalities of the rectum.   The [id number]  endoscope was introduced through the anus and advanced to the terminal ileum which was intubated for a short distance. No adverse events experienced.   The quality of the prep was adequate  The instrument was then slowly withdrawn as the colon was fully examined. Estimated blood loss is zero unless otherwise noted in this procedure report.  COLON FINDINGS: The colon appeared normal without any overt inflammatory changes.  Mild diverticulosis was noted in the left colon.  No polyps or mass lesions were noted.  Random biopsies were taken of the right and left colon to rule out microscopic colitis. Limited views of the ileum were normal.  No evidence of proctitis. Retroflexed views revealed internal hemorrhoids. The bowel prep was only fair in the right colon. Time was spent to lavage the colon with adequate views obtained.  The time to cecum = 2.1 Withdrawal time = 20.6   The scope was withdrawn and the procedure completed. COMPLICATIONS: There were no immediate complications.  ENDOSCOPIC IMPRESSION: Mild left sided diverticulosis Normal examined colon otherwise - no inflammatory changes. Biopsies taken to rule out microscopic colitis.    RECOMMENDATIONS: Await pathology results Resume diet Resume medications Consider anorectal manometry if biopsies are negative to further evaluate symptoms  eSigned:  Yetta Flock, MD 10/23/2014 4:10 PM   cc: the patient   PATIENT NAME:  Logan Hooper, Logan Hooper MR#: [id number]

## 2014-10-23 NOTE — Progress Notes (Signed)
Report to PACU, RN, vss, BBS= Clear.  

## 2014-10-23 NOTE — Progress Notes (Signed)
Called to room to assist during endoscopic procedure.  Patient ID and intended procedure confirmed with present staff. Received instructions for my participation in the procedure from the performing physician.  

## 2014-10-23 NOTE — Patient Instructions (Signed)
YOU HAD AN ENDOSCOPIC PROCEDURE TODAY AT THE [facility]:   Refer to the procedure report that was given to you for any specific questions about what was found during the examination.  If the procedure report does not answer your questions, please call your gastroenterologist to clarify.  If you requested that your care partner not be given the details of your procedure findings, then the procedure report has been included in a sealed envelope for you to review at your convenience later.  YOU SHOULD EXPECT: Some feelings of bloating in the abdomen. Passage of more gas than usual.  Walking can help get rid of the air that was put into your GI tract during the procedure and reduce the bloating. If you had a lower endoscopy (such as a colonoscopy or flexible sigmoidoscopy) you may notice spotting of blood in your stool or on the toilet paper. If you underwent a bowel prep for your procedure, you may not have a normal bowel movement for a few days.  Please Note:  You might notice some irritation and congestion in your nose or some drainage.  This is from the oxygen used during your procedure.  There is no need for concern and it should clear up in a day or so.  SYMPTOMS TO REPORT IMMEDIATELY:   Following lower endoscopy (colonoscopy or flexible sigmoidoscopy):  Excessive amounts of blood in the stool  Significant tenderness or worsening of abdominal pains  Swelling of the abdomen that is new, acute  Fever of 100F or higher  For urgent or emergent issues, a gastroenterologist can be reached at any hour by calling [phone].   DIET: Your first meal following the procedure should be a small meal and then it is ok to progress to your normal diet. Heavy or fried foods are harder to digest and may make you feel nauseous or bloated.  Likewise, meals heavy in dairy and vegetables can increase bloating.  Drink plenty of fluids but you should avoid alcoholic beverages for 24  hours.  ACTIVITY:  You should plan to take it easy for the rest of today and you should NOT DRIVE or use heavy machinery until tomorrow (because of the sedation medicines used during the test).    FOLLOW UP: Our staff will call the number listed on your records the next business day following your procedure to check on you and address any questions or concerns that you may have regarding the information given to you following your procedure. If we do not reach you, we will leave a message.  However, if you are feeling well and you are not experiencing any problems, there is no need to return our call.  We will assume that you have returned to your regular daily activities without incident.  If any biopsies were taken you will be contacted by phone or by letter within the next 1-3 weeks.  Please call us at [phone] if you have not heard about the biopsies in 3 weeks.    SIGNATURES/CONFIDENTIALITY: You and/or your care partner have signed paperwork which will be entered into your electronic medical record.  These signatures attest to the fact that that the information above on your After Visit Summary has been reviewed and is understood.  Full responsibility of the confidentiality of this discharge information lies with you and/or your care-partner.  Diverticulosis information given. 

## 2014-10-24 ENCOUNTER — Telehealth: Payer: Self-pay | Admitting: *Deleted

## 2014-10-24 NOTE — Telephone Encounter (Signed)
  Follow up Call-  Call back number 10/23/2014  Post procedure Call Back phone  # [phone]  Permission to leave phone message Yes     No answer at # given.  No voicemail setup to leave message.

## 2014-12-12 DIAGNOSIS — M1711 Unilateral primary osteoarthritis, right knee: Secondary | ICD-10-CM | POA: Diagnosis not present

## 2014-12-14 ENCOUNTER — Telehealth: Payer: Self-pay | Admitting: Family Medicine

## 2014-12-14 NOTE — Telephone Encounter (Signed)
Sorry, early next wk

## 2014-12-14 NOTE — Telephone Encounter (Signed)
Pt needs surgical clearance for knee replacement by the 21st   When would you like to add him to your schedule

## 2014-12-14 NOTE — Telephone Encounter (Signed)
Appt made Tuesday

## 2014-12-14 NOTE — Telephone Encounter (Signed)
22nd

## 2014-12-18 ENCOUNTER — Ambulatory Visit (INDEPENDENT_AMBULATORY_CARE_PROVIDER_SITE_OTHER): Payer: Medicare Other | Admitting: Family Medicine

## 2014-12-18 ENCOUNTER — Encounter: Payer: Self-pay | Admitting: Family Medicine

## 2014-12-18 VITALS — BP 136/84 | Ht 71.0 in | Wt 261.2 lb

## 2014-12-18 DIAGNOSIS — E119 Type 2 diabetes mellitus without complications: Secondary | ICD-10-CM | POA: Diagnosis not present

## 2014-12-18 DIAGNOSIS — E785 Hyperlipidemia, unspecified: Secondary | ICD-10-CM | POA: Diagnosis not present

## 2014-12-18 DIAGNOSIS — I10 Essential (primary) hypertension: Secondary | ICD-10-CM | POA: Diagnosis not present

## 2014-12-18 DIAGNOSIS — Z794 Long term (current) use of insulin: Secondary | ICD-10-CM

## 2014-12-18 LAB — POCT GLYCOSYLATED HEMOGLOBIN (HGB A1C): Hemoglobin A1C: 6.8

## 2014-12-18 MED ORDER — AMLODIPINE BESYLATE 10 MG PO TABS: 10.0000 mg | ORAL_TABLET | Freq: Every day | ORAL | Status: AC

## 2014-12-18 MED ORDER — PIOGLITAZONE HCL 15 MG PO TABS: 15.0000 mg | ORAL_TABLET | Freq: Every day | ORAL | Status: DC

## 2014-12-18 MED ORDER — INSULIN ASPART 100 UNIT/ML ~~LOC~~ SOLN: SUBCUTANEOUS | Status: DC

## 2014-12-18 MED ORDER — LOSARTAN POTASSIUM 100 MG PO TABS: 100.0000 mg | ORAL_TABLET | Freq: Every day | ORAL | Status: DC

## 2014-12-18 MED ORDER — METFORMIN HCL 1000 MG PO TABS: 1000.0000 mg | ORAL_TABLET | Freq: Two times a day (BID) | ORAL | Status: AC

## 2014-12-18 MED ORDER — INSULIN GLARGINE 100 UNIT/ML ~~LOC~~ SOLN: 25.0000 [IU] | Freq: Every day | SUBCUTANEOUS | Status: AC

## 2014-12-18 NOTE — Progress Notes (Signed)
   Subjective:    Patient ID: Logan Hooper, male    DOB: 27-Sep-1945, 69 y.o.   MRN: [id number] Patient arrives for follow-up with multiple health concerns   HPI Patient arrives to discuss surgical clearance for a total knee replacement. Results for orders placed or performed in visit on 12/18/14  POCT glycosylated hemoglobin (Hb A1C)  Result Value Ref Range   Hemoglobin A1C 6.8    Sugars have been running good, generally does not miss any dosage for his diabetes. Minimal low sugar spells less he skipped a meal.  Compliant with lipid medication. Prior blood work results reviewed with patient today. Medication dosage in reviewed. Patient does not miss a dose. Claims compliance.  Maintaining hypertension medicine same dose. Does not miss any medications. Blood pressure good when checked elsewhere. Blood pressure medicine reviewed with patient today.    Pt fell with painful knee, giving away intermittently, facing knee rep surg right knee orthopedist states he needs replacement   Review of Systems No headache no chest pain no abdominal pain no shortness of breath no change in bowel habits complete ROS otherwise negative    Objective:   Physical Exam  Alert vital stable blood pressure good on repeat HEENT normal lungs clear. Heart regular in rhythm ankles without edema left knee mild crepitations and left knees at substantial crepitations and effusion      Assessment & Plan:  Impression 1 type 2 diabetes meds reviewed continue same #2 hypertension medications reviewed continue same #3 progressive arthritis discuss good candidate for surgery #4 hyperlipidemia discussed maintain same meds pending blood work results. Plan as noted above. Plus blood work. Plus fill out form for surgery follow-up as scheduled for wellness exam primarily WSL

## 2014-12-21 DIAGNOSIS — E785 Hyperlipidemia, unspecified: Secondary | ICD-10-CM | POA: Diagnosis not present

## 2014-12-21 DIAGNOSIS — E119 Type 2 diabetes mellitus without complications: Secondary | ICD-10-CM | POA: Diagnosis not present

## 2014-12-22 LAB — LIPID PANEL
CHOL/HDL RATIO: 3.1 ratio (ref 0.0–5.0)
CHOLESTEROL TOTAL: 123 mg/dL (ref 100–199)
HDL: 40 mg/dL (ref >39)
LDL Calculated: 53 mg/dL (ref 0–99)
TRIGLYCERIDES: 150 mg/dL — AB (ref 0–149)
VLDL Cholesterol Cal: 30 mg/dL (ref 5–40)

## 2014-12-22 LAB — HEPATIC FUNCTION PANEL
ALBUMIN: 4.4 g/dL (ref 3.6–4.8)
ALT: 21 IU/L (ref 0–44)
AST: 25 IU/L (ref 0–40)
Alkaline Phosphatase: 65 IU/L (ref 39–117)
BILIRUBIN TOTAL: 0.3 mg/dL (ref 0.0–1.2)
BILIRUBIN, DIRECT: 0.14 mg/dL (ref 0.00–0.40)
Total Protein: 6.8 g/dL (ref 6.0–8.5)

## 2014-12-23 ENCOUNTER — Encounter: Payer: Self-pay | Admitting: Family Medicine

## 2014-12-25 ENCOUNTER — Ambulatory Visit: Payer: Medicare Other | Admitting: Family Medicine

## 2014-12-27 ENCOUNTER — Encounter: Payer: Self-pay | Admitting: Family Medicine

## 2015-01-08 DIAGNOSIS — M488X6 Other specified spondylopathies, lumbar region: Secondary | ICD-10-CM | POA: Diagnosis not present

## 2015-01-08 DIAGNOSIS — M47816 Spondylosis without myelopathy or radiculopathy, lumbar region: Secondary | ICD-10-CM | POA: Diagnosis not present

## 2015-01-23 ENCOUNTER — Other Ambulatory Visit: Payer: Self-pay | Admitting: Orthopedic Surgery

## 2015-01-24 ENCOUNTER — Encounter: Payer: Self-pay | Admitting: Family Medicine

## 2015-01-24 ENCOUNTER — Ambulatory Visit (INDEPENDENT_AMBULATORY_CARE_PROVIDER_SITE_OTHER): Payer: Medicare Other | Admitting: Family Medicine

## 2015-01-24 VITALS — BP 138/74 | Ht 70.5 in | Wt 260.0 lb

## 2015-01-24 DIAGNOSIS — J209 Acute bronchitis, unspecified: Secondary | ICD-10-CM

## 2015-01-24 DIAGNOSIS — Z23 Encounter for immunization: Secondary | ICD-10-CM

## 2015-01-24 DIAGNOSIS — Z0000 Encounter for general adult medical examination without abnormal findings: Secondary | ICD-10-CM

## 2015-01-24 DIAGNOSIS — E785 Hyperlipidemia, unspecified: Secondary | ICD-10-CM

## 2015-01-24 MED ORDER — AMOXICILLIN 500 MG PO CAPS: ORAL_CAPSULE | ORAL | Status: DC

## 2015-01-24 NOTE — Progress Notes (Signed)
   Subjective:    Patient ID: Logan Hooper, male    DOB: 06-12-1945, 70 y.o.   MRN: [id number]  HPI AWV- Annual Wellness Visit  The patient was seen for their annual wellness visit. The patient's past medical history, surgical history, and family history were reviewed. Pertinent vaccines were reviewed ( tetanus, pneumonia, shingles, flu) The patient's medication list was reviewed and updated.  The height and weight were entered. The patient's current BMI is: 36.78  Cognitive screening was completed. Outcome of Mini - Cog: pass  Falls within the past 6 months: yes - fell bc knee gave out. Will have knee replacement on feb 21st. Passed get up and go test.   Current tobacco usage: none (All patients who use tobacco were given written and verbal information on quitting)  Recent listing of emergency department/hospitalizations over the past year were reviewed.  current specialist the patient sees on a regular basis: ortho for knee replacement. Dr. Mardelle Matte with [facility]   Medicare annual wellness visit patient questionnaire was reviewed.  A written screening schedule for the patient for the next 5-10 years was given. Appropriate discussion of followup regarding next visit was discussed.  A1C 6.8 last month.   Mostly watching diet, has cut dow for the supper time  Has cut down on eating extra stuff   patient has cough. Several weeks duration. Productive of phlegm. Spouse sick also. Due for surgery soon and does not want to stay sick   Review of Systems  Constitutional: Negative for fever, activity change and appetite change.  HENT: Negative for congestion and rhinorrhea.   Eyes: Negative for discharge.  Respiratory: Negative for cough and wheezing.   Cardiovascular: Negative for chest pain.  Gastrointestinal: Negative for vomiting, abdominal pain and blood in stool.  Genitourinary: Negative for frequency and difficulty urinating.  Musculoskeletal: Negative for neck  pain.  Skin: Negative for rash.  Allergic/Immunologic: Negative for environmental allergies and food allergies.  Neurological: Negative for weakness and headaches.  Psychiatric/Behavioral: Negative for agitation.  All other systems reviewed and are negative.      Objective:   Physical Exam  Constitutional: He appears well-developed and well-nourished.  Substantial obesity present  HENT:  Head: Normocephalic and atraumatic.  Right Ear: External ear normal.  Left Ear: External ear normal.  Nose: Nose normal.  Mouth/Throat: Oropharynx is clear and moist.  Eyes: EOM are normal. Pupils are equal, round, and reactive to light.  Neck: Normal range of motion. Neck supple. No thyromegaly present.  Cardiovascular: Normal rate, regular rhythm and normal heart sounds.   No murmur heard. Pulmonary/Chest: Effort normal and breath sounds normal. No respiratory distress. He has no wheezes.  Abdominal: Soft. Bowel sounds are normal. He exhibits no distension and no mass. There is no tenderness.  Genitourinary: Penis normal.  Musculoskeletal: Normal range of motion. He exhibits no edema.  bilat knee crep, right greater than left  Lymphadenopathy:    He has no cervical adenopathy.  Neurological: He is alert. He exhibits normal muscle tone.  Skin: Skin is warm and dry. No erythema.  Psychiatric: He has a normal mood and affect. His behavior is normal. Judgment normal.  Vitals reviewed.         Assessment & Plan:  Impression 1 well adult exam #2 subacute bronchitis discussed #3 type 2 diabetes good control discussed plan blood work reviewed. Pneumonia shot today. Medications refilled. Amoxicillin 3 times a day. Form filled out to authorize knee surgery. Follow-up in 6 months

## 2015-02-15 ENCOUNTER — Encounter (HOSPITAL_COMMUNITY)
Admission: RE | Admit: 2015-02-15 | Discharge: 2015-02-15 | Disposition: A | Discharge status: Home / Self Care | Payer: Medicare Other | Source: Ambulatory Visit | Attending: Orthopedic Surgery | Admitting: Orthopedic Surgery

## 2015-02-15 ENCOUNTER — Other Ambulatory Visit (HOSPITAL_COMMUNITY): Payer: Self-pay | Admitting: *Deleted

## 2015-02-15 ENCOUNTER — Encounter (HOSPITAL_COMMUNITY): Payer: Self-pay

## 2015-02-15 DIAGNOSIS — Z794 Long term (current) use of insulin: Secondary | ICD-10-CM | POA: Diagnosis not present

## 2015-02-15 DIAGNOSIS — Z01818 Encounter for other preprocedural examination: Secondary | ICD-10-CM | POA: Insufficient documentation

## 2015-02-15 DIAGNOSIS — M1711 Unilateral primary osteoarthritis, right knee: Secondary | ICD-10-CM | POA: Insufficient documentation

## 2015-02-15 DIAGNOSIS — N189 Chronic kidney disease, unspecified: Secondary | ICD-10-CM | POA: Insufficient documentation

## 2015-02-15 DIAGNOSIS — E785 Hyperlipidemia, unspecified: Secondary | ICD-10-CM | POA: Insufficient documentation

## 2015-02-15 DIAGNOSIS — Z01812 Encounter for preprocedural laboratory examination: Secondary | ICD-10-CM | POA: Diagnosis not present

## 2015-02-15 DIAGNOSIS — I129 Hypertensive chronic kidney disease with stage 1 through stage 4 chronic kidney disease, or unspecified chronic kidney disease: Secondary | ICD-10-CM | POA: Diagnosis not present

## 2015-02-15 DIAGNOSIS — E1122 Type 2 diabetes mellitus with diabetic chronic kidney disease: Secondary | ICD-10-CM | POA: Diagnosis not present

## 2015-02-15 DIAGNOSIS — Z7982 Long term (current) use of aspirin: Secondary | ICD-10-CM | POA: Diagnosis not present

## 2015-02-15 DIAGNOSIS — Z79899 Other long term (current) drug therapy: Secondary | ICD-10-CM | POA: Diagnosis not present

## 2015-02-15 DIAGNOSIS — R9431 Abnormal electrocardiogram [ECG] [EKG]: Secondary | ICD-10-CM | POA: Insufficient documentation

## 2015-02-15 HISTORY — DX: Reserved for inherently not codable concepts without codable children: IMO0001

## 2015-02-15 LAB — CBC
HEMATOCRIT: 41.3 % (ref 39.0–52.0)
HEMOGLOBIN: 13.3 g/dL (ref 13.0–17.0)
MCH: 30.2 pg (ref 26.0–34.0)
MCHC: 32.2 g/dL (ref 30.0–36.0)
MCV: 93.9 fL (ref 78.0–100.0)
Platelets: 223 10*3/uL (ref 150–400)
RBC: 4.4 MIL/uL (ref 4.22–5.81)
RDW: 13.5 % (ref 11.5–15.5)
WBC: 9.7 10*3/uL (ref 4.0–10.5)

## 2015-02-15 LAB — BASIC METABOLIC PANEL
ANION GAP: 12 (ref 5–15)
BUN: 20 mg/dL (ref 6–20)
CHLORIDE: 106 mmol/L (ref 101–111)
CO2: 23 mmol/L (ref 22–32)
Calcium: 9.4 mg/dL (ref 8.9–10.3)
Creatinine, Ser: 1.24 mg/dL (ref 0.61–1.24)
GFR calc non Af Amer: 58 mL/min — ABNORMAL LOW (ref >60)
GLUCOSE: 129 mg/dL — AB (ref 65–99)
Potassium: 4.4 mmol/L (ref 3.5–5.1)
Sodium: 141 mmol/L (ref 135–145)

## 2015-02-15 LAB — SURGICAL PCR SCREEN
MRSA, PCR: NEGATIVE
Staphylococcus aureus: POSITIVE — AB

## 2015-02-15 LAB — GLUCOSE, CAPILLARY: GLUCOSE-CAPILLARY: 170 mg/dL — AB (ref 65–99)

## 2015-02-15 NOTE — Progress Notes (Signed)
This pt. Scored at an elevated risk for Obstructive Sleep Apnea using the STOP BANG TOOL during a pre-surgical visit. A score of 5 or greater is considered an elevated risk.

## 2015-02-15 NOTE — Pre-Procedure Instructions (Signed)
Logan Hooper  02/15/2015      [facility] - [place], [place] - [id number] [place] [address] [place] [address] Phone: [phone] Fax: [phone]    Your procedure is scheduled on 02-26-2015   Tuesday .  Report to [facility] Admitting at 5:30 A.M.   Call this number if you have problems the morning of surgery:  [phone]   Remember:  Do not eat food or drink liquids after midnight.   Take these medicines the morning of surgery with A SIP OF WATER Amlodipine(Norvasc),pain medication if needed,              Stop Fish Oil 7 days prior to Surgery            How to Manage Your Diabetes Before Surgery   Why is it important to control my blood sugar before and after surgery?   Improving blood sugar levels before and after surgery helps healing and can limit problems.  A way of improving blood sugar control is eating a healthy diet by:  - Eating less sugar and carbohydrates  - Increasing activity/exercise  - Talk with your doctor about reaching your blood sugar goals  High blood sugars (greater than 180 mg/dL) can raise your risk of infections and slow down your recovery so you will need to focus on controlling your diabetes during the weeks before surgery.  Make sure that the doctor who takes care of your diabetes knows about your planned surgery including the date and location.  How do I manage my blood sugars before surgery?   Check your blood sugar at least 4 times a day, 2 days before surgery to make sure that they are not too high or low.   Check your blood sugar the morning of your surgery when you wake up and every 2  hours until you get to the Short-Stay unit.  If your blood sugar is less than 70 mg/dL, you will need to treat for low blood sugar by:  Treat a low blood sugar (less than 70 mg/dL) with 1/2 cup of clear juice (cranberry or apple), 4 glucose tablets, OR glucose gel.  Recheck blood sugar in 15 minutes after  treatment (to make sure it is greater than 70 mg/dL).  If blood sugar is not greater than 70 mg/dL on re-check, call [phone] for further instructions.   Report your blood sugar to the Short-Stay nurse when you get to Short-Stay.  References:  [facility], 2007 "How to Manage your Diabetes Before and After Surgery".  What do I do about my diabetes medications?   Do not take oral diabetes medicines (pills) the morning of surgery.    THE NIGHT BEFORE SURGERY, take 20 units of Lantus Insulin.           . .           Do not wear jewelry,   Do not wear lotions, powders, or perfumes.  You may not wear deodorant.  Do not shave 48 hours prior to surgery.  Men may shave face and neck.   Do not bring valuables to the hospital.  [facility] is not responsible for any belongings or valuables.  Contacts, dentures or bridgework may not be worn into surgery.  Leave your suitcase in the car.  After surgery it may be brought to your room.  For patients admitted to the hospital, discharge time will be determined by your treatment  team.  Patients discharged the day of surgery will not be allowed to drive home.    Special instructions:  See attached Sheet for instructions on CHG showers  Please read over the following fact sheets that you were given. Pain Booklet, Coughing and Deep Breathing and Surgical Site Infection Prevention

## 2015-02-16 LAB — HEMOGLOBIN A1C
Hgb A1c MFr Bld: 7.9 % — ABNORMAL HIGH (ref 4.8–5.6)
MEAN PLASMA GLUCOSE: 180 mg/dL

## 2015-02-18 NOTE — Progress Notes (Signed)
Anesthesia Chart Review: Patient is a 70 year old male scheduled for right TKA on 02/26/15 by Dr. Mardelle Matte.  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   PCP is Dr. Suszanne Finch, last visit 01/24/15. His note states that form was filled out to authorize knee surgery at that visit.   Patient is not routinely followed by cardiology, but he was referred to Dr. Domenic Polite in 01/2014 for atypical precordial pain and had a low risk stress test (see below.)  Meds include amlodipine, aspirin, fish oil-omega 3 fatty acids, Norco, Lantus, Novolog, losartan, metformin, Actos, prazosin, simvastatin, trazodone.   02/15/15 EKG X 2 noted: SB. RSR prime in V1 suggests right ventricular conduction delay. Minimal voltage criteria for LVH, may be normal variant. Cannot rule out anterior infarct (age undetermined). Overall, I think tracings are stable when compared to 01/18/14 tracing.  01/23/14 Nuclear stress test: IMPRESSION: 1. Soft tissue attenuation as noted above without definitive evidence of ischemia. 2. Normal left ventricular wall motion. 3. Left ventricular ejection fraction 55% 4. Low-risk stress test findings*. *2012 Appropriate Use Criteria for Coronary Revascularization Focused Update: J Am Coll Cardiol. 2012;59(9):857-881. Http://content.airportbarriers.com.aspx?articleid=1201161.  Preoperative labs noted. A1c 7.9.  If no acute changes then I anticipate the he can proceed as planned.  [facility] Short Stay Center/Anesthesiology Phone [phone] 02/18/2015 5:54 PM

## 2015-02-25 MED ORDER — CEFAZOLIN SODIUM-DEXTROSE 2-3 GM-% IV SOLR
2.0000 g | INTRAVENOUS | Status: AC
  Administered 2015-02-26: 2 g via INTRAVENOUS
  Filled 2015-02-25: qty 50

## 2015-02-25 NOTE — Anesthesia Preprocedure Evaluation (Signed)
Anesthesia Evaluation  Patient identified by MRN, date of birth, ID band Patient awake    Reviewed: Allergy & Precautions, H&P , Patient's Chart, lab work & pertinent test results, reviewed documented beta blocker date and time   Airway Mallampati: II  TM Distance: >3 FB Neck ROM: full    Dental no notable dental hx.    Pulmonary former smoker,    Pulmonary exam normal breath sounds clear to auscultation       Cardiovascular hypertension, On Medications  Rhythm:regular Rate:Normal     Neuro/Psych    GI/Hepatic   Endo/Other  diabetes, Insulin Dependent  Renal/GU      Musculoskeletal   Abdominal   Peds  Hematology   Anesthesia Other Findings   Reproductive/Obstetrics                             Anesthesia Physical Anesthesia Plan  ASA: II  Anesthesia Plan: General   Post-op Pain Management:    Induction: Intravenous  Airway Management Planned: Oral ETT  Additional Equipment:   Intra-op Plan:   Post-operative Plan: Extubation in OR  Informed Consent: I have reviewed the patients History and Physical, chart, labs and discussed the procedure including the risks, benefits and alternatives for the proposed anesthesia with the patient or authorized representative who has indicated his/her understanding and acceptance.   Dental Advisory Given and Dental advisory given  Plan Discussed with: CRNA and Surgeon  Anesthesia Plan Comments: (  Discussed general anesthesia, including possible nausea, instrumentation of airway, sore throat,pulmonary aspiration, etc. I asked if the were any outstanding questions, or  concerns before we proceeded. )        Anesthesia Quick Evaluation

## 2015-02-26 ENCOUNTER — Inpatient Hospital Stay (HOSPITAL_COMMUNITY): Payer: Medicare Other | Admitting: Vascular Surgery

## 2015-02-26 ENCOUNTER — Encounter (HOSPITAL_COMMUNITY)
Admission: RE | Disposition: A | Discharge status: Home Health | Payer: Self-pay | Source: Ambulatory Visit | Attending: Orthopedic Surgery

## 2015-02-26 ENCOUNTER — Inpatient Hospital Stay (HOSPITAL_COMMUNITY)
Admission: RE | Admit: 2015-02-26 | Discharge: 2015-02-27 | DRG: 470 | Disposition: A | Discharge status: Home Health | Payer: Medicare Other | Source: Ambulatory Visit | Attending: Orthopedic Surgery | Admitting: Orthopedic Surgery

## 2015-02-26 ENCOUNTER — Inpatient Hospital Stay (HOSPITAL_COMMUNITY): Payer: Medicare Other

## 2015-02-26 ENCOUNTER — Inpatient Hospital Stay (HOSPITAL_COMMUNITY): Payer: Medicare Other | Admitting: Anesthesiology

## 2015-02-26 ENCOUNTER — Encounter (HOSPITAL_COMMUNITY): Payer: Self-pay | Admitting: Certified Registered"

## 2015-02-26 DIAGNOSIS — Z791 Long term (current) use of non-steroidal anti-inflammatories (NSAID): Secondary | ICD-10-CM | POA: Diagnosis not present

## 2015-02-26 DIAGNOSIS — F4310 Post-traumatic stress disorder, unspecified: Secondary | ICD-10-CM | POA: Diagnosis present

## 2015-02-26 DIAGNOSIS — E1143 Type 2 diabetes mellitus with diabetic autonomic (poly)neuropathy: Secondary | ICD-10-CM | POA: Diagnosis present

## 2015-02-26 DIAGNOSIS — Z794 Long term (current) use of insulin: Secondary | ICD-10-CM

## 2015-02-26 DIAGNOSIS — E785 Hyperlipidemia, unspecified: Secondary | ICD-10-CM | POA: Diagnosis present

## 2015-02-26 DIAGNOSIS — Z79891 Long term (current) use of opiate analgesic: Secondary | ICD-10-CM

## 2015-02-26 DIAGNOSIS — Z79899 Other long term (current) drug therapy: Secondary | ICD-10-CM | POA: Diagnosis not present

## 2015-02-26 DIAGNOSIS — I10 Essential (primary) hypertension: Secondary | ICD-10-CM | POA: Diagnosis not present

## 2015-02-26 DIAGNOSIS — Z87891 Personal history of nicotine dependence: Secondary | ICD-10-CM

## 2015-02-26 DIAGNOSIS — M25561 Pain in right knee: Secondary | ICD-10-CM | POA: Diagnosis not present

## 2015-02-26 DIAGNOSIS — Z7984 Long term (current) use of oral hypoglycemic drugs: Secondary | ICD-10-CM

## 2015-02-26 DIAGNOSIS — M179 Osteoarthritis of knee, unspecified: Secondary | ICD-10-CM | POA: Diagnosis not present

## 2015-02-26 DIAGNOSIS — E1142 Type 2 diabetes mellitus with diabetic polyneuropathy: Secondary | ICD-10-CM | POA: Diagnosis not present

## 2015-02-26 DIAGNOSIS — Z471 Aftercare following joint replacement surgery: Secondary | ICD-10-CM | POA: Diagnosis not present

## 2015-02-26 DIAGNOSIS — Z7982 Long term (current) use of aspirin: Secondary | ICD-10-CM | POA: Diagnosis not present

## 2015-02-26 DIAGNOSIS — M1711 Unilateral primary osteoarthritis, right knee: Secondary | ICD-10-CM | POA: Diagnosis not present

## 2015-02-26 DIAGNOSIS — Z96659 Presence of unspecified artificial knee joint: Secondary | ICD-10-CM

## 2015-02-26 DIAGNOSIS — Z96651 Presence of right artificial knee joint: Secondary | ICD-10-CM | POA: Diagnosis not present

## 2015-02-26 HISTORY — DX: Unilateral primary osteoarthritis, right knee: M17.11

## 2015-02-26 HISTORY — PX: TOTAL KNEE ARTHROPLASTY: SHX125

## 2015-02-26 LAB — GLUCOSE, CAPILLARY
GLUCOSE-CAPILLARY: 132 mg/dL — AB (ref 65–99)
GLUCOSE-CAPILLARY: 285 mg/dL — AB (ref 65–99)
GLUCOSE-CAPILLARY: 294 mg/dL — AB (ref 65–99)
Glucose-Capillary: 115 mg/dL — ABNORMAL HIGH (ref 65–99)
Glucose-Capillary: 133 mg/dL — ABNORMAL HIGH (ref 65–99)

## 2015-02-26 SURGERY — ARTHROPLASTY, KNEE, TOTAL
Anesthesia: General | Laterality: Right

## 2015-02-26 MED ORDER — ACETAMINOPHEN 160 MG/5ML PO SOLN
975.0000 mg | ORAL | Status: AC
  Administered 2015-02-26: 975 mg via ORAL
  Filled 2015-02-26: qty 40

## 2015-02-26 MED ORDER — SENNA-DOCUSATE SODIUM 8.6-50 MG PO TABS: 2.0000 | ORAL_TABLET | Freq: Every day | ORAL | Status: DC

## 2015-02-26 MED ORDER — PRAZOSIN HCL 2 MG PO CAPS
2.0000 mg | ORAL_CAPSULE | Freq: Every day | ORAL | Status: DC
  Administered 2015-02-26: 2 mg via ORAL
  Filled 2015-02-26 (×3): qty 1

## 2015-02-26 MED ORDER — RIVAROXABAN 10 MG PO TABS
10.0000 mg | ORAL_TABLET | Freq: Every day | ORAL | Status: DC
  Administered 2015-02-27: 10 mg via ORAL
  Filled 2015-02-26: qty 1

## 2015-02-26 MED ORDER — HYDROMORPHONE HCL 1 MG/ML IJ SOLN: 0.5000 mg | INTRAMUSCULAR | Status: DC | PRN

## 2015-02-26 MED ORDER — ONDANSETRON HCL 4 MG/2ML IJ SOLN
INTRAMUSCULAR | Status: DC | PRN
  Administered 2015-02-26: 4 mg via INTRAVENOUS

## 2015-02-26 MED ORDER — CEFAZOLIN SODIUM-DEXTROSE 2-3 GM-% IV SOLR
2.0000 g | Freq: Four times a day (QID) | INTRAVENOUS | Status: AC
Start: 2015-02-26 — End: 2015-02-26
  Administered 2015-02-26 (×2): 2 g via INTRAVENOUS
  Filled 2015-02-26 (×3): qty 50

## 2015-02-26 MED ORDER — ONDANSETRON HCL 4 MG PO TABS: 4.0000 mg | ORAL_TABLET | Freq: Four times a day (QID) | ORAL | Status: DC | PRN

## 2015-02-26 MED ORDER — HYDROCODONE-ACETAMINOPHEN 10-325 MG PO TABS
1.0000 | ORAL_TABLET | ORAL | Status: DC | PRN
  Administered 2015-02-26 (×2): 2 via ORAL
  Administered 2015-02-27 (×3): 1 via ORAL
  Filled 2015-02-26: qty 2
  Filled 2015-02-26 (×2): qty 1
  Filled 2015-02-26: qty 2
  Filled 2015-02-26: qty 1

## 2015-02-26 MED ORDER — LOSARTAN POTASSIUM 50 MG PO TABS
100.0000 mg | ORAL_TABLET | Freq: Every day | ORAL | Status: DC
  Administered 2015-02-26 – 2015-02-27 (×2): 100 mg via ORAL
  Filled 2015-02-26 (×2): qty 2

## 2015-02-26 MED ORDER — ADULT MULTIVITAMIN W/MINERALS CH
1.0000 | ORAL_TABLET | Freq: Every day | ORAL | Status: DC
  Administered 2015-02-26 – 2015-02-27 (×2): 1 via ORAL
  Filled 2015-02-26 (×2): qty 1

## 2015-02-26 MED ORDER — MIDAZOLAM HCL 2 MG/2ML IJ SOLN
INTRAMUSCULAR | Status: DC | PRN
  Administered 2015-02-26 (×2): 1 mg via INTRAVENOUS

## 2015-02-26 MED ORDER — ACETAMINOPHEN 650 MG RE SUPP: 650.0000 mg | Freq: Four times a day (QID) | RECTAL | Status: DC | PRN

## 2015-02-26 MED ORDER — EPHEDRINE SULFATE 50 MG/ML IJ SOLN
INTRAMUSCULAR | Status: DC | PRN
  Administered 2015-02-26 (×2): 5 mg via INTRAVENOUS

## 2015-02-26 MED ORDER — BUPIVACAINE HCL (PF) 0.25 % IJ SOLN
INTRAMUSCULAR | Status: DC | PRN
  Administered 2015-02-26: 30 mL

## 2015-02-26 MED ORDER — METHOCARBAMOL 500 MG PO TABS
500.0000 mg | ORAL_TABLET | Freq: Four times a day (QID) | ORAL | Status: DC | PRN
  Administered 2015-02-26 (×2): 500 mg via ORAL
  Filled 2015-02-26 (×2): qty 1

## 2015-02-26 MED ORDER — TRAZODONE HCL 50 MG PO TABS: 50.0000 mg | ORAL_TABLET | Freq: Every evening | ORAL | Status: DC | PRN

## 2015-02-26 MED ORDER — MAGNESIUM CITRATE PO SOLN: 1.0000 | Freq: Once | ORAL | Status: DC | PRN

## 2015-02-26 MED ORDER — EPHEDRINE SULFATE 50 MG/ML IJ SOLN
INTRAMUSCULAR | Status: AC
  Filled 2015-02-26: qty 1

## 2015-02-26 MED ORDER — DIPHENHYDRAMINE HCL 12.5 MG/5ML PO ELIX
12.5000 mg | ORAL_SOLUTION | ORAL | Status: DC | PRN
Start: 2015-02-26 — End: 2015-02-27

## 2015-02-26 MED ORDER — RIVAROXABAN 10 MG PO TABS: 10.0000 mg | ORAL_TABLET | Freq: Every day | ORAL | Status: DC

## 2015-02-26 MED ORDER — HYDROMORPHONE HCL 1 MG/ML IJ SOLN
INTRAMUSCULAR | Status: AC
  Filled 2015-02-26: qty 1

## 2015-02-26 MED ORDER — OMEGA-3-ACID ETHYL ESTERS 1 G PO CAPS
1000.0000 mg | ORAL_CAPSULE | Freq: Two times a day (BID) | ORAL | Status: DC
  Administered 2015-02-26 – 2015-02-27 (×3): 1000 mg via ORAL
  Filled 2015-02-26 (×3): qty 1

## 2015-02-26 MED ORDER — LACTATED RINGERS IV SOLN
INTRAVENOUS | Status: DC | PRN
  Administered 2015-02-26 (×2): via INTRAVENOUS

## 2015-02-26 MED ORDER — AMLODIPINE BESYLATE 10 MG PO TABS
10.0000 mg | ORAL_TABLET | Freq: Every day | ORAL | Status: DC
  Administered 2015-02-27: 10 mg via ORAL
  Filled 2015-02-26: qty 1

## 2015-02-26 MED ORDER — PHENYLEPHRINE HCL 10 MG/ML IJ SOLN
10.0000 mg | INTRAVENOUS | Status: DC | PRN
  Administered 2015-02-26: 20 ug/min via INTRAVENOUS

## 2015-02-26 MED ORDER — ONDANSETRON HCL 4 MG/2ML IJ SOLN: 4.0000 mg | Freq: Four times a day (QID) | INTRAMUSCULAR | Status: DC | PRN

## 2015-02-26 MED ORDER — INSULIN GLARGINE 100 UNIT/ML ~~LOC~~ SOLN
25.0000 [IU] | Freq: Every day | SUBCUTANEOUS | Status: DC
  Administered 2015-02-26: 25 [IU] via SUBCUTANEOUS
  Filled 2015-02-26 (×2): qty 0.25

## 2015-02-26 MED ORDER — PIOGLITAZONE HCL 15 MG PO TABS
15.0000 mg | ORAL_TABLET | Freq: Every day | ORAL | Status: DC
  Administered 2015-02-27: 15 mg via ORAL
  Filled 2015-02-26: qty 1

## 2015-02-26 MED ORDER — SODIUM CHLORIDE 0.9 % IR SOLN
Status: DC | PRN
  Administered 2015-02-26: 1000 mL

## 2015-02-26 MED ORDER — ACETAMINOPHEN 325 MG PO TABS: 650.0000 mg | ORAL_TABLET | Freq: Four times a day (QID) | ORAL | Status: DC | PRN

## 2015-02-26 MED ORDER — PROPOFOL 500 MG/50ML IV EMUL
INTRAVENOUS | Status: DC | PRN
  Administered 2015-02-26: 50 ug/kg/min via INTRAVENOUS

## 2015-02-26 MED ORDER — FENTANYL CITRATE (PF) 250 MCG/5ML IJ SOLN
INTRAMUSCULAR | Status: DC | PRN
  Administered 2015-02-26 (×2): 25 ug via INTRAVENOUS
  Administered 2015-02-26: 50 ug via INTRAVENOUS
  Administered 2015-02-26 (×6): 25 ug via INTRAVENOUS

## 2015-02-26 MED ORDER — HYDROCORTISONE ACETATE 25 MG RE SUPP
25.0000 mg | Freq: Every day | RECTAL | Status: DC
  Filled 2015-02-26 (×2): qty 1

## 2015-02-26 MED ORDER — PROPOFOL 10 MG/ML IV BOLUS
INTRAVENOUS | Status: AC
  Filled 2015-02-26: qty 20

## 2015-02-26 MED ORDER — PHENOL 1.4 % MT LIQD: 1.0000 | OROMUCOSAL | Status: DC | PRN

## 2015-02-26 MED ORDER — KETOROLAC TROMETHAMINE 15 MG/ML IJ SOLN
7.5000 mg | Freq: Four times a day (QID) | INTRAMUSCULAR | Status: AC
  Administered 2015-02-26 – 2015-02-27 (×4): 7.5 mg via INTRAVENOUS
  Filled 2015-02-26 (×3): qty 1

## 2015-02-26 MED ORDER — BUPIVACAINE HCL (PF) 0.25 % IJ SOLN
INTRAMUSCULAR | Status: AC
  Filled 2015-02-26: qty 30

## 2015-02-26 MED ORDER — MIDAZOLAM HCL 2 MG/2ML IJ SOLN
INTRAMUSCULAR | Status: AC
  Filled 2015-02-26: qty 2

## 2015-02-26 MED ORDER — BISACODYL 10 MG RE SUPP: 10.0000 mg | Freq: Every day | RECTAL | Status: DC | PRN

## 2015-02-26 MED ORDER — ASPIRIN 81 MG PO CHEW
81.0000 mg | CHEWABLE_TABLET | Freq: Every day | ORAL | Status: DC
  Administered 2015-02-26 – 2015-02-27 (×2): 81 mg via ORAL
  Filled 2015-02-26 (×2): qty 1

## 2015-02-26 MED ORDER — HYDROMORPHONE HCL 1 MG/ML IJ SOLN
0.2500 mg | INTRAMUSCULAR | Status: DC | PRN
  Administered 2015-02-26 (×4): 0.5 mg via INTRAVENOUS

## 2015-02-26 MED ORDER — ONDANSETRON HCL 4 MG PO TABS: 4.0000 mg | ORAL_TABLET | Freq: Three times a day (TID) | ORAL | Status: DC | PRN

## 2015-02-26 MED ORDER — HYDROCODONE-ACETAMINOPHEN 10-325 MG PO TABS: 1.0000 | ORAL_TABLET | Freq: Two times a day (BID) | ORAL | Status: DC

## 2015-02-26 MED ORDER — BACLOFEN 10 MG PO TABS: 10.0000 mg | ORAL_TABLET | Freq: Three times a day (TID) | ORAL | Status: DC

## 2015-02-26 MED ORDER — METHOCARBAMOL 1000 MG/10ML IJ SOLN
500.0000 mg | Freq: Four times a day (QID) | INTRAVENOUS | Status: DC | PRN
  Filled 2015-02-26: qty 5

## 2015-02-26 MED ORDER — PHENYLEPHRINE 40 MCG/ML (10ML) SYRINGE FOR IV PUSH (FOR BLOOD PRESSURE SUPPORT)
PREFILLED_SYRINGE | INTRAVENOUS | Status: AC
  Filled 2015-02-26: qty 10

## 2015-02-26 MED ORDER — SIMVASTATIN 40 MG PO TABS
40.0000 mg | ORAL_TABLET | Freq: Every day | ORAL | Status: DC
  Administered 2015-02-26: 40 mg via ORAL
  Filled 2015-02-26: qty 1

## 2015-02-26 MED ORDER — STERILE WATER FOR INJECTION IJ SOLN
INTRAMUSCULAR | Status: AC
  Filled 2015-02-26: qty 10

## 2015-02-26 MED ORDER — DEXAMETHASONE SODIUM PHOSPHATE 10 MG/ML IJ SOLN
10.0000 mg | Freq: Once | INTRAMUSCULAR | Status: AC
  Administered 2015-02-27: 10 mg via INTRAVENOUS
  Filled 2015-02-26: qty 1

## 2015-02-26 MED ORDER — SENNA 8.6 MG PO TABS
1.0000 | ORAL_TABLET | Freq: Two times a day (BID) | ORAL | Status: DC
  Administered 2015-02-26 – 2015-02-27 (×2): 8.6 mg via ORAL
  Filled 2015-02-26 (×2): qty 1

## 2015-02-26 MED ORDER — KETOROLAC TROMETHAMINE 15 MG/ML IJ SOLN
INTRAMUSCULAR | Status: AC
  Filled 2015-02-26: qty 1

## 2015-02-26 MED ORDER — METHOCARBAMOL 500 MG PO TABS
ORAL_TABLET | ORAL | Status: AC
  Filled 2015-02-26: qty 1

## 2015-02-26 MED ORDER — LIDOCAINE HCL (CARDIAC) 20 MG/ML IV SOLN
INTRAVENOUS | Status: AC
  Filled 2015-02-26: qty 5

## 2015-02-26 MED ORDER — PROPOFOL 10 MG/ML IV BOLUS
INTRAVENOUS | Status: DC | PRN
  Administered 2015-02-26: 30 mg via INTRAVENOUS
  Administered 2015-02-26: 40 mg via INTRAVENOUS

## 2015-02-26 MED ORDER — METOCLOPRAMIDE HCL 5 MG/ML IJ SOLN
5.0000 mg | Freq: Three times a day (TID) | INTRAMUSCULAR | Status: DC | PRN
Start: 2015-02-26 — End: 2015-02-27

## 2015-02-26 MED ORDER — ALUM & MAG HYDROXIDE-SIMETH 200-200-20 MG/5ML PO SUSP: 30.0000 mL | ORAL | Status: DC | PRN

## 2015-02-26 MED ORDER — METFORMIN HCL 500 MG PO TABS
1000.0000 mg | ORAL_TABLET | Freq: Two times a day (BID) | ORAL | Status: DC
  Administered 2015-02-26 – 2015-02-27 (×2): 1000 mg via ORAL
  Filled 2015-02-26 (×2): qty 2

## 2015-02-26 MED ORDER — METOCLOPRAMIDE HCL 5 MG PO TABS: 5.0000 mg | ORAL_TABLET | Freq: Three times a day (TID) | ORAL | Status: DC | PRN

## 2015-02-26 MED ORDER — ZOLPIDEM TARTRATE 5 MG PO TABS: 5.0000 mg | ORAL_TABLET | Freq: Every evening | ORAL | Status: DC | PRN

## 2015-02-26 MED ORDER — FENTANYL CITRATE (PF) 250 MCG/5ML IJ SOLN
INTRAMUSCULAR | Status: AC
  Filled 2015-02-26: qty 5

## 2015-02-26 MED ORDER — GLYCOPYRROLATE 0.2 MG/ML IJ SOLN
INTRAMUSCULAR | Status: AC
  Filled 2015-02-26: qty 1

## 2015-02-26 MED ORDER — POLYETHYLENE GLYCOL 3350 17 G PO PACK: 17.0000 g | PACK | Freq: Every day | ORAL | Status: DC | PRN

## 2015-02-26 MED ORDER — MENTHOL 3 MG MT LOZG: 1.0000 | LOZENGE | OROMUCOSAL | Status: DC | PRN

## 2015-02-26 MED ORDER — DOCUSATE SODIUM 100 MG PO CAPS
100.0000 mg | ORAL_CAPSULE | Freq: Two times a day (BID) | ORAL | Status: DC
  Administered 2015-02-26 – 2015-02-27 (×2): 100 mg via ORAL
  Filled 2015-02-26 (×2): qty 1

## 2015-02-26 MED ORDER — ONDANSETRON HCL 4 MG/2ML IJ SOLN
INTRAMUSCULAR | Status: AC
  Filled 2015-02-26: qty 2

## 2015-02-26 MED ORDER — INSULIN ASPART 100 UNIT/ML ~~LOC~~ SOLN
0.0000 [IU] | Freq: Three times a day (TID) | SUBCUTANEOUS | Status: DC
  Administered 2015-02-26: 8 [IU] via SUBCUTANEOUS
  Administered 2015-02-27 (×2): 5 [IU] via SUBCUTANEOUS

## 2015-02-26 MED ORDER — POTASSIUM CHLORIDE IN NACL 20-0.45 MEQ/L-% IV SOLN
INTRAVENOUS | Status: DC
  Administered 2015-02-26: 15:00:00 via INTRAVENOUS
  Filled 2015-02-26 (×4): qty 1000

## 2015-02-26 SURGICAL SUPPLY — 64 items
BANDAGE ACE 4X5 VEL STRL LF (GAUZE/BANDAGES/DRESSINGS) ×2 IMPLANT
BANDAGE ACE 6X5 VEL STRL LF (GAUZE/BANDAGES/DRESSINGS) ×2 IMPLANT
BANDAGE ELASTIC 6 VELCRO ST LF (GAUZE/BANDAGES/DRESSINGS) ×4 IMPLANT
BANDAGE ESMARK 6X9 LF (GAUZE/BANDAGES/DRESSINGS) ×1 IMPLANT
BENZOIN TINCTURE PRP APPL 2/3 (GAUZE/BANDAGES/DRESSINGS) ×2 IMPLANT
BLADE SAG 18X100X1.27 (BLADE) ×2 IMPLANT
BLADE SAW RECIP 87.9 MT (BLADE) ×2 IMPLANT
BLADE SAW SGTL 13X75X1.27 (BLADE) ×2 IMPLANT
BNDG ESMARK 6X9 LF (GAUZE/BANDAGES/DRESSINGS) ×2
BOOTCOVER CLEANROOM LRG (PROTECTIVE WEAR) ×4 IMPLANT
BOWL SMART MIX CTS (DISPOSABLE) ×2 IMPLANT
CAPT KNEE TOTAL 3 ATTUNE ×2 IMPLANT
CEMENT HV SMART SET (Cement) ×4 IMPLANT
CLSR STERI-STRIP ANTIMIC 1/2X4 (GAUZE/BANDAGES/DRESSINGS) ×2 IMPLANT
COVER SURGICAL LIGHT HANDLE (MISCELLANEOUS) ×2 IMPLANT
CUFF TOURNIQUET SINGLE 34IN LL (TOURNIQUET CUFF) ×2 IMPLANT
DRAPE EXTREMITY T 121X128X90 (DRAPE) ×2 IMPLANT
DRAPE U-SHAPE 47X51 STRL (DRAPES) ×2 IMPLANT
DURAPREP 26ML APPLICATOR (WOUND CARE) ×2 IMPLANT
ELECT CAUTERY BLADE 6.4 (BLADE) ×2 IMPLANT
ELECT REM PT RETURN 9FT ADLT (ELECTROSURGICAL) ×2
ELECTRODE REM PT RTRN 9FT ADLT (ELECTROSURGICAL) ×1 IMPLANT
FACESHIELD STD STERILE (MASK) ×2 IMPLANT
GAUZE SPONGE 4X4 12PLY STRL (GAUZE/BANDAGES/DRESSINGS) ×2 IMPLANT
GLOVE BIOGEL PI IND STRL 8 (GLOVE) ×1 IMPLANT
GLOVE BIOGEL PI INDICATOR 8 (GLOVE) ×1
GLOVE BIOGEL PI ORTHO PRO SZ8 (GLOVE) ×1
GLOVE ORTHO TXT STRL SZ7.5 (GLOVE) ×2 IMPLANT
GLOVE PI ORTHO PRO STRL SZ8 (GLOVE) ×1 IMPLANT
GLOVE SURG ORTHO 8.0 STRL STRW (GLOVE) ×2 IMPLANT
GOWN STRL REUS W/ TWL XL LVL3 (GOWN DISPOSABLE) ×1 IMPLANT
GOWN STRL REUS W/TWL 2XL LVL3 (GOWN DISPOSABLE) ×2 IMPLANT
GOWN STRL REUS W/TWL XL LVL3 (GOWN DISPOSABLE) ×1
HANDPIECE INTERPULSE COAX TIP (DISPOSABLE) ×1
HOOD PEEL AWAY FACE SHEILD DIS (HOOD) ×4 IMPLANT
IMMOBILIZER KNEE 22 (SOFTGOODS) ×2 IMPLANT
KIT BASIN OR (CUSTOM PROCEDURE TRAY) ×2 IMPLANT
KIT ROOM TURNOVER OR (KITS) ×2 IMPLANT
MANIFOLD NEPTUNE II (INSTRUMENTS) ×2 IMPLANT
NEEDLE 18GX1X1/2 (RX/OR ONLY) (NEEDLE) ×2 IMPLANT
NS IRRIG 1000ML POUR BTL (IV SOLUTION) ×2 IMPLANT
PACK TOTAL JOINT (CUSTOM PROCEDURE TRAY) ×2 IMPLANT
PACK UNIVERSAL I (CUSTOM PROCEDURE TRAY) ×2 IMPLANT
PAD ABD 8X10 STRL (GAUZE/BANDAGES/DRESSINGS) ×2 IMPLANT
PAD ARMBOARD 7.5X6 YLW CONV (MISCELLANEOUS) ×4 IMPLANT
PAD CAST 4YDX4 CTTN HI CHSV (CAST SUPPLIES) ×1 IMPLANT
PADDING CAST COTTON 4X4 STRL (CAST SUPPLIES) ×1
PADDING CAST COTTON 6X4 STRL (CAST SUPPLIES) ×2 IMPLANT
SET HNDPC FAN SPRY TIP SCT (DISPOSABLE) ×1 IMPLANT
SPONGE GAUZE 4X4 12PLY STER LF (GAUZE/BANDAGES/DRESSINGS) ×2 IMPLANT
SUCTION FRAZIER HANDLE 10FR (MISCELLANEOUS) ×1
SUCTION TUBE FRAZIER 10FR DISP (MISCELLANEOUS) ×1 IMPLANT
SUT MNCRL AB 4-0 PS2 18 (SUTURE) IMPLANT
SUT VIC AB 0 CT1 27 (SUTURE) ×1
SUT VIC AB 0 CT1 27XBRD ANBCTR (SUTURE) ×1 IMPLANT
SUT VIC AB 2-0 CT1 27 (SUTURE) ×1
SUT VIC AB 2-0 CT1 TAPERPNT 27 (SUTURE) ×1 IMPLANT
SUT VIC AB 3-0 SH 8-18 (SUTURE) ×4 IMPLANT
SYR 30ML LL (SYRINGE) IMPLANT
SYR 50ML LL SCALE MARK (SYRINGE) ×2 IMPLANT
TOWEL OR 17X24 6PK STRL BLUE (TOWEL DISPOSABLE) ×2 IMPLANT
TOWEL OR 17X26 10 PK STRL BLUE (TOWEL DISPOSABLE) ×2 IMPLANT
TRAY CATH 16FR W/PLASTIC CATH (SET/KITS/TRAYS/PACK) IMPLANT
WATER STERILE IRR 1000ML POUR (IV SOLUTION) ×4 IMPLANT

## 2015-02-26 NOTE — Anesthesia Postprocedure Evaluation (Signed)
Anesthesia Post Note  Patient: Logan Hooper  Procedure(s) Performed: Procedure(s) (LRB): TOTAL KNEE ARTHROPLASTY (Right)  Patient location during evaluation: PACU Anesthesia Type: General Level of consciousness: sedated Pain management: satisfactory to patient Vital Signs Assessment: post-procedure vital signs reviewed and stable Respiratory status: spontaneous breathing Cardiovascular status: stable Anesthetic complications: no    Last Vitals:  Filed Vitals:   02/26/15 1200 02/26/15 1215  BP: 156/82 148/77  Pulse: 73 75  Temp:    Resp: 14 15    Last Pain:  Filed Vitals:   02/26/15 1226  PainSc: 2                  Thao Vanover EDWARD

## 2015-02-26 NOTE — Anesthesia Procedure Notes (Signed)
Spinal  Patient location during procedure: OR Preanesthetic Checklist Completed: patient identified, site marked, surgical consent, pre-op evaluation, timeout performed, IV checked, risks and benefits discussed and monitors and equipment checked Spinal Block Patient position: sitting Prep: DuraPrep Patient monitoring: heart rate, cardiac monitor, continuous pulse ox and blood pressure Approach: midline Location: L3-4 Injection technique: single-shot Needle Needle type: Sprotte  Needle gauge: 24 G Needle length: 9 cm Assessment Sensory level: T4 Additional Notes Spinal Dosage in OR  Bupivicaine ml       1.6 RLD x 3 min     

## 2015-02-26 NOTE — Progress Notes (Signed)
Utilization review completed.  

## 2015-02-26 NOTE — Op Note (Signed)
DATE OF SURGERY:  02/26/2015 TIME: 10:02 AM  PATIENT NAME:  Logan Hooper   AGE: 70 y.o.    PRE-OPERATIVE DIAGNOSIS:  Right knee primary localized osteoarthritis  POST-OPERATIVE DIAGNOSIS:  Same  PROCEDURE:  Procedure(s): TOTAL KNEE ARTHROPLASTY   SURGEON:  Johnny Bridge, MD   ASSISTANT:  Joya Gaskins, OPA-C, present and scrubbed throughout the case, critical for assistance with exposure, retraction, instrumentation, and closure.   OPERATIVE IMPLANTS: Depuy Attune fixed bearing posterior stabilized size 8 right femur, size 41 mm patellar button, with a size 8 Attune tibial insert with a size 8 tibial fixed bearing tray   PREOPERATIVE INDICATIONS:  Logan Hooper is a 70 y.o. year old male with end stage bone on bone degenerative arthritis of the knee who failed conservative treatment, including injections, antiinflammatories, activity modification, and assistive devices, and had significant impairment of their activities of daily living, and elected for Total Knee Arthroplasty.   The risks, benefits, and alternatives were discussed at length including but not limited to the risks of infection, bleeding, nerve injury, stiffness, blood clots, the need for revision surgery, cardiopulmonary complications, among others, and they were willing to proceed.  OPERATIVE FINDINGS AND UNIQUE ASPECTS OF THE CASE:  There was extensive grade 4 chondral loss on both the patella as well as the medial femoral condyle, and to a lesser degree on the lateral compartment. He was definitely not a unicompartmental knee candidate. There were loose bodies in the back, as well as a 10 flexion contracture.  I had to cut the femur twice, and also the tibia twice, and also I had to go back and recut the box because the trial would not sit down completely. The bone quality was extremely robust.  OPERATIVE DESCRIPTION:  The patient was brought to the operative room and placed in a supine position.  Spinal  administered with a Foley.  IV antibiotics were given.  The lower extremity was prepped and draped in the usual sterile fashion.  Time out was performed.  The leg was elevated and exsanguinated and the tourniquet was inflated.  Anterior quadriceps tendon splitting approach was performed.  The patella was everted and osteophytes were removed.  The anterior horn of the medial and lateral meniscus was removed.   The distal femur was opened with the drill and the intramedullary distal femoral cutting jig was utilized, set at 5 degrees resecting 9 mm off the distal femur.  Care was taken to protect the collateral ligaments.  Then the extramedullary tibial cutting jig was utilized making the appropriate cut using the anterior tibial crest as a reference building in appropriate posterior slope.  Care was taken during the cut to protect the medial and collateral ligaments.  The proximal tibia was removed along with the posterior horns of the menisci.  The PCL was sacrificed.    The extensor gap was measured and was too tight, and so I then made a second cut on both the tibia and femur, and then the extensor gap measured to a size 6.    The distal femoral sizing jig was applied, taking care to avoid notching.  Then the 4-in-1 cutting jig was applied and the anterior and posterior femur was cut, along with the chamfer cuts.  All posterior osteophytes were removed.  The flexion gap was then measured and was symmetric with the extension gap.  I completed the distal femoral preparation using the appropriate jig to prepare the box. Initially, I did not match the appropriate contour  and the trial would not sit down, and so I went back and recut the box, which allowed for appropriate seating of the trial.  The patella was then measured, and cut with the saw.  The thickness before the cut was 25 and after the cut was 16.  The proximal tibia sized and prepared accordingly with the reamer and the punch, and then all  components were trialed with the trial poly insert.  The knee was found to have excellent balance and full motion.    The above named components were then cemented into place and all excess cement was removed.  The real polyethylene implant was placed.  After the cement had cured I released the tourniquet and confirmed excellent hemostasis with no major posterior vessel injury.  There was a small opening on the lateral aspect of the patellar tendon where the retinaculum had been released during exposure.  The knee was easily taken through a range of motion and the patella tracked well and the knee irrigated copiously and the parapatellar and subcutaneous tissue closed with vicryl, and monocryl with steri strips for the skin.  The wounds were injected with marcaine, and dressed with sterile gauze and the patient was awakened and returned to the PACU in stable and satisfactory condition.  There were no complications.  Total tourniquet time was ~120 minutes.

## 2015-02-26 NOTE — H&P (Signed)
PREOPERATIVE H&P  Chief Complaint: djd right knee  HPI: Logan Hooper is a 70 y.o. male who presents for preoperative history and physical with a diagnosis of djd right knee. Symptoms are rated as moderate to severe, and have been worsening.  This is significantly impairing activities of daily living.  He has elected for surgical management.   He has failed injections, activity modification, anti-inflammatories, and assistive devices.  Preoperative X-rays demonstrate end stage degenerative changes with osteophyte formation, loss of joint space, subchondral sclerosis.   Past Medical History  Diagnosis Date  . Essential hypertension   . Hyperlipidemia   . Type 2 diabetes mellitus with peripheral neuropathy (HCC)   . Arthritis   . ED (erectile dysfunction)   . Sciatica   . PTSD (post-traumatic stress disorder)   . Shortness of breath dyspnea     with exertion  . Nephrolithiasis     30 % use of right kidney  . Renal insufficiency    Past Surgical History  Procedure Laterality Date  . Back surgery    . Video assisted thoracoscopy (vats)/decortication Left 2009    Dr. Arlyce Dice  -left-sided empyema  . Colonoscopy    . Rotator cuff repair      BILATERAL  . Removal of kidney  stones     Social History   Social History  . Marital Status: Married    Spouse Name: N/A  . Number of Children: N/A  . Years of Education: N/A   Social History Main Topics  . Smoking status: Former Smoker    Types: Cigarettes, Cigars    Start date: 01/05/1962    Quit date: 01/06/2007  . Smokeless tobacco: Never Used  . Alcohol Use: No     Comment: BEER-OCC.  . Drug Use: No  . Sexual Activity: Not on file   Other Topics Concern  . Not on file   Social History Narrative   Family History  Problem Relation Age of Onset  . Heart disease Mother     Reportedly diagnosed in her 13s  . Diabetes Mother   . Dementia Father    No Known Allergies Prior to Admission medications   Medication Sig  Start Date End Date Taking? Authorizing Provider  amLODipine (NORVASC) 10 MG tablet Take 1 tablet (10 mg total) by mouth daily. 12/18/14  Yes Mikey Kirschner, MD  aspirin 81 MG tablet Take 81 mg by mouth daily.   Yes Historical Provider, MD  diclofenac (VOLTAREN) 75 MG EC tablet Take 1 tablet (75 mg total) by mouth 2 (two) times daily. 06/14/12  Yes Mikey Kirschner, MD  fish oil-omega-3 fatty acids 1000 MG capsule Take 1,000 mg by mouth 2 (two) times daily.    Yes Historical Provider, MD  HYDROcodone-acetaminophen (NORCO) 10-325 MG per tablet Take 1 tablet by mouth every 12 (twelve) hours.   Yes Historical Provider, MD  insulin aspart (NOVOLOG) 100 UNIT/ML injection Sliding scale with mealsSliding scale with meals 12/18/14  Yes Mikey Kirschner, MD  insulin glargine (LANTUS) 100 UNIT/ML injection Inject 0.25 mLs (25 Units total) into the skin at bedtime. 12/18/14  Yes Mikey Kirschner, MD  losartan (COZAAR) 100 MG tablet Take 1 tablet (100 mg total) by mouth daily. 12/18/14  Yes Mikey Kirschner, MD  metFORMIN (GLUCOPHAGE) 1000 MG tablet Take 1 tablet (1,000 mg total) by mouth 2 (two) times daily with a meal. 12/18/14  Yes Mikey Kirschner, MD  Multiple Vitamins-Minerals (MULTIVITAMINS THER. W/MINERALS) TABS Take 1 tablet  by mouth daily.   Yes Historical Provider, MD  pioglitazone (ACTOS) 15 MG tablet Take 1 tablet (15 mg total) by mouth daily. 12/18/14  Yes Mikey Kirschner, MD  prazosin (MINIPRESS) 1 MG capsule Take 1 mg by mouth. Take 2 tablets qhs   Yes Historical Provider, MD  simvastatin (ZOCOR) 40 MG tablet Take 40 mg by mouth at bedtime.   Yes Historical Provider, MD  traZODone (DESYREL) 150 MG tablet Take 150 mg by mouth. Take one third to one half by mouth qhs and may repeat one time   Yes Historical Provider, MD  amoxicillin (AMOXIL) 500 MG capsule Take 1 tablet by mouth three times a day for 7 days. Patient not taking: Reported on 02/14/2015 01/24/15   Mikey Kirschner, MD  hydrocortisone  (ANUSOL-HC) 25 MG suppository Place 1 suppository (25 mg total) rectally at bedtime. 07/17/14   Lafayette Dragon, MD     Positive ROS: All other systems have been reviewed and were otherwise negative with the exception of those mentioned in the HPI and as above.  Physical Exam: General: Alert, no acute distress Cardiovascular: No pedal edema Respiratory: No cyanosis, no use of accessory musculature GI: No organomegaly, abdomen is soft and non-tender Skin: No lesions in the area of chief complaint Neurologic: Sensation intact distally Psychiatric: Patient is competent for consent with normal mood and affect Lymphatic: No axillary or cervical lymphadenopathy  MUSCULOSKELETAL: right knee ROM 0-125 with crepitance and pain diffusely  Assessment: Right knee primary localized osteoarthritis   Plan: Plan for Procedure(s): TOTAL KNEE ARTHROPLASTY  The risks benefits and alternatives were discussed with the patient including but not limited to the risks of nonoperative treatment, versus surgical intervention including infection, bleeding, nerve injury,  blood clots, cardiopulmonary complications, morbidity, mortality, among others, and they were willing to proceed.   Johnny Bridge, MD Cell [phone]   02/26/2015 6:18 AM

## 2015-02-26 NOTE — Evaluation (Signed)
Physical Therapy Evaluation Patient Details Name: Logan Hooper MRN: [id number] DOB: [birth year] Today's Date: 02/26/2015   History of Present Illness  70 y.o. male admitted to [facility] on 02/26/15 for elective R TKA.  Pt with significant PMHx of essential HTN, sciatica, PTSD, SOB with exertion, nephrolithiasis, DM2 with peripherial neuropathy, back surgery, and bil RTC repair.   Clinical Impression  Pt is POD #0 and is moving well, min assist overall with RW in-room distance gait. He will likely progress well enough to d/c home with family's assist and HHPT f/u at discharge.   PT to follow acutely for deficits listed below.       Follow Up Recommendations Home health PT;Supervision for mobility/OOB    Equipment Recommendations  None recommended by PT    Recommendations for Other Services   NA    Precautions / Restrictions Precautions Precautions: Knee Precaution Booklet Issued: Yes (comment) Precaution Comments: knee exercise handout given Required Braces or Orthoses: Knee Immobilizer - Right Restrictions Weight Bearing Restrictions: Yes RLE Weight Bearing: Weight bearing as tolerated      Mobility  Bed Mobility Overal bed mobility: Modified Independent             General bed mobility comments: Pt was able to get EOB with HOB elevated and use of the railing.   Transfers Overall transfer level: Needs assistance Equipment used: Rolling walker (2 wheeled) Transfers: Sit to/from Stand Sit to Stand: Min assist         General transfer comment: Min assist to support trunk during transitions. Verbal cues for safe hand placement as pt wanted to put both hands on RW and pull to sit.    Ambulation/Gait Ambulation/Gait assistance: Min assist Ambulation Distance (Feet): 8 Feet Assistive device: Rolling walker (2 wheeled) Gait Pattern/deviations: Step-to pattern;Antalgic Gait velocity: decreased   General Gait Details: Min assist to support trunk for balance during gait.   Verbal cues for correct LE sequencing.       Balance Overall balance assessment: Needs assistance Sitting-balance support: Feet supported;No upper extremity supported Sitting balance-Leahy Scale: Good     Standing balance support: Bilateral upper extremity supported Standing balance-Leahy Scale: Poor                               Pertinent Vitals/Pain Pain Assessment: 0-10 Pain Score: 2  Pain Location: right knee Pain Descriptors / Indicators: Burning;Aching Pain Intervention(s): Limited activity within patient's tolerance;Monitored during session;Repositioned    Home Living Family/patient expects to be discharged to:: Private residence Living Arrangements: Spouse/significant other Available Help at Discharge: Family;Available 24 hours/day Type of Home: House Home Access: Stairs to enter Entrance Stairs-Rails: None Entrance Stairs-Number of Steps: 2 (carport entrance, small steps) Home Layout: Laundry or work area in basement;One level Home Equipment: [place] - 2 wheels;Cane - single point;Shower seat - built in;Bedside commode      Prior Function Level of Independence: Independent         Comments: retired     Journalist, newspaper   Dominant Hand: Right    Extremity/Trunk Assessment   Upper Extremity Assessment: Defer to OT evaluation           Lower Extremity Assessment: RLE deficits/detail RLE Deficits / Details: right leg with normal post op pain and weakness, ankle 3/5, knee 2/5, hip flexion 3-/5    Cervical / Trunk Assessment: Other exceptions  Communication   Communication: No difficulties  Cognition Arousal/Alertness: Awake/alert Behavior During Therapy:  WFL for tasks assessed/performed Overall Cognitive Status: Within Functional Limits for tasks assessed                         Exercises Total Joint Exercises Ankle Circles/Pumps: AROM;Both;20 reps      Assessment/Plan    PT Assessment Patient needs continued PT  services  PT Diagnosis Difficulty walking;Abnormality of gait;Generalized weakness;Acute pain   PT Problem List Decreased strength;Decreased range of motion;Decreased activity tolerance;Decreased balance;Decreased mobility;Decreased knowledge of use of DME;Pain  PT Treatment Interventions DME instruction;Gait training;Stair training;Functional mobility training;Therapeutic activities;Therapeutic exercise;Balance training;Patient/family education;Manual techniques;Modalities   PT Goals (Current goals can be found in the Care Plan section) Acute Rehab PT Goals Patient Stated Goal: to get back to playing golf PT Goal Formulation: With patient Time For Goal Achievement: 03/05/15 Potential to Achieve Goals: Good    Frequency 7X/week           End of Session Equipment Utilized During Treatment: Right knee immobilizer Activity Tolerance: Patient tolerated treatment well Patient left: in chair;with call bell/phone within reach;with family/visitor present           Time: 1710-1733 PT Time Calculation (min) (ACUTE ONLY): 23 min   Charges:   PT Evaluation $PT Eval Moderate Complexity: 1 Procedure PT Treatments $Therapeutic Activity: 8-22 mins        Ivanna Kocak B. Aretha Levi, PT, DPT [phone]   02/26/2015, 5:46 PM

## 2015-02-26 NOTE — Progress Notes (Signed)
Orthopedic Tech Progress Note Patient Details:  Logan Hooper 24-Jul-1945 [id number]  Ortho Devices Type of Ortho Device: Knee Immobilizer Ortho Device/Splint Interventions: Application   [name] 02/26/2015, 3:34 PM

## 2015-02-26 NOTE — Progress Notes (Signed)
Upper & lower plate ret to pt.

## 2015-02-26 NOTE — Transfer of Care (Signed)
Immediate Anesthesia Transfer of Care Note  Patient: Logan Hooper  Procedure(s) Performed: Procedure(s): TOTAL KNEE ARTHROPLASTY (Right)  Patient Location: PACU  Anesthesia Type:Spinal  Level of Consciousness: awake, alert , oriented and patient cooperative  Airway & Oxygen Therapy: Patient Spontanous Breathing and Patient connected to face mask oxygen  Post-op Assessment: Report given to RN and Post -op Vital signs reviewed and stable  Post vital signs: Reviewed and stable  Last Vitals:  Filed Vitals:   02/26/15 0620  BP: 179/75  Pulse: 70  Temp: 36.3 C  Resp: 20    Complications: No apparent anesthesia complications

## 2015-02-27 ENCOUNTER — Encounter (HOSPITAL_COMMUNITY): Payer: Self-pay | Admitting: Orthopedic Surgery

## 2015-02-27 LAB — CBC
HCT: 31.8 % — ABNORMAL LOW (ref 39.0–52.0)
Hemoglobin: 10.4 g/dL — ABNORMAL LOW (ref 13.0–17.0)
MCH: 31 pg (ref 26.0–34.0)
MCHC: 32.7 g/dL (ref 30.0–36.0)
MCV: 94.6 fL (ref 78.0–100.0)
PLATELETS: 180 10*3/uL (ref 150–400)
RBC: 3.36 MIL/uL — ABNORMAL LOW (ref 4.22–5.81)
RDW: 13.4 % (ref 11.5–15.5)
WBC: 9.4 10*3/uL (ref 4.0–10.5)

## 2015-02-27 LAB — BASIC METABOLIC PANEL
Anion gap: 10 (ref 5–15)
BUN: 25 mg/dL — AB (ref 6–20)
CO2: 27 mmol/L (ref 22–32)
CREATININE: 1.41 mg/dL — AB (ref 0.61–1.24)
Calcium: 8.8 mg/dL — ABNORMAL LOW (ref 8.9–10.3)
Chloride: 97 mmol/L — ABNORMAL LOW (ref 101–111)
GFR calc Af Amer: 57 mL/min — ABNORMAL LOW (ref >60)
GFR, EST NON AFRICAN AMERICAN: 49 mL/min — AB (ref >60)
GLUCOSE: 238 mg/dL — AB (ref 65–99)
Potassium: 4.2 mmol/L (ref 3.5–5.1)
SODIUM: 134 mmol/L — AB (ref 135–145)

## 2015-02-27 LAB — GLUCOSE, CAPILLARY
GLUCOSE-CAPILLARY: 224 mg/dL — AB (ref 65–99)
Glucose-Capillary: 217 mg/dL — ABNORMAL HIGH (ref 65–99)

## 2015-02-27 NOTE — Discharge Summary (Signed)
Physician Discharge Summary  Patient ID: Logan Hooper MRN: [id number] DOB/AGE: 69/15/1947 69 y.o.  Admit date: 02/26/2015 Discharge date: 02/27/2015  Admission Diagnoses:  Primary localized osteoarthritis of right knee  Discharge Diagnoses:  Principal Problem:   Primary localized osteoarthritis of right knee Active Problems:   Diabetic autonomic neuropathy [facility])   Past Medical History  Diagnosis Date  . Essential hypertension   . Hyperlipidemia   . Arthritis   . ED (erectile dysfunction)   . Sciatica   . PTSD (post-traumatic stress disorder)   . Shortness of breath dyspnea     with exertion  . Nephrolithiasis     30 % use of right kidney  . Renal insufficiency   . Primary localized osteoarthritis of right knee 02/26/2015  . 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 male who was admitted 02/26/2015 with a diagnosis of Primary localized osteoarthritis of right knee and went to the operating room on 02/26/2015 and underwent the above named procedures.    He was given perioperative antibiotics:  Anti-infectives    Start     Dose/Rate Route Frequency Ordered Stop   02/26/15 1500  ceFAZolin (ANCEF) IVPB 2 g/50 mL premix     2 g 100 mL/hr over 30 Minutes Intravenous Every 6 hours 02/26/15 1400 02/26/15 2148   02/26/15 0700  ceFAZolin (ANCEF) IVPB 2 g/50 mL premix     2 g 100 mL/hr over 30 Minutes Intravenous To ShortStay Surgical 02/25/15 1422 02/26/15 0738    .  He was given sequential compression devices, early ambulation, and xarelto for DVT prophylaxis.  He benefited maximally from the hospital stay and there were no complications.    Recent vital signs:  Filed Vitals:   02/27/15 0011 02/27/15 0410  BP: 138/61 117/57  Pulse: 73 65  Temp: 98.4 F (36.9  C) 98.2 F (36.8 C)  Resp: 16 18    Recent laboratory studies:  Lab Results  Component Value Date   HGB 13.3 02/15/2015   HGB 9.5* 07/05/2007   HGB 10.3* 07/03/2007   Lab Results  Component Value Date   WBC 9.7 02/15/2015   PLT 223 02/15/2015   Lab Results  Component Value Date   INR 1.2 06/29/2007   Lab Results  Component Value Date   NA 141 02/15/2015   K 4.4 02/15/2015   CL 106 02/15/2015   CO2 23 02/15/2015   BUN 20 02/15/2015   CREATININE 1.24 02/15/2015   GLUCOSE 129* 02/15/2015    Discharge Medications:     Medication List    STOP taking these medications        amoxicillin 500 MG capsule  Commonly known as:  AMOXIL      TAKE these medications        amLODipine 10 MG tablet  Commonly known as:  NORVASC  Take 1 tablet (10 mg total) by mouth daily.     aspirin 81 MG tablet  Take 81 mg by mouth daily.     baclofen 10 MG tablet  Commonly known as:  LIORESAL  Take 1 tablet (10 mg total) by mouth 3 (three) times daily. As needed for muscle spasm     diclofenac 75 MG EC tablet  Commonly known as:  VOLTAREN  Take 1 tablet (75  mg total) by mouth 2 (two) times daily.     fish oil-omega-3 fatty acids 1000 MG capsule  Take 1,000 mg by mouth 2 (two) times daily.     HYDROcodone-acetaminophen 10-325 MG tablet  Commonly known as:  NORCO  Take 1-2 tablets by mouth every 12 (twelve) hours.     hydrocortisone 25 MG suppository  Commonly known as:  ANUSOL-HC  Place 1 suppository (25 mg total) rectally at bedtime.     insulin aspart 100 UNIT/ML injection  Commonly known as:  novoLOG  Sliding scale with mealsSliding scale with meals     insulin glargine 100 UNIT/ML injection  Commonly known as:  LANTUS  Inject 0.25 mLs (25 Units total) into the skin at bedtime.     losartan 100 MG tablet  Commonly known as:  COZAAR  Take 1 tablet (100 mg total) by mouth daily.     metFORMIN 1000 MG tablet  Commonly known as:  GLUCOPHAGE  Take 1 tablet (1,000 mg  total) by mouth 2 (two) times daily with a meal.     multivitamins ther. w/minerals Tabs tablet  Take 1 tablet by mouth daily.     ondansetron 4 MG tablet  Commonly known as:  ZOFRAN  Take 1 tablet (4 mg total) by mouth every 8 (eight) hours as needed for nausea or vomiting.     pioglitazone 15 MG tablet  Commonly known as:  ACTOS  Take 1 tablet (15 mg total) by mouth daily.     prazosin 1 MG capsule  Commonly known as:  MINIPRESS  Take 1 mg by mouth. Take 2 tablets qhs     rivaroxaban 10 MG Tabs tablet  Commonly known as:  XARELTO  Take 1 tablet (10 mg total) by mouth daily.     sennosides-docusate sodium 8.6-50 MG tablet  Commonly known as:  SENOKOT-S  Take 2 tablets by mouth daily.     simvastatin 40 MG tablet  Commonly known as:  ZOCOR  Take 40 mg by mouth at bedtime.     traZODone 150 MG tablet  Commonly known as:  DESYREL  Take 150 mg by mouth. Take one third to one half by mouth qhs and may repeat one time        Diagnostic Studies: Dg Knee Right Port  02/26/2015  CLINICAL DATA:  Status post total right knee replacement. EXAM: PORTABLE RIGHT KNEE - 1-2 VIEW COMPARISON:  None. FINDINGS: There are immediate postoperative changes of total right knee arthroplasty. Expected locules of subcutaneous gas noted. Expected joint fluid is present. No fracture or hardware complication identified. IMPRESSION: Immediate postoperative changes of total right knee arthroplasty. No complicating features. Electronically Signed   By: Curlene Dolphin M.D.   On: 02/26/2015 11:28    Disposition:         Follow-up Information    Follow up with Johnny Bridge, MD. Schedule an appointment as soon as possible for a visit in 2 weeks.   Specialty:  Orthopedic Surgery   Contact information:   [place] [id number] [phone]        Signed: Johnny Bridge 02/27/2015, 8:15 AM

## 2015-02-27 NOTE — Discharge Instructions (Addendum)
INSTRUCTIONS AFTER JOINT REPLACEMENT  ° °o Remove items at home which could result in a fall. This includes throw rugs or furniture in walking pathways °o ICE to the affected joint every three hours while awake for 30 minutes at a time, for at least the first 3-5 days, and then as needed for pain and swelling.  Continue to use ice for pain and swelling. You may notice swelling that will progress down to the foot and ankle.  This is normal after surgery.  Elevate your leg when you are not up walking on it.   °o Continue to use the breathing machine you got in the hospital (incentive spirometer) which will help keep your temperature down.  It is common for your temperature to cycle up and down following surgery, especially at night when you are not up moving around and exerting yourself.  The breathing machine keeps your lungs expanded and your temperature down. ° ° °DIET:  As you were doing prior to hospitalization, we recommend a well-balanced diet. ° °DRESSING / WOUND CARE / SHOWERING ° °You may change your dressing 3-5 days after surgery.  Then change the dressing every day with sterile gauze.  Please use good hand washing techniques before changing the dressing.  Do not use any lotions or creams on the incision until instructed by your surgeon. ° °ACTIVITY ° °o Increase activity slowly as tolerated, but follow the weight bearing instructions below.   °o No driving for 6 weeks or until further direction given by your physician.  You cannot drive while taking narcotics.  °o No lifting or carrying greater than 10 lbs. until further directed by your surgeon. °o Avoid periods of inactivity such as sitting longer than an hour when not asleep. This helps prevent blood clots.  °o You may return to work once you are authorized by your doctor.  ° ° ° °WEIGHT BEARING  ° °Weight bearing as tolerated with assist device (walker, cane, etc) as directed, use it as long as suggested by your surgeon or therapist, typically at  least 4-6 weeks. ° ° °EXERCISES ° °Results after joint replacement surgery are often greatly improved when you follow the exercise, range of motion and muscle strengthening exercises prescribed by your doctor. Safety measures are also important to protect the joint from further injury. Any time any of these exercises cause you to have increased pain or swelling, decrease what you are doing until you are comfortable again and then slowly increase them. If you have problems or questions, call your caregiver or physical therapist for advice.  ° °Rehabilitation is important following a joint replacement. After just a few days of immobilization, the muscles of the leg can become weakened and shrink (atrophy).  These exercises are designed to build up the tone and strength of the thigh and leg muscles and to improve motion. Often times heat used for twenty to thirty minutes before working out will loosen up your tissues and help with improving the range of motion but do not use heat for the first two weeks following surgery (sometimes heat can increase post-operative swelling).  ° °These exercises can be done on a training (exercise) mat, on the floor, on a table or on a bed. Use whatever works the best and is most comfortable for you.    Use music or television while you are exercising so that the exercises are a pleasant break in your day. This will make your life better with the exercises acting as a break   in your routine that you can look forward to.   Perform all exercises about fifteen times, three times per day or as directed.  You should exercise both the operative leg and the other leg as well. ° °Exercises include: °  °• Quad Sets - Tighten up the muscle on the front of the thigh (Quad) and hold for 5-10 seconds.   °• Straight Leg Raises - With your knee straight (if you were given a brace, keep it on), lift the leg to 60 degrees, hold for 3 seconds, and slowly lower the leg.  Perform this exercise against  resistance later as your leg gets stronger.  °• Leg Slides: Lying on your back, slowly slide your foot toward your buttocks, bending your knee up off the floor (only go as far as is comfortable). Then slowly slide your foot back down until your leg is flat on the floor again.  °• Angel Wings: Lying on your back spread your legs to the side as far apart as you can without causing discomfort.  °• Hamstring Strength:  Lying on your back, push your heel against the floor with your leg straight by tightening up the muscles of your buttocks.  Repeat, but this time bend your knee to a comfortable angle, and push your heel against the floor.  You may put a pillow under the heel to make it more comfortable if necessary.  ° °A rehabilitation program following joint replacement surgery can speed recovery and prevent re-injury in the future due to weakened muscles. Contact your doctor or a physical therapist for more information on knee rehabilitation.  ° ° °CONSTIPATION ° °Constipation is defined medically as fewer than three stools per week and severe constipation as less than one stool per week.  Even if you have a regular bowel pattern at home, your normal regimen is likely to be disrupted due to multiple reasons following surgery.  Combination of anesthesia, postoperative narcotics, change in appetite and fluid intake all can affect your bowels.  ° °YOU MUST use at least one of the following options; they are listed in order of increasing strength to get the job done.  They are all available over the counter, and you may need to use some, POSSIBLY even all of these options:   ° °Drink plenty of fluids (prune juice may be helpful) and high fiber foods °Colace 100 mg by mouth twice a day  °Senokot for constipation as directed and as needed Dulcolax (bisacodyl), take with full glass of water  °Miralax (polyethylene glycol) once or twice a day as needed. ° °If you have tried all these things and are unable to have a bowel  movement in the first 3-4 days after surgery call either your surgeon or your primary doctor.   ° °If you experience loose stools or diarrhea, hold the medications until you stool forms back up.  If your symptoms do not get better within 1 week or if they get worse, check with your doctor.  If you experience "the worst abdominal pain ever" or develop nausea or vomiting, please contact the office immediately for further recommendations for treatment. ° ° °ITCHING:  If you experience itching with your medications, try taking only a single pain pill, or even half a pain pill at a time.  You can also use Benadryl over the counter for itching or also to help with sleep.  ° °TED HOSE STOCKINGS:  Use stockings on both legs until for at least 2 weeks or as   directed by physician office. They may be removed at night for sleeping. ° °MEDICATIONS:  See your medication summary on the “After Visit Summary” that nursing will review with you.  You may have some home medications which will be placed on hold until you complete the course of blood thinner medication.  It is important for you to complete the blood thinner medication as prescribed. ° °PRECAUTIONS:  If you experience chest pain or shortness of breath - call 911 immediately for transfer to the hospital emergency department.  ° °If you develop a fever greater that 101 F, purulent drainage from wound, increased redness or drainage from wound, foul odor from the wound/dressing, or calf pain - CONTACT YOUR SURGEON.   °                                                °FOLLOW-UP APPOINTMENTS:  If you do not already have a post-op appointment, please call the office for an appointment to be seen by your surgeon.  Guidelines for how soon to be seen are listed in your “After Visit Summary”, but are typically between 1-4 weeks after surgery. ° ° °MAKE SURE YOU:  °• Understand these instructions.  °• Get help right away if you are not doing well or get worse.  ° ° °Thank you for  letting us be a part of your medical care team.  It is a privilege we respect greatly.  We hope these instructions will help you stay on track for a fast and full recovery!  ° °Information on my medicine - XARELTO® (Rivaroxaban) ° °This medication education was reviewed with me or my healthcare representative as part of my discharge preparation.  The pharmacist that spoke with me during my hospital stay was:  Eeshan Verbrugge A Rawley Harju, RPH ° °Why was Xarelto® prescribed for you? °Xarelto® was prescribed for you to reduce the risk of blood clots forming after orthopedic surgery. The medical term for these abnormal blood clots is venous thromboembolism (VTE). ° °What do you need to know about xarelto® ? °Take your Xarelto® ONCE DAILY at the same time every day. °You may take it either with or without food. ° °If you have difficulty swallowing the tablet whole, you may crush it and mix in applesauce just prior to taking your dose. ° °Take Xarelto® exactly as prescribed by your doctor and DO NOT stop taking Xarelto® without talking to the doctor who prescribed the medication.  Stopping without other VTE prevention medication to take the place of Xarelto® may increase your risk of developing a clot. ° °After discharge, you should have regular check-up appointments with your healthcare provider that is prescribing your Xarelto®.   ° °What do you do if you miss a dose? °If you miss a dose, take it as soon as you remember on the same day then continue your regularly scheduled once daily regimen the next day. Do not take two doses of Xarelto® on the same day.  ° °Important Safety Information °A possible side effect of Xarelto® is bleeding. You should call your healthcare provider right away if you experience any of the following: °? Bleeding from an injury or your nose that does not stop. °? Unusual colored urine (red or dark brown) or unusual colored stools (red or black). °? Unusual bruising for unknown reasons. °? A serious fall  or if   you hit your head (even if there is no bleeding). ° °Some medicines may interact with Xarelto® and might increase your risk of bleeding while on Xarelto®. To help avoid this, consult your healthcare provider or pharmacist prior to using any new prescription or non-prescription medications, including herbals, vitamins, non-steroidal anti-inflammatory drugs (NSAIDs) and supplements. ° °This website has more information on Xarelto®: www.xarelto.com. ° ° ° ° °

## 2015-02-27 NOTE — Progress Notes (Signed)
     Subjective:  Patient reports pain as moderate.  Overall doing well, was able to get out of bed back and forth to the bathroom. Positive flatus.  Objective:   VITALS:   Filed Vitals:   02/26/15 1337 02/26/15 2055 02/27/15 0011 02/27/15 0410  BP: 154/75 137/64 138/61 117/57  Pulse: 79 83 73 65  Temp: 97.8 F (36.6 C) 97.8 F (36.6 C) 98.4 F (36.9 C) 98.2 F (36.8 C)  TempSrc: Oral Oral Oral Oral  Resp: 18 16 16 18   SpO2: 94% 96% 95% 95%    Neurologically intact Dorsiflexion/Plantar flexion intact Incision: dressing C/D/I   Lab Results  Component Value Date   WBC 9.7 02/15/2015   HGB 13.3 02/15/2015   HCT 41.3 02/15/2015   MCV 93.9 02/15/2015   PLT 223 02/15/2015   BMET    Component Value Date/Time   NA 141 02/15/2015 1204   NA 139 06/22/2014 0929   K 4.4 02/15/2015 1204   CL 106 02/15/2015 1204   CO2 23 02/15/2015 1204   GLUCOSE 129* 02/15/2015 1204   GLUCOSE 111* 06/22/2014 0929   BUN 20 02/15/2015 1204   BUN 25 06/22/2014 0929   CREATININE 1.24 02/15/2015 1204   CREATININE 1.03 10/02/2013 0717   CALCIUM 9.4 02/15/2015 1204   GFRNONAA 58* 02/15/2015 1204   GFRAA >60 02/15/2015 1204     Assessment/Plan: 1 Day Post-Op   Principal Problem:   Primary localized osteoarthritis of right knee Active Problems:   Osteoarthrosis, localized, primary, knee   Advance diet Up with therapy there is a potential that he might be able to be discharged home today if he is ambulatory, passes physical therapy and pain was controlled later this afternoon. Alternatively we can plan for tomorrow. Please call if he in fact is capable of being discharged, and we will put orders in. Otherwise it will be tomorrow.   Alesha Jaffee P 02/27/2015, 8:12 AM   Marchia Bond, MD Cell [phone]

## 2015-02-27 NOTE — Progress Notes (Addendum)
Physical Therapy Treatment Patient Details Name: Logan Hooper MRN: [id number] DOB: Mar 06, 1945 Today's Date: 02/27/2015    History of Present Illness 70 y.o. male admitted to [facility] on 02/26/15 for elective R TKA.  Pt with significant PMHx of essential HTN, sciatica, PTSD, SOB with exertion, nephrolithiasis, DM2 with peripherial neuropathy, back surgery, and bil RTC repair.     PT Comments    Pt is POD #1 and moving very well. He is supervision overall and walking down the hallway with RW.  We will review stairs in PM and pt may be able to d/c home after stair training.    Follow Up Recommendations  Home health PT;Supervision for mobility/OOB     Equipment Recommendations  None recommended by PT    Recommendations for Other Services  NA     Precautions / Restrictions Precautions Precautions: Knee Precaution Booklet Issued: Yes (comment) Precaution Comments: reviewed not placing pillow, ice pack or other object underneath knee Required Braces or Orthoses: Knee Immobilizer - Right Restrictions Weight Bearing Restrictions: Yes RLE Weight Bearing: Weight bearing as tolerated    Mobility                Transfers Overall transfer level: Needs assistance Equipment used: Rolling walker (2 wheeled) Transfers: Sit to/from Stand Sit to Stand: Supervision         General transfer comment: supervision for safety, verbal cues for safe hand placement and leg positioning during transitions.   Ambulation/Gait Ambulation/Gait assistance: Supervision Ambulation Distance (Feet): 200 Feet Assistive device: Rolling walker (2 wheeled) Gait Pattern/deviations: Step-through pattern;Antalgic;Trunk flexed Gait velocity: decreased Gait velocity interpretation: Below normal speed for age/gender General Gait Details: Verbal cues for upright posture and good heel to toe gait pattern.  Pt needed supervision for safety          Balance Overall balance assessment: Needs  assistance Sitting-balance support: Feet supported;No upper extremity supported Sitting balance-Leahy Scale: Good     Standing balance support: Single extremity supported;Bilateral upper extremity supported;No upper extremity supported Standing balance-Leahy Scale: Fair Standing balance comment: Able to maintain balance without UE support for brief period of time while completing grooming tasks                    Cognition Arousal/Alertness: Awake/alert Behavior During Therapy: WFL for tasks assessed/performed Overall Cognitive Status: Within Functional Limits for tasks assessed                      Exercises Total Joint Exercises Ankle Circles/Pumps: AROM;Both;20 reps Quad Sets: AROM;Right;10 reps Towel Squeeze: AROM;Both;10 reps Heel Slides: AAROM;Right;10 reps        Pertinent Vitals/Pain Pain Assessment: 0-10 Pain Score: 3  Pain Location: right knee with movement. Pain Descriptors / Indicators: Aching;Burning Pain Intervention(s): Limited activity within patient's tolerance;Monitored during session;Repositioned    Home Living Family/patient expects to be discharged to:: Private residence Living Arrangements: Spouse/significant other Available Help at Discharge: Family;Available 24 hours/day Type of Home: House Home Access: Stairs to enter Entrance Stairs-Rails: None Home Layout: Laundry or work area in basement;One level Home Equipment: [place] - 2 wheels;Cane - single point;Bedside commode;Shower seat - built Investment banker, operational      Prior Function Level of Independence: Independent      Comments: retired   PT Goals (current goals can now be found in the care plan section) Acute Rehab PT Goals Patient Stated Goal: to get back to playing golf Progress towards PT goals: Progressing toward goals  Frequency  7X/week    PT Plan Current plan remains appropriate       End of Session Equipment Utilized During Treatment: Right knee  immobilizer Activity Tolerance: Patient tolerated treatment well Patient left: in chair;with call bell/phone within reach;with family/visitor present     Time: [id number] PT Time Calculation (min) (ACUTE ONLY): 21 min  Charges:  $Gait Training: 8-22 mins                      Monicka Cyran B. [place], [place], DPT [phone]   02/27/2015, 3:56 PM

## 2015-02-27 NOTE — Progress Notes (Signed)
Physical Therapy Treatment Patient Details Name: Logan Hooper MRN: [id number] DOB: 02/15/1945 Today's Date: 02/27/2015    History of Present Illness 70 y.o. male admitted to [facility] on 02/26/15 for elective R TKA.  Pt with significant PMHx of essential HTN, sciatica, PTSD, SOB with exertion, nephrolithiasis, DM2 with peripherial neuropathy, back surgery, and bil RTC repair.     PT Comments    Pt is POD #1 second session and was able to demonstrate safety simulating home entry with min assist. Exercise packet reviewed as well as knee precaution.  Pt plans to d/c home later today.    Follow Up Recommendations  Home health PT;Supervision for mobility/OOB     Equipment Recommendations  None recommended by PT    Recommendations for Other Services   NA     Precautions / Restrictions Precautions Precautions: Knee Precaution Booklet Issued: Yes (comment) Precaution Comments: reviewed no pillow under operative knee Required Braces or Orthoses:  (did not use in PM session without difficulty) Restrictions RLE Weight Bearing: Weight bearing as tolerated    Mobility                    Transfers Overall transfer level: Needs assistance Equipment used: Rolling walker (2 wheeled) Transfers: Sit to/from Stand Sit to Stand: Supervision         General transfer comment: supervision for safety, verbal cues for safe hand placement and leg positioning during transitions. Uncontrolled "crash" descent to sit.  Cues to remind him to reach back to chair to help control descent when going to sit.   Ambulation/Gait Ambulation/Gait assistance: Supervision Ambulation Distance (Feet): 210 Feet Assistive device: Rolling walker (2 wheeled) Gait Pattern/deviations: Step-through pattern;Ataxic;Trunk flexed Gait velocity: decreased Gait velocity interpretation: Below normal speed for age/gender General Gait Details: Verbal cues for upright posture and good heel to toe gait pattern.  Attempted to  get pt to bend knee during swing phase instead of circumducting stiff straight leg.   Pt needed supervision for safety   Stairs Stairs: Yes Stairs assistance: Min assist Stair Management: No rails;Step to pattern;Backwards;With walker Number of Stairs: 2 General stair comments: Practiced going backwards up stairs with RW. Reinforced correct LE sequencing and educated wife when back in room so that she would know the correct pattern.  Pt also practiced a curb step forward with RW min guard assist for safety with this technique.  Pt is not sure which method he will use at home or which entry he will decide to go in/out once he arrives.         Balance Overall balance assessment: Needs assistance Sitting-balance support: Feet supported;No upper extremity supported Sitting balance-Leahy Scale: Good     Standing balance support: No upper extremity supported;Bilateral upper extremity supported;Single extremity supported Standing balance-Leahy Scale: Fair                      Cognition Arousal/Alertness: Awake/alert Behavior During Therapy: WFL for tasks assessed/performed Overall Cognitive Status: Within Functional Limits for tasks assessed                      Exercises Total Joint Exercises Short Arc Quad: AROM;Right;10 reps Hip ABduction/ADduction: AROM;Right;10 reps Straight Leg Raises: AROM;Right;10 reps Long Arc Quad: AROM;Right;10 reps Knee Flexion: AAROM;AROM;Right;10 reps Goniometric ROM: 15-95        Pertinent Vitals/Pain Pain Assessment: 0-10 Pain Score: 5  Pain Location: right knee Pain Descriptors / Indicators: Sore Pain Intervention(s): Limited activity within  patient's tolerance;Monitored during session;Repositioned;Ice applied           PT Goals (current goals can now be found in the care plan section) Acute Rehab PT Goals Patient Stated Goal: to get back to playing golf Progress towards PT goals: Progressing toward goals     Frequency  7X/week    PT Plan Current plan remains appropriate       End of Session Equipment Utilized During Treatment: Right knee immobilizer Activity Tolerance: Patient tolerated treatment well Patient left: in chair;with call bell/phone within reach;with family/visitor present     Time: [id number] PT Time Calculation (min) (ACUTE ONLY): 30 min  Charges:  $Gait Training: 8-22 mins $Therapeutic Exercise: 8-22 mins                      Lonzell Dorris B. [place], [place], DPT [phone]   02/27/2015, 4:04 PM

## 2015-02-27 NOTE — Care Management Note (Signed)
Case Management Note  Patient Details  Name: Logan Hooper MRN: [id number] Date of Birth: 1945/12/03  Subjective/Objective:  70 yr old gentleman s/p right total knee arthroplasty.                  Action/Plan:  Case manager spoke with patient and wife at the bedside concerning home health and DME needs. Choice was offered. Patient was preoperatively setup with [place], no changes. Patient states he has rolling walker, 3in1 at home. He will have family support at discharge.  Expected Discharge Date:   02/27/15               Expected Discharge Plan:   Home with Home Health  In-House Referral:  NA  Discharge planning Services  CM Consult  Post Acute Care Choice:  Home Health Choice offered to:  Patient, Spouse  DME Arranged:  N/A DME Agency:  NA  HH Arranged:  PT [place] Agency:  [place]  Status of Service:  Completed, signed off  Medicare Important Message Given:    Date Medicare IM Given:    Medicare IM give by:    Date Additional Medicare IM Given:    Additional Medicare Important Message give by:     If discussed at [place] of Stay Meetings, dates discussed:    Additional Comments:  Ninfa Meeker, RN 02/27/2015, 10:43 AM

## 2015-02-27 NOTE — Progress Notes (Signed)
Pt discharged to home with wife via wheelchair without incident. Both pt and wife verbalize understanding of discharge teachings and agree to comply. No change from AM assessment.

## 2015-02-27 NOTE — Progress Notes (Signed)
Occupational Therapy Evaluation/Discharge Patient Details Name: Logan Hooper MRN: [id number] DOB: 08-21-1945 Today's Date: 02/27/2015    History of Present Illness 70 y.o. male admitted to [facility] on 02/26/15 for elective R TKA.  Pt with significant PMHx of essential HTN, sciatica, PTSD, SOB with exertion, nephrolithiasis, DM2 with peripherial neuropathy, back surgery, and bil RTC repair.    Clinical Impression   PTA, pt was independent with ADLs and mobility. Pt currently requires supervision for ADLs and functional transfers. Educated pt on compensatory strategies for ADLs, pain/edema management, energy conservation, and fall prevention strategies. Pt plans to d/c home with 24/7 assistance from his wife and daughter. All education has been completed and pt/family have no further questions. Pt has no further acute OT needs. OT signing off.    Follow Up Recommendations  No OT follow up;Supervision - Intermittent    Equipment Recommendations  None recommended by OT    Recommendations for Other Services       Precautions / Restrictions Precautions Precautions: Knee Precaution Booklet Issued: Yes (comment) Precaution Comments: reviewed not placing pillow, ice pack or other object underneath knee Required Braces or Orthoses: Knee Immobilizer - Right Restrictions Weight Bearing Restrictions: Yes RLE Weight Bearing: Weight bearing as tolerated      Mobility Bed Mobility               General bed mobility comments: Pt was up in chair on OT arrival  Transfers Overall transfer level: Needs assistance Equipment used: Rolling walker (2 wheeled) Transfers: Sit to/from Stand Sit to Stand: Supervision         General transfer comment: Supervision for safety - verbal cues for safe hand placement. No physical assist required, no LOB or reports of dizziness    Balance Overall balance assessment: Needs assistance Sitting-balance support: No upper extremity supported;Feet  supported Sitting balance-Leahy Scale: Good     Standing balance support: No upper extremity supported;During functional activity Standing balance-Leahy Scale: Fair Standing balance comment: Able to maintain balance without UE support for brief period of time while completing grooming tasks                            ADL Overall ADL's : Needs assistance/impaired     Grooming: Wash/dry hands;Supervision/safety;Standing           Upper Body Dressing : Supervision/safety;Sitting   Lower Body Dressing: Supervision/safety;Cueing for compensatory techniques;Sit to/from stand Lower Body Dressing Details (indicate cue type and reason): Cues to dress RLE first and undress it last Toilet Transfer: Supervision/safety;Cueing for safety;Ambulation;BSC;RW Toilet Transfer Details (indicate cue type and reason): BSC over toilet, cues to feel BSC on back of legs before reaching back to sit Toileting- Clothing Manipulation and Hygiene: Supervision/safety;Sit to/from stand   Tub/ Shower Transfer: Walk-in shower;Supervision/safety;Cueing for sequencing;Ambulation;Rolling walker Tub/Shower Transfer Details (indicate cue type and reason): Cues for proper step sequence with RW Functional mobility during ADLs: Supervision/safety;Rolling walker General ADL Comments: Reviewed knee precautions, use of KI/foam to elevate, compensatory strategies for ADLs, pain/edema management strategies, fall prevention, and energy conservation strategies.     Vision Vision Assessment?: No apparent visual deficits   Perception     Praxis      Pertinent Vitals/Pain Pain Assessment: 0-10 Pain Score: 2  Pain Location: R knee Pain Descriptors / Indicators: Burning;Aching Pain Intervention(s): Limited activity within patient's tolerance;Monitored during session;Repositioned;Ice applied     Hand Dominance Right   Extremity/Trunk Assessment Upper Extremity Assessment Upper Extremity Assessment:  Overall  WFL for tasks assessed   Lower Extremity Assessment Lower Extremity Assessment: RLE deficits/detail RLE Deficits / Details: decreased ROM and strength as expected post op   Cervical / Trunk Assessment Cervical / Trunk Assessment: Normal   Communication Communication Communication: No difficulties   Cognition Arousal/Alertness: Awake/alert Behavior During Therapy: WFL for tasks assessed/performed Overall Cognitive Status: Within Functional Limits for tasks assessed                     General Comments       Exercises       Shoulder Instructions      Home Living Family/patient expects to be discharged to:: Private residence Living Arrangements: Spouse/significant other Available Help at Discharge: Family;Available 24 hours/day Type of Home: House Home Access: Stairs to enter [employer] of Steps: 2 Entrance Stairs-Rails: None Home Layout: Laundry or work area in basement;One level     Bathroom Shower/Tub: Walk-in shower;Door   [employer] Toilet: Handicapped height Bathroom Accessibility: Yes How Accessible: Accessible via walker Home Equipment: [place] - 2 wheels;Cane - single point;Bedside commode;Shower seat - built Hotel manager: [place]        Prior Functioning/Environment Level of Independence: Independent        Comments: retired    OT Diagnosis: Acute pain   OT Problem List: Decreased strength;Decreased range of motion;Impaired balance (sitting and/or standing);Decreased activity tolerance;Decreased coordination;Decreased safety awareness;Decreased knowledge of use of DME or AE;Decreased knowledge of precautions;Obesity;Pain   OT Treatment/Interventions:      OT Goals(Current goals can be found in the care plan section) Acute Rehab OT Goals Patient Stated Goal: to get back to playing golf OT Goal Formulation: With patient Time For Goal Achievement: 03/13/15 Potential to Achieve Goals: Good  OT Frequency:      Barriers to D/C:            Co-evaluation              End of Session Equipment Utilized During Treatment: Gait belt;Rolling walker;Right knee immobilizer Nurse Communication: Mobility status  Activity Tolerance: Patient tolerated treatment well Patient left: in chair;with call bell/phone within reach;with family/visitor present   Time: [id number] OT Time Calculation (min): 28 min Charges:  OT General Charges $OT Visit: 1 Procedure OT Evaluation $OT Eval Moderate Complexity: 1 Procedure OT Treatments $Self Care/Home Management : 8-22 mins G-Codes:    Redmond Baseman, OTR/L Pager: [phone] 02/27/2015, 12:34 PM

## 2015-02-28 ENCOUNTER — Other Ambulatory Visit: Payer: Self-pay | Admitting: Orthopedic Surgery

## 2015-02-28 DIAGNOSIS — E1140 Type 2 diabetes mellitus with diabetic neuropathy, unspecified: Secondary | ICD-10-CM | POA: Diagnosis not present

## 2015-02-28 DIAGNOSIS — M1711 Unilateral primary osteoarthritis, right knee: Secondary | ICD-10-CM

## 2015-02-28 DIAGNOSIS — Z96651 Presence of right artificial knee joint: Secondary | ICD-10-CM | POA: Diagnosis not present

## 2015-02-28 DIAGNOSIS — F4310 Post-traumatic stress disorder, unspecified: Secondary | ICD-10-CM | POA: Diagnosis not present

## 2015-02-28 DIAGNOSIS — I10 Essential (primary) hypertension: Secondary | ICD-10-CM | POA: Diagnosis not present

## 2015-02-28 DIAGNOSIS — F329 Major depressive disorder, single episode, unspecified: Secondary | ICD-10-CM | POA: Diagnosis not present

## 2015-02-28 DIAGNOSIS — E785 Hyperlipidemia, unspecified: Secondary | ICD-10-CM | POA: Diagnosis not present

## 2015-02-28 DIAGNOSIS — Z471 Aftercare following joint replacement surgery: Secondary | ICD-10-CM | POA: Diagnosis not present

## 2015-02-28 DIAGNOSIS — Z794 Long term (current) use of insulin: Secondary | ICD-10-CM | POA: Diagnosis not present

## 2015-03-01 DIAGNOSIS — F4310 Post-traumatic stress disorder, unspecified: Secondary | ICD-10-CM | POA: Diagnosis not present

## 2015-03-01 DIAGNOSIS — Z471 Aftercare following joint replacement surgery: Secondary | ICD-10-CM | POA: Diagnosis not present

## 2015-03-01 DIAGNOSIS — I10 Essential (primary) hypertension: Secondary | ICD-10-CM | POA: Diagnosis not present

## 2015-03-01 DIAGNOSIS — E785 Hyperlipidemia, unspecified: Secondary | ICD-10-CM | POA: Diagnosis not present

## 2015-03-01 DIAGNOSIS — Z96651 Presence of right artificial knee joint: Secondary | ICD-10-CM | POA: Diagnosis not present

## 2015-03-01 DIAGNOSIS — E1140 Type 2 diabetes mellitus with diabetic neuropathy, unspecified: Secondary | ICD-10-CM | POA: Diagnosis not present

## 2015-03-04 DIAGNOSIS — E1140 Type 2 diabetes mellitus with diabetic neuropathy, unspecified: Secondary | ICD-10-CM | POA: Diagnosis not present

## 2015-03-04 DIAGNOSIS — E785 Hyperlipidemia, unspecified: Secondary | ICD-10-CM | POA: Diagnosis not present

## 2015-03-04 DIAGNOSIS — Z471 Aftercare following joint replacement surgery: Secondary | ICD-10-CM | POA: Diagnosis not present

## 2015-03-04 DIAGNOSIS — Z96651 Presence of right artificial knee joint: Secondary | ICD-10-CM | POA: Diagnosis not present

## 2015-03-04 DIAGNOSIS — I10 Essential (primary) hypertension: Secondary | ICD-10-CM | POA: Diagnosis not present

## 2015-03-04 DIAGNOSIS — F4310 Post-traumatic stress disorder, unspecified: Secondary | ICD-10-CM | POA: Diagnosis not present

## 2015-03-06 DIAGNOSIS — F4310 Post-traumatic stress disorder, unspecified: Secondary | ICD-10-CM | POA: Diagnosis not present

## 2015-03-06 DIAGNOSIS — I10 Essential (primary) hypertension: Secondary | ICD-10-CM | POA: Diagnosis not present

## 2015-03-06 DIAGNOSIS — E785 Hyperlipidemia, unspecified: Secondary | ICD-10-CM | POA: Diagnosis not present

## 2015-03-06 DIAGNOSIS — Z96651 Presence of right artificial knee joint: Secondary | ICD-10-CM | POA: Diagnosis not present

## 2015-03-06 DIAGNOSIS — E1140 Type 2 diabetes mellitus with diabetic neuropathy, unspecified: Secondary | ICD-10-CM | POA: Diagnosis not present

## 2015-03-06 DIAGNOSIS — Z471 Aftercare following joint replacement surgery: Secondary | ICD-10-CM | POA: Diagnosis not present

## 2015-03-08 DIAGNOSIS — F4310 Post-traumatic stress disorder, unspecified: Secondary | ICD-10-CM | POA: Diagnosis not present

## 2015-03-08 DIAGNOSIS — E1140 Type 2 diabetes mellitus with diabetic neuropathy, unspecified: Secondary | ICD-10-CM | POA: Diagnosis not present

## 2015-03-08 DIAGNOSIS — Z471 Aftercare following joint replacement surgery: Secondary | ICD-10-CM | POA: Diagnosis not present

## 2015-03-08 DIAGNOSIS — I10 Essential (primary) hypertension: Secondary | ICD-10-CM | POA: Diagnosis not present

## 2015-03-08 DIAGNOSIS — Z96651 Presence of right artificial knee joint: Secondary | ICD-10-CM | POA: Diagnosis not present

## 2015-03-08 DIAGNOSIS — E785 Hyperlipidemia, unspecified: Secondary | ICD-10-CM | POA: Diagnosis not present

## 2015-03-11 DIAGNOSIS — Z471 Aftercare following joint replacement surgery: Secondary | ICD-10-CM | POA: Diagnosis not present

## 2015-03-11 DIAGNOSIS — E785 Hyperlipidemia, unspecified: Secondary | ICD-10-CM | POA: Diagnosis not present

## 2015-03-11 DIAGNOSIS — Z96651 Presence of right artificial knee joint: Secondary | ICD-10-CM | POA: Diagnosis not present

## 2015-03-11 DIAGNOSIS — F4310 Post-traumatic stress disorder, unspecified: Secondary | ICD-10-CM | POA: Diagnosis not present

## 2015-03-11 DIAGNOSIS — E1140 Type 2 diabetes mellitus with diabetic neuropathy, unspecified: Secondary | ICD-10-CM | POA: Diagnosis not present

## 2015-03-11 DIAGNOSIS — I10 Essential (primary) hypertension: Secondary | ICD-10-CM | POA: Diagnosis not present

## 2015-03-13 DIAGNOSIS — M1711 Unilateral primary osteoarthritis, right knee: Secondary | ICD-10-CM | POA: Diagnosis not present

## 2015-03-14 ENCOUNTER — Ambulatory Visit (HOSPITAL_COMMUNITY): Payer: Medicare Other | Attending: Orthopedic Surgery | Admitting: Physical Therapy

## 2015-03-14 DIAGNOSIS — R269 Unspecified abnormalities of gait and mobility: Secondary | ICD-10-CM | POA: Diagnosis not present

## 2015-03-14 DIAGNOSIS — R2689 Other abnormalities of gait and mobility: Secondary | ICD-10-CM | POA: Diagnosis not present

## 2015-03-14 DIAGNOSIS — M6281 Muscle weakness (generalized): Secondary | ICD-10-CM | POA: Insufficient documentation

## 2015-03-14 DIAGNOSIS — M25662 Stiffness of left knee, not elsewhere classified: Secondary | ICD-10-CM

## 2015-03-14 DIAGNOSIS — R262 Difficulty in walking, not elsewhere classified: Secondary | ICD-10-CM | POA: Diagnosis not present

## 2015-03-14 NOTE — Therapy (Signed)
[facility] [address] [place], [place], [address] Phone: [phone]   Fax:  [phone]  Physical Therapy Evaluation  Patient Details  Name: Logan Hooper MRN: [id number] Date of Birth: March 31, 1945 Referring Provider: Marchia Bond  Encounter Date: 03/14/2015      PT End of Session - 03/14/15 0841    Visit Number 1   Number of Visits 24   Date for PT Re-Evaluation 04/13/15   Authorization Type medicare   Authorization - Visit Number 1   Authorization - Number of Visits 10   PT Start Time 0805   PT Stop Time 0845   PT Time Calculation (min) 40 min      Past Medical History  Diagnosis Date  . Essential hypertension   . Hyperlipidemia   . Arthritis   . ED (erectile dysfunction)   . Sciatica   . PTSD (post-traumatic stress disorder)   . Shortness of breath dyspnea     with exertion  . Nephrolithiasis     30 % use of right kidney  . Renal insufficiency   . Primary localized osteoarthritis of right knee 02/26/2015  . 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 Back surgery    . Video assisted thoracoscopy (vats)/decortication Left 2009    Dr. Arlyce Dice  -left-sided empyema  . Colonoscopy    . Rotator cuff repair      BILATERAL  . Removal of kidney  stones    . 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 Total knee arthroplasty Right 02/26/2015    Procedure: TOTAL KNEE ARTHROPLASTY;  Surgeon: Marchia Bond, MD;  Location: [facility];  Service: Orthopedics;  Laterality: Right;    There were no vitals filed for this visit.  Visit Diagnosis:  Abnormality of gait  Stiffness of knee joint, left  Unstable balance  Difficulty walking down step      Subjective Assessment - 03/14/15 0805    Subjective Logan Hooper states that he had a right total knee replacement on 02/26/2015 he was released to home health therapy on 02/27/2015.  He is  now being referred to outpatient physical therapy.  At this time he states that his main concern is that he is still having swelling and tightness.  He is able to do most everything he wants to do.  He is going up and down steps one at a time.  He has not played golf in years due to rotator cuff surgeries and his knees but if he is able to he would love to be able to play golf again.  He states that he is going to have his Left knee replaced in the near future.     Pertinent History 2 previous back surgeries    How long can you sit comfortably? 10-15  minutes without having to straighten his leg out    How long can you stand comfortably? 10 minutes    How long can you walk comfortably? 15 minutes    Patient Stated Goals Pt is ambulating without any assistive device; he wants his pain to be lower and improved motion    Currently in Pain? Yes   Pain Score 2   greatest pain is a 5-6    Pain Location Knee   Pain Orientation Right   Pain Descriptors / Indicators Aching;Tightness   Pain Type Surgical pain   Pain Onset 1  to 4 weeks ago   Pain Frequency Constant  varies in intensitiy   Aggravating Factors  activity    Pain Relieving Factors ice/ elevation    Effect of Pain on Daily Activities increases             [facility] PT Assessment - 03/14/15 0816    Assessment   Medical Diagnosis Rt total knee replacement   Referring Provider Marchia Bond   Onset Date/Surgical Date 02/26/15   Next MD Visit 04/10/2015   Prior Therapy home health   Precautions   Precautions None   Restrictions   Weight Bearing Restrictions No   Balance Screen   Has the patient fallen in the past 6 months No   Has the patient had a decrease in activity level because of a fear of falling?  Yes   Is the patient reluctant to leave their home because of a fear of falling?  No   Home Environment   Living Environment Private residence   Type of [place] to enter   Entrance Stairs-Number of Steps 3    Home Layout Laundry or work area in basement   Prior Function   Level of [place] Retired   Leisure walking, going to [employer] has [place] with 21 steps, race car events    Cognition   Overall Cognitive Status Within Functional Limits for tasks assessed   Observation/Other Assessments   Focus on Therapeutic Outcomes (FOTO)  68   Functional Tests   Functional tests Single leg stance;Sit to Stand   Single Leg Stance   Comments Both LE 3 seconds a piece    Sit to Stand   Comments 10.2    ROM / Strength   AROM / PROM / Strength AROM;Strength   AROM   AROM Assessment Site Knee   Right/Left Knee Right   Right Knee Extension 23   Right Knee Flexion 105   Strength   Strength Assessment Site Hip;Knee;Ankle   Right/Left Hip Right   Right Hip Flexion 5/5   Right Hip Extension 4/5   Right Hip ABduction 4/5   Right/Left Knee Right   Right Knee Extension 5/5   Right/Left Ankle Right   Right Ankle Dorsiflexion 5/5                   [facility] Adult PT Treatment/Exercise - 03/14/15 0001    Exercises   Exercises Knee/Hip   Knee/Hip Exercises: Stretches   Active Hamstring Stretch 3 reps;30 seconds   Knee/Hip Exercises: Standing   SLS x 3 Both LE 3" max    Knee/Hip Exercises: Supine   Quad Sets 10 reps   Knee Extension PROM                PT Education - 03/14/15 [id number]    Education provided Yes   Education Details HEP   Person(s) Educated Patient   Methods Explanation;Handout;Verbal cues;Tactile cues   Comprehension Verbalized understanding;Returned demonstration          PT Short Term Goals - 03/14/15 1255    PT SHORT TERM GOAL #1   Title Pt ROM to improve to 10 to 115 degress to allow improve tolerance of sitting for up to 60 minutes for traveling to the beach    Time 4   Period Weeks   Status New   PT SHORT TERM GOAL #2   Title Pt single leg stance to be at least 10 seconds  to reduce risk of falling    Time 4   Period Weeks    PT SHORT TERM GOAL #3   Title Pt pain to be no greater than a 2/10 to be able to tolerate weightbearing activity for an hour.    Time 4   Period Weeks   PT SHORT TERM GOAL #4   Title Pt to be independent with a home exercise program to improve ROM    Time 4   Period Weeks           PT Long Term Goals - 2015-03-20 1258    PT LONG TERM GOAL #1   Title Pt ROM to be improved  to 5 to 120 degrees to allow pt  ambulate with a normalized gait pattern with equal stride length.   Time 8   Period Weeks   Status New   PT LONG TERM GOAL #2   Title Pt single leg stance to be improved to 15 seconds to reduce risk of falls .   Time 8   Period Weeks   PT LONG TERM GOAL #3   Title Pt to be able to demonstrate the ability to go up and down 20 steps to be able to transverse steps at his beach house.    Time 8   Period Weeks   Status New   PT LONG TERM GOAL #4   Title Pt to report that he is able to ambulate cofidently over grass and is  working  in his yard.      Time 8   Period Weeks   Status New               Plan - Mar 20, 2015 1248    Clinical Impression Statement Pt is a 70 year old male who has had a recently had a right total knee replacement.  He comes with orders for skilled outpatient physical therapy.  Evaluation demonstrates decreased activity tolerance, increased swelling, decreased balance, decreased balance and decrased ROM.  Logan Hooper will benefit from skilled PT to address these issues and maximize his functional ability.     Pt will benefit from skilled therapeutic intervention in order to improve on the following deficits Abnormal gait;Decreased activity tolerance;Decreased balance;Decreased range of motion;Decreased strength;Difficulty walking;Pain;Increased edema   Rehab Potential Good   PT Frequency 3x / week   PT Duration 8 weeks   PT Treatment/Interventions Gait training;Stair training;Therapeutic activities;Electrical Stimulation;Cryotherapy;Therapeutic  exercise;Balance training;Patient/family education;Manual techniques   PT Next Visit Plan begin heel raises, terminal extension in both standing and supine postion, foward and lateral step ups.  And manual technques to decrease swelling.    Consulted and Agree with Plan of Care Patient          G-Codes - 20-Mar-2015 1306    Functional Assessment Tool Used walking and movint around    Functional Limitation Mobility: Walking and moving around   Mobility: Walking and Moving Around Current Status [phone]) At least 20 percent but less than 40 percent impaired, limited or restricted   Mobility: Walking and Moving Around Goal Status [phone]) At least 1 percent but less than 20 percent impaired, limited or restricted       Problem List Patient Active Problem List   Diagnosis Date Noted  . Primary localized osteoarthritis of right knee 02/26/2015  . Fecal incontinence 09/24/2014  . Precordial pain 01/18/2014  . Adhesive capsulitis of shoulder 06/15/2013  . Decreased range of motion of right shoulder 05/19/2013  . Muscle weakness (generalized)  05/19/2013  . Diabetic autonomic neuropathy ([place]) 05/20/2012  . Other and unspecified hyperlipidemia 05/20/2012  . Insomnia 05/20/2012  . Essential hypertension, benign 05/20/2012   Rayetta Humphrey, PT CLT [phone] 03/14/2015, 1:14 PM  [facility] [address] [place], [place], [address] Phone: [phone]   Fax:  [phone]  Name: Logan Hooper MRN: [id number] Date of Birth: 1945/04/02

## 2015-03-14 NOTE — Patient Instructions (Addendum)
Strengthening: Quadriceps Set    Tighten muscles on top of thighs by pushing knees down into surface. Hold _5___ seconds. Repeat _10___ times per set. Do __1__ sets per session. Do ___3_ sessions per day.  http://orth.exer.us/602   Copyright  VHI. All rights reserved.  Stretching: Hamstring (Supine)    Supporting right thigh behind knee, slowly straighten knee until stretch is felt in back of thigh. Hold __30__ seconds. Repeat __3__ times per set. Do __1__ sets per session. Do __3__ sessions per day.  http://orth.exer.us/656   Copyright  VHI. All rights reserved.  Knee Extension Mobilization: Towel Prop    With rolled towel under right ankle, place __3-5__ pound weight across knee. Hold __5-30__ minutes. Repeat ___1_ times per set. Do __1__ sets per session. Do __3__ sessions per day.  http://orth.exer.us/720   Copyright  VHI. All rights reserved.  Self-Mobilization: Heel Slide (Supine)    Slide right heel toward buttocks until a gentle stretch is felt. Hold __5__ seconds. Relax. Repeat _20___ times per set. Do ____ sets per session. Do _2___ sessions per day. 1 http://orth.exer.us/710   Copyright  VHI. All rights reserved.  Heel Raise: Bilateral (Standing)    Rise on balls of feet. Repeat _10___ times per set. Do _1___ sets per session. Do __2__ sessions per day.  http://orth.exer.us/38   Copyright  VHI. All rights reserved.  Strengthening: Hip Abduction (Side-Lying)    Tighten muscles on front of right  thigh, then lift leg _10___ inches from surface, keeping knee locked.  Repeat _10___ times per set. Do _1___ sets per session. Do __3 __ sessions per day.  http://orth.exer.us/622   Copyright  VHI. All rights reserved.  Bridging    Slowly raise buttocks from floor, keeping stomach tight. Repeat _10___ times per set. Do __1__ sets per session. Do __2__ sessions per day.  http://orth.exer.us/1096   Copyright  VHI. All rights reserved.

## 2015-03-18 ENCOUNTER — Ambulatory Visit (HOSPITAL_COMMUNITY): Payer: Medicare Other | Admitting: Physical Therapy

## 2015-03-18 ENCOUNTER — Encounter: Payer: Self-pay | Admitting: *Deleted

## 2015-03-18 DIAGNOSIS — R262 Difficulty in walking, not elsewhere classified: Secondary | ICD-10-CM | POA: Diagnosis not present

## 2015-03-18 DIAGNOSIS — M25662 Stiffness of left knee, not elsewhere classified: Secondary | ICD-10-CM

## 2015-03-18 DIAGNOSIS — R269 Unspecified abnormalities of gait and mobility: Secondary | ICD-10-CM | POA: Diagnosis not present

## 2015-03-18 DIAGNOSIS — M6281 Muscle weakness (generalized): Secondary | ICD-10-CM | POA: Diagnosis not present

## 2015-03-18 DIAGNOSIS — R2689 Other abnormalities of gait and mobility: Secondary | ICD-10-CM | POA: Diagnosis not present

## 2015-03-18 NOTE — Therapy (Signed)
[facility] [place], [place], [address] Phone: [phone]   Fax:  [phone]  Physical Therapy Treatment  Patient Details  Name: Logan Hooper MRN: [id number] Date of Birth: 28-Mar-1945 Referring Provider: Marchia Bond  Encounter Date: 03/18/2015      PT End of Session - 03/18/15 1131    Visit Number 2   Number of Visits 24   Date for PT Re-Evaluation 04/13/15   Authorization Type medicare   Authorization - Visit Number 2   Authorization - Number of Visits 10   PT Start Time [id number]   PT Stop Time 1145   PT Time Calculation (min) 42 min   Activity Tolerance Patient tolerated treatment well      Past Medical History  Diagnosis Date  . Essential hypertension   . Hyperlipidemia   . Arthritis   . ED (erectile dysfunction)   . Sciatica   . PTSD (post-traumatic stress disorder)   . Shortness of breath dyspnea     with exertion  . Nephrolithiasis     30 % use of right kidney  . Renal insufficiency   . Primary localized osteoarthritis of right knee 02/26/2015  . 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 Back surgery    . Video assisted thoracoscopy (vats)/decortication Left 2009    Dr. Arlyce Dice  -left-sided empyema  . Colonoscopy    . Rotator cuff repair      BILATERAL  . Removal of kidney  stones    . 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  Visit Diagnosis:  Abnormality of gait  Stiffness of knee joint, left  Unstable balance  Difficulty walking down step      Subjective Assessment - 03/18/15 1101    Subjective Pt states that he stepped off a step onto his right foot by accident on Saturday  which jarred it and caused him increased pain.  He is feeling a little better now.     Pertinent History 2 previous back surgeries    Currently in Pain? Yes   Pain Score 2    Pain Orientation Right   Pain Descriptors / Indicators Aching   Pain Type Surgical pain   Pain Onset 1 to 4 weeks ago   Pain Frequency Intermittent               [facility] Adult PT Treatment/Exercise - 03/18/15 0001    Exercises   Exercises Knee/Hip   Knee/Hip Exercises: Stretches   Active Hamstring Stretch 3 reps;30 seconds   Active Hamstring Stretch Limitations on 12"    Knee: Self-Stretch to increase Flexion Right;3 reps;30 seconds   Knee: Self-Stretch Limitations knee driver   Gastroc Stretch Right;1 rep;60 seconds   Gastroc Stretch Limitations slant board    Knee/Hip Exercises: Standing   Heel Raises Both;10 reps   Knee Flexion Right;10 reps   Terminal Knee Extension Strengthening;Right;10 reps   Lateral Step Up Right;10 reps;Step Height: 4"   Forward Step Up Right;10 reps;Step Height: 4"   Rocker Board 1 minute   SLS x 3 Both LE 3" max    Knee/Hip Exercises: Supine   Quad Sets Strengthening;10  reps   Terminal Knee Extension Right;10 reps   Knee Extension PROM   Manual Therapy   Manual Therapy Edema management   Manual therapy comments all manual done seprerate from all other aspects of treatment.    Edema Management pattelar mobilization and decongestive techniques for edema                   PT Short Term Goals - 03/18/15 1144    PT SHORT TERM GOAL #1   Title Pt ROM to improve to 10 to 115 degress to allow improve tolerance of sitting for up to 60 minutes for traveling to the beach    Time 4   Period Weeks   Status On-going   PT SHORT TERM GOAL #2   Title Pt single leg stance to be at least 10 seconds to reduce risk of falling    Time 4   Period Weeks   Status On-going   PT SHORT TERM GOAL #3   Title Pt pain to be no greater than a 2/10 to be able to tolerate weightbearing  activity for an hour.    Time 4   Period Weeks   Status On-going   PT SHORT TERM GOAL #4   Title Pt to be independent with a home exercise program to improve ROM    Time 4   Period Weeks   Status On-going           PT Long Term Goals - 03/18/15 1145    PT LONG TERM GOAL #1   Title Pt ROM to be improved  to 5 to 120 degrees to allow pt  ambulate with a normalized gait pattern   Time 8   Period Weeks   Status On-going   PT LONG TERM GOAL #2   Title Pt single leg stance to be improved to 15 seconds to reduce risk of falls .   Time 8   Period Weeks   PT LONG TERM GOAL #3   Title Pt to be able to demonstrate the ability to go up and down 20 steps to be able to transverse steps at his beach house.    Time 8   Period Weeks   Status On-going   PT LONG TERM GOAL #4   Title Pt to report that he is able to ambulate cofidently over grass and is  working  in his yard.      Time 8   Period Weeks   Status On-going               Plan - 03/18/15 1143    Clinical Impression Statement Therapist reviewed evalluation and goals with patient.  Copy of evaluation given to paitent.  Added new exercises with therapist facilitation for proper technique.     Rehab Potential Good   PT Frequency 3x / week   PT Duration 8 weeks   PT Treatment/Interventions Gait training;Stair training;Therapeutic activities;Electrical Stimulation;Cryotherapy;Therapeutic exercise;Balance training;Patient/family education;Manual techniques   PT Next Visit Plan begin tandem stance activity as well as sit to stand         Problem List Patient Active Problem List   Diagnosis Date Noted  . Primary localized osteoarthritis of right knee 02/26/2015  . Fecal incontinence 09/24/2014  . Precordial pain 01/18/2014  . Adhesive capsulitis of shoulder 06/15/2013  . Decreased range of motion of right shoulder 05/19/2013  . Muscle weakness (generalized) 05/19/2013  . Diabetic autonomic neuropathy ([place]) 05/20/2012   . Other and unspecified hyperlipidemia  05/20/2012  . Insomnia 05/20/2012  . Essential hypertension, benign 05/20/2012    Rayetta Humphrey, PT CLT [phone] 03/18/2015, 11:46 AM  [facility] [address] [place], [place], [address] Phone: [phone]   Fax:  [phone]  Name: Logan Hooper MRN: [id number] Date of Birth: 05-06-1945

## 2015-03-20 ENCOUNTER — Ambulatory Visit (HOSPITAL_COMMUNITY): Payer: Medicare Other

## 2015-03-20 DIAGNOSIS — R262 Difficulty in walking, not elsewhere classified: Secondary | ICD-10-CM

## 2015-03-20 DIAGNOSIS — R2689 Other abnormalities of gait and mobility: Secondary | ICD-10-CM | POA: Diagnosis not present

## 2015-03-20 DIAGNOSIS — M25662 Stiffness of left knee, not elsewhere classified: Secondary | ICD-10-CM

## 2015-03-20 DIAGNOSIS — R269 Unspecified abnormalities of gait and mobility: Secondary | ICD-10-CM

## 2015-03-20 DIAGNOSIS — M6281 Muscle weakness (generalized): Secondary | ICD-10-CM

## 2015-03-20 NOTE — Therapy (Signed)
[place], [place], [address] Phone: [phone]   Fax:  [phone]  Physical Therapy Treatment  Patient Details  Name: Logan Hooper MRN: [id number] Date of Birth: Mar 22, 1945 Referring Provider: Marchia Bond  Encounter Date: 03/20/2015      PT End of Session - 03/20/15 1022    Visit Number 3   Number of Visits 24   Date for PT Re-Evaluation 04/13/15   Authorization Type medicare   Authorization - Visit Number 3   Authorization - Number of Visits 10   PT Start Time [id number]   PT Stop Time 1108   PT Time Calculation (min) 50 min   Activity Tolerance Patient tolerated treatment well   Behavior During Therapy [facility] for tasks assessed/performed      Past Medical History  Diagnosis Date  . Essential hypertension   . Hyperlipidemia   . Arthritis   . ED (erectile dysfunction)   . Sciatica   . PTSD (post-traumatic stress disorder)   . Shortness of breath dyspnea     with exertion  . Nephrolithiasis     30 % use of right kidney  . Renal insufficiency   . Primary localized osteoarthritis of right knee 02/26/2015  . 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 Back surgery    . Video assisted thoracoscopy (vats)/decortication Left 2009    Dr. Arlyce Dice  -left-sided empyema  . Colonoscopy    . Rotator cuff repair      BILATERAL  . Removal of kidney  stones    . 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  Visit Diagnosis:  Abnormality of gait  Stiffness of knee joint, left  Unstable balance  Difficulty walking down step  Muscle weakness (generalized)      Subjective Assessment - 03/20/15 1021     Subjective Pt stated knee is feeling good today, reports swelling is going down and continues to have swelling and stiffness.   Pertinent History 2 previous back surgeries    Patient Stated Goals Pt is ambulating without any assistive device; he wants his pain to be lower and improved motion    Currently in Pain? No/denies              [facility] Adult PT Treatment/Exercise - 03/20/15 0001    Knee/Hip Exercises: Stretches   Active Hamstring Stretch 3 reps;30 seconds   Active Hamstring Stretch Limitations supine   Quad Stretch 3 reps;30 seconds   Quad Stretch Limitations prone with rope   Knee: Self-Stretch Limitations knee drives [id number] 10" on [id number] step   Gastroc Stretch 3 reps;30 seconds   Gastroc Stretch Limitations slant board    Knee/Hip Exercises: Standing   Heel Raises Both;10 reps   Knee Flexion Right;10 reps   Terminal Knee Extension Strengthening;Right;10 reps   Lateral Step Up Right;10 reps;Step Height: 4";Hand Hold: 1   Forward Step Up Right;10 reps;Step Height: 4"   Stairs 1 RT 4 in height; 7in 3RT recirprocal pattern   SLS Rt 8", Lt 5" max of 3   Other Standing Knee Exercises tandem stance 2x 30"   Knee/Hip Exercises: Seated   Sit to  Sand 10 reps;without UE support   Knee/Hip Exercises: Supine   Quad Sets 10 reps   Short Arc Quad Sets 10 reps   Knee Extension PROM   Manual Therapy   Manual Therapy Edema management;Joint mobilization   Manual therapy comments all manual done seprerate from all other aspects of treatment.    Edema Management Retro massage with LE elevated   Joint Mobilization patella mobs all directions             PT Short Term Goals - 03/18/15 1144    PT SHORT TERM GOAL #1   Title Pt ROM to improve to 10 to 115 degress to allow improve tolerance of sitting for up to 60 minutes for traveling to the beach    Time 4   Period Weeks   Status On-going   PT SHORT TERM GOAL #2   Title Pt single leg stance to be at least 10 seconds to reduce risk  of falling    Time 4   Period Weeks   Status On-going   PT SHORT TERM GOAL #3   Title Pt pain to be no greater than a 2/10 to be able to tolerate weightbearing activity for an hour.    Time 4   Period Weeks   Status On-going   PT SHORT TERM GOAL #4   Title Pt to be independent with a home exercise program to improve ROM    Time 4   Period Weeks   Status On-going           PT Long Term Goals - 03/18/15 1145    PT LONG TERM GOAL #1   Title Pt ROM to be improved  to 5 to 120 degrees to allow pt  ambulate with a normalized gait pattern   Time 8   Period Weeks   Status On-going   PT LONG TERM GOAL #2   Title Pt single leg stance to be improved to 15 seconds to reduce risk of falls .   Time 8   Period Weeks   PT LONG TERM GOAL #3   Title Pt to be able to demonstrate the ability to go up and down 20 steps to be able to transverse steps at his beach house.    Time 8   Period Weeks   Status On-going   PT LONG TERM GOAL #4   Title Pt to report that he is able to ambulate cofidently over grass and is  working  in his yard.      Time 8   Period Weeks   Status On-going               Plan - 03/20/15 1422    Clinical Impression Statement Session focus on functional strenghteing, began balance training and manual assistance to assist with edema control.  Began sit to stands for functional strengthening with cueing for eccentric control descending.  Pt stated he plans to go to beach with a lot of steps, instructed step to as well as reciprocal pattern with stairs.  Begain reciprocal stair training with cueing for eccentric control descending.  Pt stated he has started SLS at home though does continue to have difficulties with SLS activities, able to SLS 8" tops today.  Began tandem stance to improve balance with SBA and intermittent HHA required for LOB episodes.  Ended session with retro massage for edema control.  No reports of increased pain through session.     Pt will benefit  from  skilled therapeutic intervention in order to improve on the following deficits Abnormal gait;Decreased activity tolerance;Decreased balance;Decreased range of motion;Decreased strength;Difficulty walking;Pain;Increased edema   Rehab Potential Good   PT Frequency 3x / week   PT Duration 8 weeks   PT Treatment/Interventions Gait training;Stair training;Therapeutic activities;Electrical Stimulation;Cryotherapy;Therapeutic exercise;Balance training;Patient/family education;Manual techniques   PT Next Visit Plan Continue balance and functional strengthening activiites.        Problem List Patient Active Problem List   Diagnosis Date Noted  . Primary localized osteoarthritis of right knee 02/26/2015  . Fecal incontinence 09/24/2014  . Precordial pain 01/18/2014  . Adhesive capsulitis of shoulder 06/15/2013  . Decreased range of motion of right shoulder 05/19/2013  . Muscle weakness (generalized) 05/19/2013  . Diabetic autonomic neuropathy ([place]) 05/20/2012  . Other and unspecified hyperlipidemia 05/20/2012  . Insomnia 05/20/2012  . Essential hypertension, benign 05/20/2012   Ihor Austin, [place]; [place]  Aldona Lento 03/20/2015, 2:31 PM  [facility] [address] [place], [place], [address] Phone: [phone]   Fax:  [phone]  Name: Logan Hooper MRN: [id number] Date of Birth: [birth year]

## 2015-03-21 ENCOUNTER — Ambulatory Visit (HOSPITAL_COMMUNITY): Payer: Medicare Other | Admitting: Physical Therapy

## 2015-03-21 DIAGNOSIS — R262 Difficulty in walking, not elsewhere classified: Secondary | ICD-10-CM | POA: Diagnosis not present

## 2015-03-21 DIAGNOSIS — M25662 Stiffness of left knee, not elsewhere classified: Secondary | ICD-10-CM

## 2015-03-21 DIAGNOSIS — M6281 Muscle weakness (generalized): Secondary | ICD-10-CM | POA: Diagnosis not present

## 2015-03-21 DIAGNOSIS — R2689 Other abnormalities of gait and mobility: Secondary | ICD-10-CM

## 2015-03-21 DIAGNOSIS — R269 Unspecified abnormalities of gait and mobility: Secondary | ICD-10-CM | POA: Diagnosis not present

## 2015-03-21 NOTE — Therapy (Signed)
[place], [place], [address] Phone: [phone]   Fax:  [phone]  Physical Therapy Treatment  Patient Details  Name: MAKENZIE RANNOW MRN: [id number] Date of Birth: 12-28-1945 Referring Provider: Marchia Bond  Encounter Date: 03/21/2015      PT End of Session - 03/21/15 1349    Visit Number 4   Number of Visits 24   Date for PT Re-Evaluation 04/13/15   Authorization Type medicare   Authorization - Visit Number 4   Authorization - Number of Visits 10   PT Start Time [id number]   PT Stop Time [id number]   PT Time Calculation (min) 44 min   Activity Tolerance Patient tolerated treatment well   Behavior During Therapy [facility] for tasks assessed/performed      Past Medical History  Diagnosis Date  . Essential hypertension   . Hyperlipidemia   . Arthritis   . ED (erectile dysfunction)   . Sciatica   . PTSD (post-traumatic stress disorder)   . Shortness of breath dyspnea     with exertion  . Nephrolithiasis     30 % use of right kidney  . Renal insufficiency   . Primary localized osteoarthritis of right knee 02/26/2015  . 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 Back surgery    . Video assisted thoracoscopy (vats)/decortication Left 2009    Dr. Arlyce Dice  -left-sided empyema  . Colonoscopy    . Rotator cuff repair      BILATERAL  . Removal of kidney  stones    . 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  Visit Diagnosis:  Stiffness of knee joint, left  Abnormality of gait  Muscle weakness (generalized)  Unstable balance      Subjective Assessment - 03/21/15 1255    Subjective Knee is feelilng a  little sore today but doing okay. The knee is doing 100% better than he expected to be doing.    Currently in Pain? Yes   Pain Score 2    Pain Location Knee   Pain Orientation Right   Pain Descriptors / Indicators Aching   Pain Type Surgical pain   Aggravating Factors  twisting it or moving suddenly.    Pain Relieving Factors exercise, move it, use ice.             [facility] PT Assessment - 03/21/15 0001    AROM   Right Knee Extension 11   Right Knee Flexion 106                     [facility] Adult PT Treatment/Exercise - 03/21/15 0001    Ambulation/Gait   Ambulation/Gait Yes   Ambulation Distance (Feet) 250 Feet   Gait Comments encouraging knee extension with rt knee stance phase.    Knee/Hip Exercises: Stretches   Passive Hamstring Stretch Right;3 reps;30 seconds   Passive Hamstring Stretch Limitations foot on 12inch step   Gastroc Stretch 3 reps;30 seconds   Gastroc Stretch Limitations manual overpressure   Knee/Hip Exercises: Aerobic   Nustep L1 X 5 minutes   Knee/Hip Exercises: Standing   Lateral Step Up 1 set;10 reps;Step  Height: 6";Hand Hold: 1   Forward Step Up Right;1 set;10 reps;Step Height: 6";Hand Hold: 1   Knee/Hip Exercises: Supine   Quad Sets AROM;Right;1 set;10 reps   [employer] Limitations with overpressure into extension   Short Arc [employer] Strengthening;2 sets;10 reps   Short Arc Quad Sets Limitations emphasis on terminal extension   Heel Slides AROM;Right;1 set;10 reps   Knee Extension PROM   Knee Extension Limitations performed supine and sitting with overpressure into extension stretch.    Manual Therapy   Manual Therapy Joint mobilization   Manual therapy comments all manual done seprerate from all other aspects of treatment.    Joint Mobilization patellar mobs superior/inferior, medial/lateral                PT Education - 03/21/15 1348    Education provided Yes   Education Details knee extension during gait, edema control with  ice and elevation (pt declined doing clinic, will apply at home).    Person(s) Educated Patient   Methods Explanation;Tactile cues;Verbal cues;Demonstration   Comprehension Verbalized understanding;Returned demonstration          PT Short Term Goals - 03/18/15 1144    PT SHORT TERM GOAL #1   Title Pt ROM to improve to 10 to 115 degress to allow improve tolerance of sitting for up to 60 minutes for traveling to the beach    Time 4   Period Weeks   Status On-going   PT SHORT TERM GOAL #2   Title Pt single leg stance to be at least 10 seconds to reduce risk of falling    Time 4   Period Weeks   Status On-going   PT SHORT TERM GOAL #3   Title Pt pain to be no greater than a 2/10 to be able to tolerate weightbearing activity for an hour.    Time 4   Period Weeks   Status On-going   PT SHORT TERM GOAL #4   Title Pt to be independent with a home exercise program to improve ROM    Time 4   Period Weeks   Status On-going           PT Long Term Goals - 03/18/15 1145    PT LONG TERM GOAL #1   Title Pt ROM to be improved  to 5 to 120 degrees to allow pt  ambulate with a normalized gait pattern   Time 8   Period Weeks   Status On-going   PT LONG TERM GOAL #2   Title Pt single leg stance to be improved to 15 seconds to reduce risk of falls .   Time 8   Period Weeks   PT LONG TERM GOAL #3   Title Pt to be able to demonstrate the ability to go up and down 20 steps to be able to transverse steps at his beach house.    Time 8   Period Weeks   Status On-going   PT LONG TERM GOAL #4   Title Pt to report that he is able to ambulate cofidently over grass and is  working  in his yard.      Time 8   Period Weeks   Status On-going               Plan - 03/21/15 1349    Clinical Impression Statement Patient is progressing with PT regarding strength, mobility and ROM. Patient has significant deficits with knee extension which was the primary area addressed  during the session.  Recommending consistent work on knee extension at home. Also, the patient has significant edema through the Rt LE and we reviewed edema control techniques. The patient declined ice and elevation after session and states that he will apply at home. Will continue with PT services and progress ROM and strength as tolerated.    PT Next Visit Plan Continue balance and functional strengthening activiites. Emphasis on knee extension stretches.    PT Home Exercise Plan reinforce knee extension stretches and edema control   Consulted and Agree with Plan of Care Patient        Problem List Patient Active Problem List   Diagnosis Date Noted  . Primary localized osteoarthritis of right knee 02/26/2015  . Fecal incontinence 09/24/2014  . Precordial pain 01/18/2014  . Adhesive capsulitis of shoulder 06/15/2013  . Decreased range of motion of right shoulder 05/19/2013  . Muscle weakness (generalized) 05/19/2013  . Diabetic autonomic neuropathy ([place]) 05/20/2012  . Other and unspecified hyperlipidemia 05/20/2012  . Insomnia 05/20/2012  . Essential hypertension, benign 05/20/2012    Cassell Clement, PT, CSCS Pager [phone]  03/21/2015, 1:55 PM  [facility] [address] [place], [place], [address] Phone: [phone]   Fax:  [phone]  Name: JOHNEY RAFIQ MRN: [id number] Date of Birth: 01-Jun-1945

## 2015-03-22 ENCOUNTER — Encounter (HOSPITAL_COMMUNITY): Payer: Medicare Other

## 2015-03-25 ENCOUNTER — Ambulatory Visit (HOSPITAL_COMMUNITY): Payer: Medicare Other | Admitting: Physical Therapy

## 2015-03-25 DIAGNOSIS — M25662 Stiffness of left knee, not elsewhere classified: Secondary | ICD-10-CM | POA: Diagnosis not present

## 2015-03-25 DIAGNOSIS — R269 Unspecified abnormalities of gait and mobility: Secondary | ICD-10-CM | POA: Diagnosis not present

## 2015-03-25 DIAGNOSIS — R262 Difficulty in walking, not elsewhere classified: Secondary | ICD-10-CM

## 2015-03-25 DIAGNOSIS — R2689 Other abnormalities of gait and mobility: Secondary | ICD-10-CM | POA: Diagnosis not present

## 2015-03-25 DIAGNOSIS — M6281 Muscle weakness (generalized): Secondary | ICD-10-CM

## 2015-03-25 NOTE — Therapy (Signed)
[place], [place], [address] Phone: [phone]   Fax:  [phone]  Physical Therapy Treatment  Patient Details  Name: Logan Hooper MRN: [id number] Date of Birth: 16-Feb-1945 Referring Provider: Marchia Bond  Encounter Date: 03/25/2015      PT End of Session - 03/25/15 1227    Visit Number 5   Number of Visits 24   Date for PT Re-Evaluation 04/13/15   Authorization Type medicare   Authorization - Visit Number 5   Authorization - Number of Visits 10   PT Start Time [id number]   PT Stop Time 1225   PT Time Calculation (min) 50 min   Activity Tolerance Patient tolerated treatment well   Behavior During Therapy [facility] for tasks assessed/performed      Past Medical History  Diagnosis Date  . Essential hypertension   . Hyperlipidemia   . Arthritis   . ED (erectile dysfunction)   . Sciatica   . PTSD (post-traumatic stress disorder)   . Shortness of breath dyspnea     with exertion  . Nephrolithiasis     30 % use of right kidney  . Renal insufficiency   . Primary localized osteoarthritis of right knee 02/26/2015  . 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 Back surgery    . Video assisted thoracoscopy (vats)/decortication Left 2009    Dr. Arlyce Dice  -left-sided empyema  . Colonoscopy    . Rotator cuff repair      BILATERAL  . Removal of kidney  stones    . 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  Visit Diagnosis:  Abnormality of gait  Muscle weakness (generalized)  Difficulty walking down step  Unstable balance      Subjective Assessment - 03/25/15 1233    Subjective Pt states his knee is  stiff and has more trouble with it during the night.  More swelling in the evenings.  No pain currently    Currently in Pain? No/denies                         [facility] Adult PT Treatment/Exercise - 03/25/15 0001    Knee/Hip Exercises: Stretches   Active Hamstring Stretch 3 reps;30 seconds   Active Hamstring Stretch Limitations 12" step   Knee: Self-Stretch Limitations knee drives [id number] 10" on [id number] step   Gastroc Stretch 3 reps;30 seconds   Gastroc Stretch Limitations slant board   Knee/Hip Exercises: Aerobic   Stationary Bike working on ROM, seat 15 full revolutions 5 minutes   Knee/Hip Exercises: Standing   Lateral Step Up 1 set;Step Height: 6";Hand Hold: 1;15 reps   Forward Step Up Right;1 set;Step Height: 6";Hand Hold: 1;15 reps   SLS Rt 8", Lt 5" max of 3   Knee/Hip Exercises: Supine   Quad Sets AROM;Right;1 set;10 reps   Quad Sets Limitations with overpressure to increase extension   Heel Slides AROM;Right;1 set;10 reps   Heel Slides Limitations 110 degrees   Knee Extension PROM   Knee Extension Limitations performed supine and sitting with overpressure into extension stretch.    Other Supine Knee/Hip Exercises 11-110  Manual Therapy   Manual Therapy Joint mobilization;Edema management;Myofascial release   Manual therapy comments all manual done seprerate from all other aspects of treatment.    Edema Management Retro massage with LE elevated   Joint Mobilization patellar mobs superior/inferior, medial/lateral   Myofascial Release to scar tissue/adhesions medial and lateral knee and scar line                  PT Short Term Goals - 03/18/15 1144    PT SHORT TERM GOAL #1   Title Pt ROM to improve to 10 to 115 degress to allow improve tolerance of sitting for up to 60 minutes for traveling to the beach    Time 4   Period Weeks   Status On-going   PT SHORT TERM GOAL #2   Title Pt single leg stance to be at least 10 seconds to reduce risk of falling     Time 4   Period Weeks   Status On-going   PT SHORT TERM GOAL #3   Title Pt pain to be no greater than a 2/10 to be able to tolerate weightbearing activity for an hour.    Time 4   Period Weeks   Status On-going   PT SHORT TERM GOAL #4   Title Pt to be independent with a home exercise program to improve ROM    Time 4   Period Weeks   Status On-going           PT Long Term Goals - 03/18/15 1145    PT LONG TERM GOAL #1   Title Pt ROM to be improved  to 5 to 120 degrees to allow pt  ambulate with a normalized gait pattern   Time 8   Period Weeks   Status On-going   PT LONG TERM GOAL #2   Title Pt single leg stance to be improved to 15 seconds to reduce risk of falls .   Time 8   Period Weeks   PT LONG TERM GOAL #3   Title Pt to be able to demonstrate the ability to go up and down 20 steps to be able to transverse steps at his beach house.    Time 8   Period Weeks   Status On-going   PT LONG TERM GOAL #4   Title Pt to report that he is able to ambulate cofidently over grass and is  working  in his yard.      Time 8   Period Weeks   Status On-going               Plan - 03/25/15 1227    Clinical Impression Statement Continued primary focus on improving ROM of Rt knee.  Completed myofascial techiniques followed up wtih retro massage to decrease adhesions and reduce induration prior to completing AAROM/PROM to Rt knee.  Pt with overall improved ROM with increased flexion to 110 degrees (4 degrees better than last session).  Extension remains at 11 degrees.  Pt completed all therex today without difficulty.    PT Next Visit Plan Continue balance and functional strengthening activiites. Emphasis on knee extension stretches.    PT Home Exercise Plan verbalized addition of prone knee hang and using ottoman to prop LE on to encourage extension   Consulted and Agree with Plan of Care Patient        Problem List Patient Active Problem List   Diagnosis Date Noted  .  Primary localized osteoarthritis of right knee 02/26/2015  .  Fecal incontinence 09/24/2014  . Precordial pain 01/18/2014  . Adhesive capsulitis of shoulder 06/15/2013  . Decreased range of motion of right shoulder 05/19/2013  . Muscle weakness (generalized) 05/19/2013  . Diabetic autonomic neuropathy ([place]) 05/20/2012  . Other and unspecified hyperlipidemia 05/20/2012  . Insomnia 05/20/2012  . Essential hypertension, benign 05/20/2012    Teena Irani, PTA/CLT [phone]  03/25/2015, 12:33 PM  [facility] [address] [place], [place], [address] Phone: [phone]   Fax:  [phone]  Name: Logan Hooper MRN: [id number] Date of Birth: Jun 04, 1945

## 2015-03-27 ENCOUNTER — Encounter (HOSPITAL_COMMUNITY): Payer: Medicare Other | Admitting: Physical Therapy

## 2015-03-28 ENCOUNTER — Ambulatory Visit (HOSPITAL_COMMUNITY): Payer: Medicare Other | Admitting: Physical Therapy

## 2015-03-28 DIAGNOSIS — R269 Unspecified abnormalities of gait and mobility: Secondary | ICD-10-CM | POA: Diagnosis not present

## 2015-03-28 DIAGNOSIS — M25662 Stiffness of left knee, not elsewhere classified: Secondary | ICD-10-CM | POA: Diagnosis not present

## 2015-03-28 DIAGNOSIS — M6281 Muscle weakness (generalized): Secondary | ICD-10-CM

## 2015-03-28 DIAGNOSIS — R262 Difficulty in walking, not elsewhere classified: Secondary | ICD-10-CM | POA: Diagnosis not present

## 2015-03-28 DIAGNOSIS — R2689 Other abnormalities of gait and mobility: Secondary | ICD-10-CM | POA: Diagnosis not present

## 2015-03-28 NOTE — Therapy (Signed)
[place], [place], [address] Phone: [phone]   Fax:  [phone]  Physical Therapy Treatment  Patient Details  Name: Logan Hooper MRN: [id number] Date of Birth: 11/17/1945 Referring Provider: Marchia Bond  Encounter Date: 03/28/2015      PT End of Session - 03/28/15 1331    Visit Number 6   Number of Visits 24   Date for PT Re-Evaluation 04/13/15   Authorization Type medicare   Authorization - Visit Number 6   Authorization - Number of Visits 10   PT Start Time 1252   PT Stop Time 1335   PT Time Calculation (min) 43 min   Activity Tolerance Patient tolerated treatment well   Behavior During Therapy [facility] for tasks assessed/performed      Past Medical History  Diagnosis Date  . Essential hypertension   . Hyperlipidemia   . Arthritis   . ED (erectile dysfunction)   . Sciatica   . PTSD (post-traumatic stress disorder)   . Shortness of breath dyspnea     with exertion  . Nephrolithiasis     30 % use of right kidney  . Renal insufficiency   . Primary localized osteoarthritis of right knee 02/26/2015  . 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 Back surgery    . Video assisted thoracoscopy (vats)/decortication Left 2009    Dr. Arlyce Dice  -left-sided empyema  . Colonoscopy    . Rotator cuff repair      BILATERAL  . Removal of kidney  stones    . 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 Total knee arthroplasty Right 02/26/2015    Procedure: TOTAL KNEE ARTHROPLASTY;  Surgeon: Marchia Bond, MD;  Location: [facility];  Service: Orthopedics;  Laterality: Right;    There were no vitals filed for this visit.  Visit Diagnosis:  Abnormality of gait  Muscle weakness (generalized)  Difficulty walking down step  Unstable balance  Stiffness of knee joint, left      Subjective Assessment - 03/28/15 1256    Subjective Pt states he woke up today with increased knee pain. He still reports swelling and stiffness. Pain is no more than a 2/10. He says his HEP is going ok, some are easier than others                         [facility] Adult PT Treatment/Exercise - 03/28/15 0001    Knee/Hip Exercises: Supine   Quad Sets 2 sets;10 reps   Quad Sets Limitations ,over pressure applied for the second set   Short Arc [employer] Right;2 sets;10 reps   Heel Slides Right;1 set;10 reps  5 sec hold   Heel Slides Limitations 110   Bridges with Ball Squeeze 2 sets;10 reps;Both  without ball squeeze   Manual Therapy   Manual Therapy Joint mobilization;Edema management;Myofascial release   Manual therapy comments all manual done seprerate from all other aspects of treatment.    Joint Mobilization Gade I-III PA tibiofemoral jt mobs  pt in prone                PT Education - 03/28/15 1327    Education provided Yes   Education Details Reviewed edema management with ice 3-4x/day no longer than 20 min along with elevation. Encouraged pt to continue with his HEP focusing on knee  ext.   Person(s) Educated Patient   Methods Explanation;Tactile cues;Verbal cues   Comprehension Verbalized understanding;Returned demonstration          PT Short Term Goals - 03/18/15 1144    PT SHORT TERM GOAL #1   Title Pt ROM to improve to 10 to 115 degress to allow improve tolerance of sitting for up to 60 minutes for traveling to the beach    Time 4   Period Weeks   Status On-going   PT SHORT TERM GOAL #2   Title Pt single leg stance to be at least 10 seconds to reduce risk of falling    Time 4   Period Weeks   Status On-going   PT SHORT TERM GOAL #3   Title Pt pain to be no greater than a 2/10 to be able to tolerate weightbearing activity for an hour.    Time 4   Period Weeks   Status On-going   PT SHORT TERM GOAL #4   Title Pt to be independent with a home exercise program to improve ROM    Time  4   Period Weeks   Status On-going           PT Long Term Goals - 03/18/15 1145    PT LONG TERM GOAL #1   Title Pt ROM to be improved  to 5 to 120 degrees to allow pt  ambulate with a normalized gait pattern   Time 8   Period Weeks   Status On-going   PT LONG TERM GOAL #2   Title Pt single leg stance to be improved to 15 seconds to reduce risk of falls .   Time 8   Period Weeks   PT LONG TERM GOAL #3   Title Pt to be able to demonstrate the ability to go up and down 20 steps to be able to transverse steps at his beach house.    Time 8   Period Weeks   Status On-going   PT LONG TERM GOAL #4   Title Pt to report that he is able to ambulate cofidently over grass and is  working  in his yard.      Time 8   Period Weeks   Status On-going               Plan - 03/28/15 1333    Clinical Impression Statement Pt continues to present with post surgical swelling, limited ROM and decreased strength. He responded well to manual treatment with improved report of knee stiffness. Will continue current POC with emphasis on regaining knee extension for improved gait, mobility and strength.   Pt will benefit from skilled therapeutic intervention in order to improve on the following deficits Abnormal gait;Decreased activity tolerance;Decreased balance;Decreased range of motion;Decreased strength;Difficulty walking;Pain;Increased edema   Rehab Potential Good   PT Frequency 3x / week   PT Duration 8 weeks   PT Treatment/Interventions Gait training;Stair training;Therapeutic activities;Electrical Stimulation;Cryotherapy;Therapeutic exercise;Balance training;Patient/family education;Manual techniques   PT Next Visit Plan Emphasis on manual treatment and theres for improved knee extension. Continue balance and functional strengthening activiites. .    PT Home Exercise Plan verbalized addition of prone knee hang and using ottoman to prop LE on to encourage extension   Consulted and Agree with  Plan of Care Patient        Problem List Patient Active Problem List   Diagnosis Date Noted  . Primary localized osteoarthritis of right knee 02/26/2015  . Fecal incontinence 09/24/2014  .  Precordial pain 01/18/2014  . Adhesive capsulitis of shoulder 06/15/2013  . Decreased range of motion of right shoulder 05/19/2013  . Muscle weakness (generalized) 05/19/2013  . Diabetic autonomic neuropathy ([place]) 05/20/2012  . Other and unspecified hyperlipidemia 05/20/2012  . Insomnia 05/20/2012  . Essential hypertension, benign 05/20/2012   4:15 PM,03/28/2015 Elly Modena PT, DPT [name] [place] [address] [place], [place], [address] Phone: [phone]   Fax:  [phone]  Name: KAELEN MOAK MRN: [id number] Date of Birth: 03/24/1945

## 2015-03-29 ENCOUNTER — Encounter (HOSPITAL_COMMUNITY): Payer: Medicare Other

## 2015-03-29 ENCOUNTER — Ambulatory Visit (HOSPITAL_COMMUNITY): Payer: Medicare Other

## 2015-03-29 DIAGNOSIS — R2689 Other abnormalities of gait and mobility: Secondary | ICD-10-CM

## 2015-03-29 DIAGNOSIS — M6281 Muscle weakness (generalized): Secondary | ICD-10-CM | POA: Diagnosis not present

## 2015-03-29 DIAGNOSIS — R269 Unspecified abnormalities of gait and mobility: Secondary | ICD-10-CM | POA: Diagnosis not present

## 2015-03-29 DIAGNOSIS — R262 Difficulty in walking, not elsewhere classified: Secondary | ICD-10-CM | POA: Diagnosis not present

## 2015-03-29 DIAGNOSIS — M25662 Stiffness of left knee, not elsewhere classified: Secondary | ICD-10-CM | POA: Diagnosis not present

## 2015-03-29 NOTE — Therapy (Signed)
[name], [place], [address] Phone: [phone]   Fax:  [phone]  Physical Therapy Treatment  Patient Details  Name: Logan Hooper MRN: [id number] Date of Birth: [birth year] Referring Provider: Marchia Bond  Encounter Date: 03/29/2015      PT End of Session - 03/29/15 1152    Visit Number 7   Number of Visits 24   Date for PT Re-Evaluation 04/13/15  MD apt 04/10/2015   Authorization Type medicare   Authorization - Visit Number 7   Authorization - Number of Visits 10   PT Start Time [id number]   PT Stop Time 1245   PT Time Calculation (min) 57 min   Activity Tolerance Patient tolerated treatment well   Behavior During Therapy [facility] for tasks assessed/performed      Past Medical History  Diagnosis Date  . Essential hypertension   . Hyperlipidemia   . Arthritis   . ED (erectile dysfunction)   . Sciatica   . PTSD (post-traumatic stress disorder)   . Shortness of breath dyspnea     with exertion  . Nephrolithiasis     30 % use of right kidney  . Renal insufficiency   . Primary localized osteoarthritis of right knee 02/26/2015  . 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 Back surgery    . Video assisted thoracoscopy (vats)/decortication Left 2009    Dr. Arlyce Dice  -left-sided empyema  . Colonoscopy    . Rotator cuff repair      BILATERAL  . Removal of kidney  stones    . 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  Visit Diagnosis:  Abnormality of gait  Muscle weakness (generalized)  Difficulty walking down step  Unstable balance  Stiffness of knee joint, left      Subjective Assessment  - 03/29/15 1150    Subjective Pt stated knee is feeling good today, reports minimal pain on medial portion of knee with sharp pain certain movements.   Pt stated he complete prone knee hangs for 10 minutes (with rest breaks) this morning   Pertinent History 2 previous back surgeries    Patient Stated Goals Pt is ambulating without any assistive device; he wants his pain to be lower and improved motion    Currently in Pain? Yes   Pain Score 1    Pain Location Knee   Pain Orientation Right;Medial   Pain Descriptors / Indicators Aching;Sore;Sharp  little "pain" on inside of knee   Pain Type Surgical pain   Pain Onset 1 to 4 weeks ago   Pain Frequency Intermittent   Aggravating Factors  twisting it or moving suddenly   Pain Relieving Factors exercise, move it, use ice   Effect of Pain on Daily Activities increases            [facility] PT Assessment - 03/29/15 0001    Assessment   Medical Diagnosis Rt total knee replacement   Referring Provider Marchia Bond   Onset Date/Surgical Date 02/26/15   Next MD Visit 04/10/2015   Prior Therapy home health            [facility] Adult PT  Treatment/Exercise - 03/29/15 0001    Knee/Hip Exercises: Stretches   Active Hamstring Stretch 3 reps;30 seconds   Active Hamstring Stretch Limitations supine with rope   Gastroc Stretch 3 reps;30 seconds   Gastroc Stretch Limitations slant board   Knee/Hip Exercises: Aerobic   Stationary Bike working on ROM, seat 15 full revolutions 5 minutes   Knee/Hip Exercises: Standing   Terminal Knee Extension Strengthening;Right;15 reps;Theraband   Theraband Level (Terminal Knee Extension) Level 2 (Red)   Lateral Step Up Right;15 reps;Hand Hold: 2;Step Height: 6"   Forward Step Up Right;1 set;Step Height: 6";Hand Hold: 1;15 reps   SLS Lt 9", Rt 7"   Knee/Hip Exercises: Supine   Quad Sets 2 sets;10 reps   Quad Sets Limitations AAROM for extension   Short Arc Quad Sets 15 reps   Heel Slides Right;1 set;10 reps    Heel Slides Limitations 9-114   Knee Extension PROM   Knee Extension Limitations supine 3x 20" holds with pt. counting outload to reduce holding breath during PROM   Modalities   Modalities Cryotherapy   Cryotherapy   Number Minutes Cryotherapy 10 Minutes   Cryotherapy Location Knee   Type of Cryotherapy Ice pack   Manual Therapy   Manual Therapy Joint mobilization;Edema management;Myofascial release   Manual therapy comments all manual done seprerate from all other aspects of treatment.    Edema Management Retro massage with LE elevated   Joint Mobilization Grade I-III PA tibiofemoral jt mobs   Myofascial Release to scar tissue/adhesions medial and lateral knee and scar line                PT Education - 03/28/15 1327    Education provided Yes   Education Details Reviewed edema management with ice 3-4x/day no longer than 20 min along with elevation. Encouraged pt to continue with his HEP focusing on knee ext.   Person(s) Educated Patient   Methods Explanation;Tactile cues;Verbal cues   Comprehension Verbalized understanding;Returned demonstration          PT Short Term Goals - 03/18/15 1144    PT SHORT TERM GOAL #1   Title Pt ROM to improve to 10 to 115 degress to allow improve tolerance of sitting for up to 60 minutes for traveling to the beach    Time 4   Period Weeks   Status On-going   PT SHORT TERM GOAL #2   Title Pt single leg stance to be at least 10 seconds to reduce risk of falling    Time 4   Period Weeks   Status On-going   PT SHORT TERM GOAL #3   Title Pt pain to be no greater than a 2/10 to be able to tolerate weightbearing activity for an hour.    Time 4   Period Weeks   Status On-going   PT SHORT TERM GOAL #4   Title Pt to be independent with a home exercise program to improve ROM    Time 4   Period Weeks   Status On-going           PT Long Term Goals - 03/18/15 1145    PT LONG TERM GOAL #1   Title Pt ROM to be improved  to 5 to 120  degrees to allow pt  ambulate with a normalized gait pattern   Time 8   Period Weeks   Status On-going   PT LONG TERM GOAL #2   Title Pt single leg stance to be improved to 15  seconds to reduce risk of falls .   Time 8   Period Weeks   PT LONG TERM GOAL #3   Title Pt to be able to demonstrate the ability to go up and down 20 steps to be able to transverse steps at his beach house.    Time 8   Period Weeks   Status On-going   PT LONG TERM GOAL #4   Title Pt to report that he is able to ambulate cofidently over grass and is  working  in his yard.      Time 8   Period Weeks   Status On-going               Plan - 03/29/15 1240    Clinical Impression Statement Session focus on improving knee mobility and functional strenghteing.  Pt continued to be limited AROM especially with extension, continued quad strenghteing and manual techniques to improve knee mobility with manual retro massage, scar tissue MFR techniques and PROM per pt. tolerance.  Pt making small gains with AROM at 9-114 degrees following manual.  Ice applied at end of session for pain and edema control prior plans for increased standing activitieis later today.  No reports of increased pain throiugh session.     Pt will benefit from skilled therapeutic intervention in order to improve on the following deficits Abnormal gait;Decreased activity tolerance;Decreased balance;Decreased range of motion;Decreased strength;Difficulty walking;Pain;Increased edema   Rehab Potential Good   PT Frequency 3x / week   PT Duration 8 weeks   PT Treatment/Interventions Gait training;Stair training;Therapeutic activities;Electrical Stimulation;Cryotherapy;Therapeutic exercise;Balance training;Patient/family education;Manual techniques   PT Next Visit Plan Emphasis on manual treatment and theres for improved knee extension. Continue balance and functional strengthening activiites. .    PT Home Exercise Plan verbalized addition of prone knee  hang and using ottoman to prop LE on to encourage extension        Problem List Patient Active Problem List   Diagnosis Date Noted  . Primary localized osteoarthritis of right knee 02/26/2015  . Fecal incontinence 09/24/2014  . Precordial pain 01/18/2014  . Adhesive capsulitis of shoulder 06/15/2013  . Decreased range of motion of right shoulder 05/19/2013  . Muscle weakness (generalized) 05/19/2013  . Diabetic autonomic neuropathy ([place]) 05/20/2012  . Other and unspecified hyperlipidemia 05/20/2012  . Insomnia 05/20/2012  . Essential hypertension, benign 05/20/2012   Ihor [place], [place]; [place]  Aldona Lento 03/29/2015, 6:10 PM  [place], [place], [address] Phone: [phone]   Fax:  [phone]  Name: TESEAN SUGERMAN MRN: [id number] Date of Birth: [birth year]

## 2015-04-01 ENCOUNTER — Ambulatory Visit (HOSPITAL_COMMUNITY): Payer: Medicare Other | Admitting: Physical Therapy

## 2015-04-01 DIAGNOSIS — M25662 Stiffness of left knee, not elsewhere classified: Secondary | ICD-10-CM

## 2015-04-01 DIAGNOSIS — R262 Difficulty in walking, not elsewhere classified: Secondary | ICD-10-CM

## 2015-04-01 DIAGNOSIS — R2689 Other abnormalities of gait and mobility: Secondary | ICD-10-CM

## 2015-04-01 DIAGNOSIS — M6281 Muscle weakness (generalized): Secondary | ICD-10-CM

## 2015-04-01 DIAGNOSIS — R269 Unspecified abnormalities of gait and mobility: Secondary | ICD-10-CM

## 2015-04-01 NOTE — Therapy (Signed)
[place], [place], [address] Phone: [phone]   Fax:  [phone]  Physical Therapy Treatment  Patient Details  Name: Logan Hooper MRN: [id number] Date of Birth: 1945-01-18 Referring Provider: Marchia Bond  Encounter Date: 04/01/2015      PT End of Session - 04/01/15 0935    PT Start Time 0850   PT Stop Time 0940   PT Time Calculation (min) 50 min   Activity Tolerance Patient tolerated treatment well      Past Medical History  Diagnosis Date  . Essential hypertension   . Hyperlipidemia   . Arthritis   . ED (erectile dysfunction)   . Sciatica   . PTSD (post-traumatic stress disorder)   . Shortness of breath dyspnea     with exertion  . Nephrolithiasis     30 % use of right kidney  . Renal insufficiency   . Primary localized osteoarthritis of right knee 02/26/2015  . 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 Back surgery    . Video assisted thoracoscopy (vats)/decortication Left 2009    Dr. Arlyce Dice  -left-sided empyema  . Colonoscopy    . Rotator cuff repair      BILATERAL  . Removal of kidney  stones    . 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 Total knee arthroplasty Right 02/26/2015    Procedure: TOTAL KNEE ARTHROPLASTY;  Surgeon: Marchia Bond, MD;  Location: [facility];  Service: Orthopedics;  Laterality: Right;    There were no vitals filed for this visit.  Visit Diagnosis:  Abnormality of gait  Muscle weakness (generalized)  Difficulty walking down step  Unstable balance  Stiffness of knee joint, left      Subjective Assessment - 04/01/15 0852    Subjective I am doing well the swelling is coming down.  The hardest thing is to get my leg straight.     Currently in Pain? No/denies                         [facility] Adult PT Treatment/Exercise - 04/01/15 0001     Exercises   Exercises Knee/Hip   Knee/Hip Exercises: Stretches   Passive Hamstring Stretch Right;60 seconds   Knee: Self-Stretch Limitations knee drives [id number] 10" on [id number] step   Gastroc Stretch 3 reps;30 seconds   Gastroc Stretch Limitations slant board   Other Knee/Hip Stretches supine knee extension stretch with 4# on knee x 2'    Knee/Hip Exercises: Standing   Heel Raises 15 reps   Knee Flexion Strengthening;Right;10 reps   Knee Flexion Limitations 4 #    Terminal Knee Extension Strengthening;Right;10 reps   Lateral Step Up Right;15 reps;Hand Hold: 2;Step Height: 6"   Forward Step Up Right;1 set;Step Height: 6";Hand Hold: 1;15 reps   Rocker Board 2 minutes   SLS B x 5 each    Knee/Hip Exercises: Supine   Quad Sets Strengthening;Right;10 reps   Knee Extension PROM   Knee Extension Limitations supine 3x 20" holds with pt. counting outload to reduce holding breath during PROM   Other Supine Knee/Hip Exercises 11-115        IP x 10 minutes no charge         PT Education - 04/01/15 0919    Education provided Yes   Education Details Pt to complete 30 mintues  self mobilization supine with rice on knee    Person(s) Educated Patient   Methods Explanation   Comprehension Verbalized understanding          PT Short Term Goals - 03/18/15 1144    PT SHORT TERM GOAL #1   Title Pt ROM to improve to 10 to 115 degress to allow improve tolerance of sitting for up to 60 minutes for traveling to the beach    Time 4   Period Weeks   Status On-going   PT SHORT TERM GOAL #2   Title Pt single leg stance to be at least 10 seconds to reduce risk of falling    Time 4   Period Weeks   Status On-going   PT SHORT TERM GOAL #3   Title Pt pain to be no greater than a 2/10 to be able to tolerate weightbearing activity for an hour.    Time 4   Period Weeks   Status On-going   PT SHORT TERM GOAL #4   Title Pt to be independent with a home exercise program to improve ROM    Time 4   Period  Weeks   Status On-going           PT Long Term Goals - 03/18/15 1145    PT LONG TERM GOAL #1   Title Pt ROM to be improved  to 5 to 120 degrees to allow pt  ambulate with a normalized gait pattern   Time 8   Period Weeks   Status On-going   PT LONG TERM GOAL #2   Title Pt single leg stance to be improved to 15 seconds to reduce risk of falls .   Time 8   Period Weeks   PT LONG TERM GOAL #3   Title Pt to be able to demonstrate the ability to go up and down 20 steps to be able to transverse steps at his beach house.    Time 8   Period Weeks   Status On-going   PT LONG TERM GOAL #4   Title Pt to report that he is able to ambulate cofidently over grass and is  working  in his yard.      Time 8   Period Weeks   Status On-going               Plan - 04/01/15 0905    Clinical Impression Statement Pt session focused on balance and straightening of knee.  PT continues to need verbal and tactile cuing to perform exercises correctly.  Pt shown self extension stretch while supine as he states prone extension stretch is not working for him . PT continues to improve in all aspects.  Current PROM 8-116    PT Next Visit Plan Continue to work on extension and balance.         Problem List Patient Active Problem List   Diagnosis Date Noted  . Primary localized osteoarthritis of right knee 02/26/2015  . Fecal incontinence 09/24/2014  . Precordial pain 01/18/2014  . Adhesive capsulitis of shoulder 06/15/2013  . Decreased range of motion of right shoulder 05/19/2013  . Muscle weakness (generalized) 05/19/2013  . Diabetic autonomic neuropathy ([place]) 05/20/2012  . Other and unspecified hyperlipidemia 05/20/2012  . Insomnia 05/20/2012  . Essential hypertension, benign 05/20/2012    Rayetta Humphrey, PT CLT [phone] 04/01/2015, 9:37 AM  [place] [address] [place], [place], [address] Phone: [phone]   Fax:  [phone]  Name: Logan Hooper MRN: [id number] Date of Birth: 16-Mar-1945

## 2015-04-03 ENCOUNTER — Ambulatory Visit (HOSPITAL_COMMUNITY): Payer: Medicare Other

## 2015-04-03 DIAGNOSIS — M25662 Stiffness of left knee, not elsewhere classified: Secondary | ICD-10-CM

## 2015-04-03 DIAGNOSIS — R269 Unspecified abnormalities of gait and mobility: Secondary | ICD-10-CM | POA: Diagnosis not present

## 2015-04-03 DIAGNOSIS — R2689 Other abnormalities of gait and mobility: Secondary | ICD-10-CM

## 2015-04-03 DIAGNOSIS — R262 Difficulty in walking, not elsewhere classified: Secondary | ICD-10-CM

## 2015-04-03 DIAGNOSIS — M6281 Muscle weakness (generalized): Secondary | ICD-10-CM

## 2015-04-03 NOTE — Therapy (Addendum)
[place], [place], [address] Phone: [phone]   Fax:  [phone]  Physical Therapy Treatment  Patient Details  Name: Logan Hooper MRN: [id number] Date of Birth: 01/27/1945 Referring Provider: Marchia Bond  Encounter Date: 04/03/2015      PT End of Session - 04/03/15 0958    Visit Number 10   Number of Visits 24   Date for PT Re-Evaluation 04/13/15   Authorization Type medicare   Authorization - Visit Number 10   Authorization - Number of Visits 20   PT Start Time 0932   PT Stop Time 1026   PT Time Calculation (min) 54 min   Activity Tolerance Patient tolerated treatment well   Behavior During Therapy [facility] for tasks assessed/performed      Past Medical History  Diagnosis Date  . Essential hypertension   . Hyperlipidemia   . Arthritis   . ED (erectile dysfunction)   . Sciatica   . PTSD (post-traumatic stress disorder)   . Shortness of breath dyspnea     with exertion  . Nephrolithiasis     30 % use of right kidney  . Renal insufficiency   . Primary localized osteoarthritis of right knee 02/26/2015  . 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 Back surgery    . Video assisted thoracoscopy (vats)/decortication Left 2009    Dr. Arlyce Dice  -left-sided empyema  . Colonoscopy    . Rotator cuff repair      BILATERAL  . Removal of kidney  stones    . 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  Visit Diagnosis:  Abnormality of gait  Muscle weakness (generalized)  Difficulty walking down step  Unstable balance  Stiffness of knee joint, left      Subjective Assessment - 04/03/15 0942     Subjective Pt stated knee is doing well, continues to c/o pain medial portion of knee with certain movements   Pertinent History 2 previous back surgeries    Patient Stated Goals Pt is ambulating without any assistive device; he wants his pain to be lower and improved motion    Currently in Pain? Yes   Pain Score 1    Pain Location Knee   Pain Orientation Right;Medial   Pain Descriptors / Indicators Tender  Tender medial portion of knee   Pain Type Surgical pain   Pain Onset 1 to 4 weeks ago   Pain Frequency Intermittent          [facility] Adult PT Treatment/Exercise - 04/03/15 0001    Knee/Hip Exercises: Stretches   Passive Hamstring Stretch Right;3 reps;60 seconds   Gastroc Stretch 3 reps;30 seconds   Gastroc Stretch Limitations slant board   Knee/Hip Exercises: Standing   Terminal Knee Extension Strengthening;Right;10 reps   Theraband Level (Terminal Knee Extension) Level 2 (Red)   Lateral Step Up Right;15 reps;Hand Hold: 2;Step Height: 6"   Forward Step Up Right;1 set;Step Height: 6";Hand Hold: 1;15 reps   Step Down 10 reps;Step Height: 6";Hand Hold: 2   Rocker Board 2 minutes   SLS 5" each max of 3 Bil LE   Other Standing Knee Exercises  tandem stance 2x 30"   Knee/Hip Exercises: Supine   Short Arc Quad Sets 15 reps   Heel Slides Right;1 set;10 reps   Heel Slides Limitations 8-117   Patellar Mobs Complete and instructed   Knee Extension PROM   Knee Extension Limitations supine 3x 30" holds with pt. counting outload to reduce holding breath during PROM   Other Supine Knee/Hip Exercises 8-117   Modalities   Modalities Cryotherapy   Cryotherapy   Number Minutes Cryotherapy 10 Minutes   Cryotherapy Location Knee   Type of Cryotherapy Ice pack   Manual Therapy   Manual Therapy Joint mobilization;Passive ROM   Manual therapy comments all manual done seprerate from all other aspects of treatment.    Joint Mobilization Grade I-III PA tibiofemoral jt mobs   Passive ROM knee  extension                  PT Short Term Goals - 03/18/15 1144    PT SHORT TERM GOAL #1   Title Pt ROM to improve to 10 to 115 degress to allow improve tolerance of sitting for up to 60 minutes for traveling to the beach    Time 4   Period Weeks   Status On-going   PT SHORT TERM GOAL #2   Title Pt single leg stance to be at least 10 seconds to reduce risk of falling    Time 4   Period Weeks   Status On-going   PT SHORT TERM GOAL #3   Title Pt pain to be no greater than a 2/10 to be able to tolerate weightbearing activity for an hour.    Time 4   Period Weeks   Status On-going   PT SHORT TERM GOAL #4   Title Pt to be independent with a home exercise program to improve ROM    Time 4   Period Weeks   Status On-going           PT Long Term Goals - 03/18/15 1145    PT LONG TERM GOAL #1   Title Pt ROM to be improved  to 5 to 120 degrees to allow pt  ambulate with a normalized gait pattern   Time 8   Period Weeks   Status On-going   PT LONG TERM GOAL #2   Title Pt single leg stance to be improved to 15 seconds to reduce risk of falls .   Time 8   Period Weeks   PT LONG TERM GOAL #3   Title Pt to be able to demonstrate the ability to go up and down 20 steps to be able to transverse steps at his beach house.    Time 8   Period Weeks   Status On-going   PT LONG TERM GOAL #4   Title Pt to report that he is able to ambulate cofidently over grass and is  working  in his yard.      Time 8   Period Weeks   Status On-going               Plan - 04/03/15 1157    Clinical Impression Statement Continued session focus on improving knee extension and balance training.  Pt continues to be limited knee extension.  PROM complete within pt tolerance.  Improved AROM to 8-117 following manual.  Pt does continue to demonstrate impaired balance with min assistnace required with balance activities.  Able to SLS 5" max Bil LE.  Ended session with ice (  no charge) for pain and  edema control.     Pt will benefit from skilled therapeutic intervention in order to improve on the following deficits Abnormal gait;Decreased activity tolerance;Decreased balance;Decreased range of motion;Decreased strength;Difficulty walking;Pain;Increased edema   Rehab Potential Good   PT Frequency 3x / week   PT Duration 8 weeks   PT Treatment/Interventions Gait training;Stair training;Therapeutic activities;Electrical Stimulation;Cryotherapy;Therapeutic exercise;Balance training;Patient/family education;Manual techniques   PT Next Visit Plan Continue to work on extension and balance.           G-Codes - 04/25/15 1351    Functional Assessment Tool Used walking and moving around    Functional Limitation Mobility: Walking and moving around   Mobility: Walking and Moving Around Current Status [phone]) At least 20 percent but less than 40 percent impaired, limited or restricted   Mobility: Walking and Moving Around Goal Status [phone]) At least 1 percent but less than 20 percent impaired, limited or restricted      Problem List Patient Active Problem List   Diagnosis Date Noted  . Primary localized osteoarthritis of right knee 02/26/2015  . Fecal incontinence 09/24/2014  . Precordial pain 01/18/2014  . Adhesive capsulitis of shoulder 06/15/2013  . Decreased range of motion of right shoulder 05/19/2013  . Muscle weakness (generalized) 05/19/2013  . Diabetic autonomic neuropathy ([place]) 05/20/2012  . Other and unspecified hyperlipidemia 05/20/2012  . Insomnia 05/20/2012  . Essential hypertension, benign 05/20/2012   Ihor Austin, [place]; [place] Rayetta Humphrey, PT CLT [phone] 25-Apr-2015, 1:52 PM  [facility] [address] [place], [place], [address] Phone: [phone]   Fax:  [phone]  Name: Logan Hooper MRN: [id number] Date of Birth: Jul 10, 1945

## 2015-04-05 ENCOUNTER — Ambulatory Visit (HOSPITAL_COMMUNITY): Payer: Medicare Other | Admitting: Physical Therapy

## 2015-04-05 DIAGNOSIS — R2689 Other abnormalities of gait and mobility: Secondary | ICD-10-CM | POA: Diagnosis not present

## 2015-04-05 DIAGNOSIS — M25662 Stiffness of left knee, not elsewhere classified: Secondary | ICD-10-CM | POA: Diagnosis not present

## 2015-04-05 DIAGNOSIS — R269 Unspecified abnormalities of gait and mobility: Secondary | ICD-10-CM

## 2015-04-05 DIAGNOSIS — M6281 Muscle weakness (generalized): Secondary | ICD-10-CM | POA: Diagnosis not present

## 2015-04-05 DIAGNOSIS — R262 Difficulty in walking, not elsewhere classified: Secondary | ICD-10-CM | POA: Diagnosis not present

## 2015-04-05 NOTE — Therapy (Signed)
[place], [place], [address] Phone: [phone]   Fax:  [phone]  Physical Therapy Treatment  Patient Details  Name: Logan Hooper MRN: [id number] Date of Birth: Sep 26, 1945 Referring Provider: Marchia Bond  Encounter Date: 04/05/2015      PT End of Session - 04/05/15 0832    Visit Number 11   Number of Visits 19   Date for PT Re-Evaluation 04/13/15   Authorization - Visit Number 11   Authorization - Number of Visits 19   PT Start Time 0800   PT Stop Time 0843   PT Time Calculation (min) 43 min   Activity Tolerance Patient tolerated treatment well   Behavior During Therapy [facility] for tasks assessed/performed      Past Medical History  Diagnosis Date  . Essential hypertension   . Hyperlipidemia   . Arthritis   . ED (erectile dysfunction)   . Sciatica   . PTSD (post-traumatic stress disorder)   . Shortness of breath dyspnea     with exertion  . Nephrolithiasis     30 % use of right kidney  . Renal insufficiency   . Primary localized osteoarthritis of right knee 02/26/2015  . 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 Back surgery    . Video assisted thoracoscopy (vats)/decortication Left 2009    Dr. Arlyce Dice  -left-sided empyema  . Colonoscopy    . Rotator cuff repair      BILATERAL  . Removal of kidney  stones    . 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 Total knee arthroplasty Right 02/26/2015    Procedure: TOTAL KNEE ARTHROPLASTY;  Surgeon: Marchia Bond, MD;  Location: [facility];  Service: Orthopedics;  Laterality: Right;    There were no vitals filed for this visit.  Visit Diagnosis:  Abnormality of gait  Unstable balance  Stiffness of knee joint, left      Subjective Assessment - 04/05/15 0808    Subjective Pt states that he did a lot of work last night and had some increased pain and  swelling but is doing better this morning.    Pertinent History 2 previous back surgeries    How long can you sit comfortably? 10-15 minutes was 10-15 minutes    How long can you stand comfortably? 20 [place] was 10    How long can you walk comfortably? 15 minutes was 15 minkutes.    Currently in Pain? Yes   Pain Score 1    Pain Location Knee   Pain Orientation Right;Medial   Pain Type Surgical pain   Pain Onset 1 to 4 weeks ago   Pain Frequency Intermittent   Aggravating Factors  twisting   Pain Relieving Factors exercise and moving it            [facility] PT Assessment - 04/05/15 0001    Assessment   Medical Diagnosis Rt total knee replacement   Onset Date/Surgical Date 02/26/15   Next MD Visit 04/10/2015   Prior Therapy home health   Precautions   Precautions None   Restrictions   Weight Bearing Restrictions No   Home Environment   Living Environment Private residence   Type of [place] to enter   Entrance Stairs-Number of Steps 3   Home Layout Laundry or work area in basement   Prior Function  Level of Independence Independent   Vocation Retired   Leisure walking, going to [employer] has condo with 21 steps, race car events    Cognition   Overall Cognitive Status Within Functional Limits for tasks assessed   Observation/Other Assessments   Focus on Therapeutic Outcomes (FOTO)  64  was 68   Functional Tests   Functional tests Single leg stance;Sit to Stand   Single Leg Stance   Comments Rt 9; Lt 12 seconds was Both LE 3 seconds a piece    Sit to Stand   Comments 11.10 was 10.2    AROM   Right Knee Extension 12  was 23    Right Knee Flexion 120  was 105    Strength   Right Hip Flexion 5/5   Right Hip Extension 4+/5  was 4/5    Right Hip ABduction 4+/5  was 4/5    Right Knee Extension 5/5   Right Ankle Dorsiflexion 5/5                     [facility] Adult PT Treatment/Exercise - 04/05/15 0001    Knee/Hip Exercises:  Stretches   Quad Stretch Right;3 reps;30 seconds   Gastroc Stretch 3 reps;30 seconds   Gastroc Stretch Limitations slant board   Knee/Hip Exercises: Standing   Terminal Knee Extension Strengthening;Right;10 reps   SLS 5 x each   Knee/Hip Exercises: Supine   Quad Sets 20 reps   Terminal Knee Extension 15 reps   Bridges with [employer] 15 reps   Other Supine Knee/Hip Exercises 12-120   Manual Therapy   Manual Therapy Soft tissue mobilization   Manual therapy comments all manual done seprerate from all other aspects of treatment.    Soft tissue mobilization for hamstring mm while prone to increase extension                   PT Short Term Goals - 04/05/15 5277    PT SHORT TERM GOAL #1   Title Pt ROM to improve to 10 to 115 degress to allow improve tolerance of sitting for up to 60 minutes for traveling to the beach    Baseline 12-120 on 04/05/2015   Time 4   Period Weeks   Status On-going   PT SHORT TERM GOAL #2   Title Pt single leg stance to be at least 10 seconds to reduce risk of falling    Time 4   Period Weeks   Status On-going   PT SHORT TERM GOAL #3   Title Pt pain to be no greater than a 2/10 to be able to tolerate weightbearing activity for an hour.    Time 4   Period Weeks   Status Achieved   PT SHORT TERM GOAL #4   Title Pt to be independent with a home exercise program to improve ROM    Time 4   Period Weeks   Status Achieved           PT Long Term Goals - 04/05/15 8242    PT LONG TERM GOAL #1   Title Pt ROM to be improved  to 5 to 120 degrees to allow pt  ambulate with a normalized gait pattern   Baseline 12-120   Time 8   Period Weeks   Status Partially Met   PT LONG TERM GOAL #2   Title Pt single leg stance to be improved to 15 seconds to reduce risk of falls .  Time 8   Period Weeks   Status On-going   PT LONG TERM GOAL #3   Title Pt to be able to demonstrate the ability to go up and down 20 steps to be able to transverse steps at  his beach house.    Time 8   Period Weeks   Status Achieved   PT LONG TERM GOAL #4   Title Pt to report that he is able to ambulate cofidently over grass and is  working  in his yard.      Time 8   Period Weeks   Status Achieved               Plan - 04/05/15 0848    Clinical Impression Statement Pt reassessed with current deficits being lacking of extension and balance.  Manual techniques showed medial hamstring mm very tight with fascial restrections.  Pt has improved in strength and flexion.  Pt will benefit from continued skilled thereay with emphasis on improving extension and flexion but can decrease therapy to twice a week at this time.     Pt will benefit from skilled therapeutic intervention in order to improve on the following deficits Decreased activity tolerance;Decreased balance;Decreased range of motion;Pain;Increased edema;Increased fascial restricitons   Rehab Potential Good   PT Frequency 2x / week   PT Duration 4 weeks  Pt will have been seen for a total of 8; 3x 4wk then 2x for 4 weeks    PT Treatment/Interventions Gait training;Stair training;Therapeutic activities;Electrical Stimulation;Cryotherapy;Therapeutic exercise;Balance training;Patient/family education;Manual techniques   PT Next Visit Plan continue manual on medial hamstring; Passive and active hamstring stretches; hamstring stretches in standing, long sitting and supine as well as balance activity.        Problem List Patient Active Problem List   Diagnosis Date Noted  . Primary localized osteoarthritis of right knee 02/26/2015  . Fecal incontinence 09/24/2014  . Precordial pain 01/18/2014  . Adhesive capsulitis of shoulder 06/15/2013  . Decreased range of motion of right shoulder 05/19/2013  . Muscle weakness (generalized) 05/19/2013  . Diabetic autonomic neuropathy ([place]) 05/20/2012  . Other and unspecified hyperlipidemia 05/20/2012  . Insomnia 05/20/2012  . Essential hypertension, benign  05/20/2012    Rayetta Humphrey, PT CLT [phone] 04/05/2015, 8:54 AM  [facility] [address] [place], [place], [address] Phone: [phone]   Fax:  [phone]  Name: Logan Hooper MRN: [id number] Date of Birth: 1945-01-30

## 2015-04-08 ENCOUNTER — Ambulatory Visit (HOSPITAL_COMMUNITY): Payer: Medicare Other | Attending: Orthopedic Surgery | Admitting: Physical Therapy

## 2015-04-08 DIAGNOSIS — M25662 Stiffness of left knee, not elsewhere classified: Secondary | ICD-10-CM | POA: Diagnosis not present

## 2015-04-08 DIAGNOSIS — R2681 Unsteadiness on feet: Secondary | ICD-10-CM | POA: Insufficient documentation

## 2015-04-08 DIAGNOSIS — R2689 Other abnormalities of gait and mobility: Secondary | ICD-10-CM

## 2015-04-08 DIAGNOSIS — R269 Unspecified abnormalities of gait and mobility: Secondary | ICD-10-CM | POA: Diagnosis not present

## 2015-04-08 DIAGNOSIS — M25661 Stiffness of right knee, not elsewhere classified: Secondary | ICD-10-CM | POA: Insufficient documentation

## 2015-04-08 DIAGNOSIS — M6281 Muscle weakness (generalized): Secondary | ICD-10-CM | POA: Diagnosis not present

## 2015-04-08 DIAGNOSIS — R262 Difficulty in walking, not elsewhere classified: Secondary | ICD-10-CM | POA: Diagnosis not present

## 2015-04-08 NOTE — Therapy (Signed)
[facility] [place], [place], [address] Phone: [phone]   Fax:  [phone]  Physical Therapy Treatment  Patient Details  Name: Logan Hooper MRN: [id number] Date of Birth: [birth year] Referring Provider: Marchia Bond  Encounter Date: 04/08/2015      PT End of Session - 04/08/15 1025    Visit Number 12   Number of Visits 19   Date for PT Re-Evaluation 04/13/15   Authorization - Visit Number 12   Authorization - Number of Visits 19   PT Start Time 0935   PT Stop Time 1017   PT Time Calculation (min) 42 min   Activity Tolerance Patient tolerated treatment well   Behavior During Therapy [facility] for tasks assessed/performed      Past Medical History  Diagnosis Date  . Essential hypertension   . Hyperlipidemia   . Arthritis   . ED (erectile dysfunction)   . Sciatica   . PTSD (post-traumatic stress disorder)   . Shortness of breath dyspnea     with exertion  . Nephrolithiasis     30 % use of right kidney  . Renal insufficiency   . Primary localized osteoarthritis of right knee 02/26/2015  . 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 Back surgery    . Video assisted thoracoscopy (vats)/decortication Left 2009    Dr. Arlyce Dice  -left-sided empyema  . Colonoscopy    . Rotator cuff repair      BILATERAL  . Removal of kidney  stones    . 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  Visit Diagnosis:  Abnormality of gait  Unstable balance  Stiffness of knee joint, left  Muscle weakness (generalized)  Difficulty walking down step      Subjective Assessment - 04/08/15 1030    Subjective Pt states he  conitnues to stay stiff but wtihout pain currently.  States he was up alot yesterday working on [employer].  Reports compliance with HEP.   Currently in Pain? No/denies                         [facility] Adult PT Treatment/Exercise - 04/08/15 0001    Knee/Hip Exercises: Stretches   Active Hamstring Stretch 3 reps;30 seconds   Active Hamstring Stretch Limitations 12" Rt LE   Gastroc Stretch 3 reps;30 seconds   Gastroc Stretch Limitations slant board   Knee/Hip Exercises: Standing   Lateral Step Up Right;15 reps;Step Height: 6";Hand Hold: 1   Forward Step Up Right;1 set;Step Height: 6";Hand Hold: 1;15 reps   Step Down 10 reps;Step Height: 6";Hand Hold: 2   SLS 5 x Rt 15" max   SLS with Vectors 10 X 5" Rt LE   Other Standing Knee Exercises tandem stance 2x 30"   Other Standing Knee Exercises tandem gait 1RT   Manual Therapy   Manual Therapy Soft tissue mobilization;Myofascial release   Manual therapy comments all manual done seprerate from all other aspects of treatment.    Soft tissue mobilization for hamstring mm while prone to increase extension    Myofascial Release to perimeter of knee anteriorly and posteriorly  Passive ROM knee extension                  PT Short Term Goals - 04/05/15 4097    PT SHORT TERM GOAL #1   Title Pt ROM to improve to 10 to 115 degress to allow improve tolerance of sitting for up to 60 minutes for traveling to the beach    Baseline 12-120 on 04/05/2015   Time 4   Period Weeks   Status On-going   PT SHORT TERM GOAL #2   Title Pt single leg stance to be at least 10 seconds to reduce risk of falling    Time 4   Period Weeks   Status On-going   PT SHORT TERM GOAL #3   Title Pt pain to be no greater than a 2/10 to be able to tolerate weightbearing activity for an hour.    Time 4   Period Weeks   Status Achieved   PT SHORT TERM GOAL #4   Title Pt to be independent with a home exercise program to improve ROM    Time 4    Period Weeks   Status Achieved           PT Long Term Goals - 04/05/15 3532    PT LONG TERM GOAL #1   Title Pt ROM to be improved  to 5 to 120 degrees to allow pt  ambulate with a normalized gait pattern   Baseline 12-120   Time 8   Period Weeks   Status Partially Met   PT LONG TERM GOAL #2   Title Pt single leg stance to be improved to 15 seconds to reduce risk of falls .   Time 8   Period Weeks   Status On-going   PT LONG TERM GOAL #3   Title Pt to be able to demonstrate the ability to go up and down 20 steps to be able to transverse steps at his beach house.    Time 8   Period Weeks   Status Achieved   PT LONG TERM GOAL #4   Title Pt to report that he is able to ambulate cofidently over grass and is  working  in his yard.      Time 8   Period Weeks   Status Achieved               Plan - 04/08/15 1026    Clinical Impression Statement Continued with focus on improving knee extension and balance.  progressed with SLS vectors and added tandem gait.  Pt with improved balance completing tandem without looking down at feet,  increased SLS time today with 15 seconds max on the Rt LE.  Cotinued tightness in hamstring musculature but able to decrease with myofascial techniques.  Also completed manual to medial and lateral anterior knee to promote increased ROM.  Pt reported overall improvement in mobility without increased pain at end of session.    Pt will benefit from skilled therapeutic intervention in order to improve on the following deficits Decreased activity tolerance;Decreased balance;Decreased range of motion;Pain;Increased edema;Increased fascial restricitons   Rehab Potential Good   PT Frequency 2x / week   PT Duration 4 weeks  Pt will have been seen for a total of 8; 3x 4wk then 2x for 4 weeks    PT Treatment/Interventions Gait training;Stair training;Therapeutic activities;Electrical Stimulation;Cryotherapy;Therapeutic exercise;Balance training;Patient/family  education;Manual techniques   PT Next Visit Plan continue manual on medial hamstring; Passive and active hamstring stretches; hamstring  stretches in standing, long sitting and supine as well as balance activity.        Problem List Patient Active Problem List   Diagnosis Date Noted  . Primary localized osteoarthritis of right knee 02/26/2015  . Fecal incontinence 09/24/2014  . Precordial pain 01/18/2014  . Adhesive capsulitis of shoulder 06/15/2013  . Decreased range of motion of right shoulder 05/19/2013  . Muscle weakness (generalized) 05/19/2013  . Diabetic autonomic neuropathy ([place]) 05/20/2012  . Other and unspecified hyperlipidemia 05/20/2012  . Insomnia 05/20/2012  . Essential hypertension, benign 05/20/2012    Teena Irani, PTA/CLT [phone] 04/08/2015, 10:35 AM  [facility] [address] [place], [place], [address] Phone: [phone]   Fax:  [phone]  Name: Logan Hooper MRN: [id number] Date of Birth: 1945/06/16

## 2015-04-10 ENCOUNTER — Encounter (HOSPITAL_COMMUNITY): Payer: Medicare Other

## 2015-04-10 DIAGNOSIS — M1711 Unilateral primary osteoarthritis, right knee: Secondary | ICD-10-CM | POA: Diagnosis not present

## 2015-04-12 ENCOUNTER — Ambulatory Visit (HOSPITAL_COMMUNITY): Payer: Medicare Other

## 2015-04-12 DIAGNOSIS — M25662 Stiffness of left knee, not elsewhere classified: Secondary | ICD-10-CM | POA: Diagnosis not present

## 2015-04-12 DIAGNOSIS — M6281 Muscle weakness (generalized): Secondary | ICD-10-CM | POA: Diagnosis not present

## 2015-04-12 DIAGNOSIS — R2681 Unsteadiness on feet: Secondary | ICD-10-CM | POA: Diagnosis not present

## 2015-04-12 DIAGNOSIS — R262 Difficulty in walking, not elsewhere classified: Secondary | ICD-10-CM | POA: Diagnosis not present

## 2015-04-12 DIAGNOSIS — M25661 Stiffness of right knee, not elsewhere classified: Secondary | ICD-10-CM

## 2015-04-12 DIAGNOSIS — R2689 Other abnormalities of gait and mobility: Secondary | ICD-10-CM | POA: Diagnosis not present

## 2015-04-12 DIAGNOSIS — R269 Unspecified abnormalities of gait and mobility: Secondary | ICD-10-CM | POA: Diagnosis not present

## 2015-04-12 NOTE — Therapy (Signed)
[place], [place], [address] Phone: [phone]   Fax:  [phone]  Physical Therapy Treatment  Patient Details  Name: Logan Hooper MRN: [id number] Date of Birth: Jun 02, 1945 Referring Provider: Marchia Bond  Encounter Date: 04/12/2015      PT End of Session - 04/12/15 1027    Visit Number 13   Number of Visits 19   Date for PT Re-Evaluation 04/13/15   Authorization Type medicare   Authorization - Visit Number 13   Authorization - Number of Visits 19   PT Start Time 1022   PT Stop Time 1103   PT Time Calculation (min) 41 min   Activity Tolerance Patient tolerated treatment well   Behavior During Therapy [facility] for tasks assessed/performed      Past Medical History  Diagnosis Date  . Essential hypertension   . Hyperlipidemia   . Arthritis   . ED (erectile dysfunction)   . Sciatica   . PTSD (post-traumatic stress disorder)   . Shortness of breath dyspnea     with exertion  . Nephrolithiasis     30 % use of right kidney  . Renal insufficiency   . Primary localized osteoarthritis of right knee 02/26/2015  . 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 Back surgery    . Video assisted thoracoscopy (vats)/decortication Left 2009    Dr. Arlyce Dice  -left-sided empyema  . Colonoscopy    . Rotator cuff repair      BILATERAL  . Removal of kidney  stones    . 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      Subjective Assessment - 04/12/15 1024    Subjective Pt stated he has some discomfort this morning on medial aspect, feeling good today just a little stiff currently.  Went to MD yesterday, stated  happy with knee progress will return in 4 weeks.  Stated he worked on balance this morning and continues with HEP   Pertinent History 2 previous back surgeries    Patient Stated Goals Pt is ambulating without any assistive device; he wants his pain to be lower and improved motion    Currently in Pain? No/denies   Pain Descriptors / Indicators Tender;Tightness  stiffness            [facility] Adult PT Treatment/Exercise - 04/12/15 0001    Knee/Hip Exercises: Stretches   Active Hamstring Stretch Right   Active Hamstring Stretch Limitations BLE standing 12 in step, Rt only long sitting and supine 3x 30" holds   Quad Stretch Right;3 reps;30 seconds   Gastroc Stretch 3 reps;30 seconds   Gastroc Stretch Limitations slant board   Knee/Hip Exercises: Standing   SLS Rt 10", Lt 8" max of 5   SLS with Vectors 10 X 5" Rt LE   Other Standing Knee Exercises tandem gait 1RT   Knee/Hip Exercises: Supine   Heel Slides Right;1 set;10 reps   Heel Slides Limitations 8-121   Knee Extension PROM   Knee Extension Limitations supine 3x 30" holds with pt. counting outload to reduce holding breath during PROM   Other Supine Knee/Hip Exercises 8-121   Manual  Therapy   Manual Therapy Soft tissue mobilization;Myofascial release   Manual therapy comments all manual done seprerate from all other aspects of treatment.    Soft tissue mobilization for hamstring mm while prone to increase extension    Myofascial Release to perimeter of knee anteriorly and posteriorly   Passive ROM knee extension                  PT Short Term Goals - 04/05/15 2841    PT SHORT TERM GOAL #1   Title Pt ROM to improve to 10 to 115 degress to allow improve tolerance of sitting for up to 60 minutes for traveling to the beach    Baseline 12-120 on 04/05/2015   Time 4   Period Weeks   Status On-going   PT SHORT TERM GOAL #2   Title Pt single leg stance to be at least 10 seconds to reduce risk of falling    Time 4   Period  Weeks   Status On-going   PT SHORT TERM GOAL #3   Title Pt pain to be no greater than a 2/10 to be able to tolerate weightbearing activity for an hour.    Time 4   Period Weeks   Status Achieved   PT SHORT TERM GOAL #4   Title Pt to be independent with a home exercise program to improve ROM    Time 4   Period Weeks   Status Achieved           PT Long Term Goals - 04/05/15 3244    PT LONG TERM GOAL #1   Title Pt ROM to be improved  to 5 to 120 degrees to allow pt  ambulate with a normalized gait pattern   Baseline 12-120   Time 8   Period Weeks   Status Partially Met   PT LONG TERM GOAL #2   Title Pt single leg stance to be improved to 15 seconds to reduce risk of falls .   Time 8   Period Weeks   Status On-going   PT LONG TERM GOAL #3   Title Pt to be able to demonstrate the ability to go up and down 20 steps to be able to transverse steps at his beach house.    Time 8   Period Weeks   Status Achieved   PT LONG TERM GOAL #4   Title Pt to report that he is able to ambulate cofidently over grass and is  working  in his yard.      Time 8   Period Weeks   Status Achieved               Plan - 04/12/15 1107    Clinical Impression Statement Session focus on improving knee extension and balance training.  Continued active stretches to hamstrings in multiple positions as well as gastroc and quads to reduce tightness.  Manual technqiues complete to medial hamstrings and proximal knee musculature to reduce tightess to increase AROM.  AROM at end of session at 8-121 degrees.  No reports of pain through session.  Pt does continue to demonstrate balance impairements, ability to Rt SLS 10" max of 5 attempts.   Rehab Potential Good   PT Frequency 2x / week   PT Duration 4 weeks   PT Treatment/Interventions Gait training;Stair training;Therapeutic activities;Electrical Stimulation;Cryotherapy;Therapeutic exercise;Balance training;Patient/family education;Manual techniques   PT  Next Visit Plan continue manual on medial hamstring; Passive and active hamstring stretches; hamstring stretches in  standing, long sitting and supine as well as balance activity.      Patient will benefit from skilled therapeutic intervention in order to improve the following deficits and impairments:  Decreased activity tolerance, Decreased balance, Decreased range of motion, Pain, Increased edema, Increased fascial restricitons  Visit Diagnosis: Other abnormalities of gait and mobility  Unsteadiness on feet  Stiffness of right knee, not elsewhere classified  Muscle weakness (generalized)     Problem List Patient Active Problem List   Diagnosis Date Noted  . Primary localized osteoarthritis of right knee 02/26/2015  . Fecal incontinence 09/24/2014  . Precordial pain 01/18/2014  . Adhesive capsulitis of shoulder 06/15/2013  . Decreased range of motion of right shoulder 05/19/2013  . Muscle weakness (generalized) 05/19/2013  . Diabetic autonomic neuropathy ([place]) 05/20/2012  . Other and unspecified hyperlipidemia 05/20/2012  . Insomnia 05/20/2012  . Essential hypertension, benign 05/20/2012   Ihor Austin, [place]; [place]   Aldona Lento 04/12/2015, 6:40 PM  [facility] [address] [place], [place], [address] Phone: [phone]   Fax:  [phone]  Name: Logan Hooper MRN: [id number] Date of Birth: [birth year]

## 2015-04-15 ENCOUNTER — Ambulatory Visit (HOSPITAL_COMMUNITY): Payer: Medicare Other | Admitting: Physical Therapy

## 2015-04-15 DIAGNOSIS — M25661 Stiffness of right knee, not elsewhere classified: Secondary | ICD-10-CM

## 2015-04-15 DIAGNOSIS — R2681 Unsteadiness on feet: Secondary | ICD-10-CM | POA: Diagnosis not present

## 2015-04-15 DIAGNOSIS — R2689 Other abnormalities of gait and mobility: Secondary | ICD-10-CM

## 2015-04-15 DIAGNOSIS — R269 Unspecified abnormalities of gait and mobility: Secondary | ICD-10-CM | POA: Diagnosis not present

## 2015-04-15 DIAGNOSIS — M6281 Muscle weakness (generalized): Secondary | ICD-10-CM | POA: Diagnosis not present

## 2015-04-15 DIAGNOSIS — M25662 Stiffness of left knee, not elsewhere classified: Secondary | ICD-10-CM | POA: Diagnosis not present

## 2015-04-15 DIAGNOSIS — R262 Difficulty in walking, not elsewhere classified: Secondary | ICD-10-CM | POA: Diagnosis not present

## 2015-04-15 NOTE — Therapy (Signed)
[place], [place], [address] Phone: [phone]   Fax:  [phone]  Physical Therapy Treatment  Patient Details  Name: Logan Hooper MRN: [id number] Date of Birth: Apr 20, 1945 Referring Provider: Marchia Bond  Encounter Date: 04/15/2015      PT End of Session - 04/15/15 1420    Visit Number 14   Number of Visits 19   Date for PT Re-Evaluation 04/13/15   Authorization Type medicare   Authorization - Visit Number 14   Authorization - Number of Visits 19   PT Start Time 0945   PT Stop Time 1015   PT Time Calculation (min) 30 min   Activity Tolerance Patient tolerated treatment well   Behavior During Therapy [facility] for tasks assessed/performed      Past Medical History  Diagnosis Date  . Essential hypertension   . Hyperlipidemia   . Arthritis   . ED (erectile dysfunction)   . Sciatica   . PTSD (post-traumatic stress disorder)   . Shortness of breath dyspnea     with exertion  . Nephrolithiasis     30 % use of right kidney  . Renal insufficiency   . Primary localized osteoarthritis of right knee 02/26/2015  . 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 Back surgery    . Video assisted thoracoscopy (vats)/decortication Left 2009    Dr. Arlyce Dice  -left-sided empyema  . Colonoscopy    . Rotator cuff repair      BILATERAL  . Removal of kidney  stones    . 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      Subjective Assessment - 04/15/15 0949    Subjective PT states it hurt a little yesterday but no pain today.     Currently in Pain? No/denies                         [facility] Adult  PT Treatment/Exercise - 04/15/15 0954    Knee/Hip Exercises: Stretches   Active Hamstring Stretch Right   Active Hamstring Stretch Limitations BLE standing 12 in step, Rt only long sitting and supine 3x 30" holds   Gastroc Stretch 3 reps;30 seconds   Gastroc Stretch Limitations slant board   Knee/Hip Exercises: Standing   Lateral Step Up Right;15 reps;Step Height: 6";Hand Hold: 1   Forward Step Up Right;1 set;Step Height: 6";Hand Hold: 1;15 reps   Step Down Right;15 reps;Step Height: 6";Hand Hold: 1   SLS with Vectors 10 X 5" Rt LE   Other Standing Knee Exercises tandem gait 1RT   Manual Therapy   Manual Therapy Myofascial release   Manual therapy comments all manual done seprerate from all other aspects of treatment.    Myofascial Release to perimeter of knee anteriorly and posteriorly   Passive ROM knee extension                  PT Short Term Goals - 04/05/15 8786    PT SHORT TERM GOAL #1   Title Pt ROM to improve to 10 to 115 degress to allow improve tolerance of sitting for up to 60  minutes for traveling to the beach    Baseline 12-120 on 04/05/2015   Time 4   Period Weeks   Status On-going   PT SHORT TERM GOAL #2   Title Pt single leg stance to be at least 10 seconds to reduce risk of falling    Time 4   Period Weeks   Status On-going   PT SHORT TERM GOAL #3   Title Pt pain to be no greater than a 2/10 to be able to tolerate weightbearing activity for an hour.    Time 4   Period Weeks   Status Achieved   PT SHORT TERM GOAL #4   Title Pt to be independent with a home exercise program to improve ROM    Time 4   Period Weeks   Status Achieved           PT Long Term Goals - 04/05/15 1856    PT LONG TERM GOAL #1   Title Pt ROM to be improved  to 5 to 120 degrees to allow pt  ambulate with a normalized gait pattern   Baseline 12-120   Time 8   Period Weeks   Status Partially Met   PT LONG TERM GOAL #2   Title Pt single leg stance to be improved to 15  seconds to reduce risk of falls .   Time 8   Period Weeks   Status On-going   PT LONG TERM GOAL #3   Title Pt to be able to demonstrate the ability to go up and down 20 steps to be able to transverse steps at his beach house.    Time 8   Period Weeks   Status Achieved   PT LONG TERM GOAL #4   Title Pt to report that he is able to ambulate cofidently over grass and is  working  in his yard.      Time 8   Period Weeks   Status Achieved               Plan - 04/15/15 1421    Clinical Impression Statement continued focus on improving rt knee extension and balance.  Stability in Rt LE limited with addition of SLS vectors and tandem gait today.  Pt wtih multiple LOB with tandem gait, however able to self correct.  therapist facilitation to complete all therex in correct form.  Most adhesions in medial Rt knee but able to reduce with manual techniques.  Improved tolerance for PROM as well.     Rehab Potential Good   PT Frequency 2x / week   PT Duration 4 weeks   PT Treatment/Interventions Gait training;Stair training;Therapeutic activities;Electrical Stimulation;Cryotherapy;Therapeutic exercise;Balance training;Patient/family education;Manual techniques   PT Next Visit Plan continue manual on medial hamstring; Passive and active hamstring stretches; hamstring stretches in standing, long sitting and supine as well as balance activity.      Patient will benefit from skilled therapeutic intervention in order to improve the following deficits and impairments:  Decreased activity tolerance, Decreased balance, Decreased range of motion, Pain, Increased edema, Increased fascial restricitons  Visit Diagnosis: Other abnormalities of gait and mobility  Unsteadiness on feet  Stiffness of right knee, not elsewhere classified  Muscle weakness (generalized)     Problem List Patient Active Problem List   Diagnosis Date Noted  . Primary localized osteoarthritis of right knee 02/26/2015  .  Fecal incontinence 09/24/2014  . Precordial pain 01/18/2014  . Adhesive capsulitis of shoulder 06/15/2013  . Decreased range  of motion of right shoulder 05/19/2013  . Muscle weakness (generalized) 05/19/2013  . Diabetic autonomic neuropathy ([place]) 05/20/2012  . Other and unspecified hyperlipidemia 05/20/2012  . Insomnia 05/20/2012  . Essential hypertension, benign 05/20/2012    Teena Irani, PTA/CLT [phone]  04/15/2015, 2:26 PM  [facility] [address] [place], [place], [address] Phone: [phone]   Fax:  [phone]  Name: Logan Hooper MRN: [id number] Date of Birth: 1945-12-29

## 2015-04-17 ENCOUNTER — Ambulatory Visit (HOSPITAL_COMMUNITY): Payer: Medicare Other | Admitting: Physical Therapy

## 2015-04-17 DIAGNOSIS — R2689 Other abnormalities of gait and mobility: Secondary | ICD-10-CM

## 2015-04-17 DIAGNOSIS — R262 Difficulty in walking, not elsewhere classified: Secondary | ICD-10-CM | POA: Diagnosis not present

## 2015-04-17 DIAGNOSIS — M6281 Muscle weakness (generalized): Secondary | ICD-10-CM

## 2015-04-17 DIAGNOSIS — M25661 Stiffness of right knee, not elsewhere classified: Secondary | ICD-10-CM

## 2015-04-17 DIAGNOSIS — R269 Unspecified abnormalities of gait and mobility: Secondary | ICD-10-CM | POA: Diagnosis not present

## 2015-04-17 DIAGNOSIS — R2681 Unsteadiness on feet: Secondary | ICD-10-CM

## 2015-04-17 DIAGNOSIS — M25662 Stiffness of left knee, not elsewhere classified: Secondary | ICD-10-CM | POA: Diagnosis not present

## 2015-04-17 NOTE — Therapy (Signed)
[place], [place], [address] Phone: [phone]   Fax:  [phone]  Physical Therapy Treatment  Patient Details  Name: Logan Hooper MRN: [id number] Date of Birth: 1945/12/20 Referring Provider: Marchia Bond  Encounter Date: 04/17/2015      PT End of Session - 04/17/15 1030    Visit Number 15   Number of Visits 19   Date for PT Re-Evaluation 05/05/15   Authorization Type medicare   Authorization - Visit Number 15   Authorization - Number of Visits 19   PT Start Time 0950   PT Stop Time 1034   PT Time Calculation (min) 44 min   Activity Tolerance Patient tolerated treatment well   Behavior During Therapy [facility] for tasks assessed/performed      Past Medical History  Diagnosis Date  . Essential hypertension   . Hyperlipidemia   . Arthritis   . ED (erectile dysfunction)   . Sciatica   . PTSD (post-traumatic stress disorder)   . Shortness of breath dyspnea     with exertion  . Nephrolithiasis     30 % use of right kidney  . Renal insufficiency   . Primary localized osteoarthritis of right knee 02/26/2015  . 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 Back surgery    . Video assisted thoracoscopy (vats)/decortication Left 2009    Dr. Arlyce Dice  -left-sided empyema  . Colonoscopy    . Rotator cuff repair      BILATERAL  . Removal of kidney  stones    . 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      Subjective Assessment - 04/17/15 0951    Subjective Pt states that he is going to the beach tomorrow.  No pain   Currently in Pain? No/denies                         [facility]  Adult PT Treatment/Exercise - 04/17/15 0001    Knee/Hip Exercises: Stretches   Active Hamstring Stretch Right   Passive Hamstring Stretch Right;1 rep;60 seconds   Passive Hamstring Stretch Limitations both long sitting at table as well as supine with therapist    Gastroc Stretch 2 reps;60 seconds   Gastroc Stretch Limitations slant board   Other Knee/Hip Stretches back extension to stretch hip flexors    Knee/Hip Exercises: Supine   Quad Sets Right;20 reps   Quad Sets Limitations 10 at end of sessions    Terminal Knee Extension 15 reps   Knee Extension PROM;5 sets;20 reps   Other Supine Knee/Hip Exercises PROM 11-121; AROM 15    Knee/Hip Exercises: Prone   Prone Knee Hang 5 minutes   Prone Knee Hang Limitations terminal extension x 10    Manual Therapy   Manual Therapy Soft tissue mobilization;Myofascial release   Manual therapy comments all manual done seprerate from all other aspects of treatment.    Soft tissue mobilization for hamstring mm while prone to increase extension    Passive ROM knee extension       IP for five minutes after treatment with no charge.  PT Short Term Goals - 04/05/15 9767    PT SHORT TERM GOAL #1   Title Pt ROM to improve to 10 to 115 degress to allow improve tolerance of sitting for up to 60 minutes for traveling to the beach    Baseline 12-120 on 04/05/2015   Time 4   Period Weeks   Status On-going   PT SHORT TERM GOAL #2   Title Pt single leg stance to be at least 10 seconds to reduce risk of falling    Time 4   Period Weeks   Status On-going   PT SHORT TERM GOAL #3   Title Pt pain to be no greater than a 2/10 to be able to tolerate weightbearing activity for an hour.    Time 4   Period Weeks   Status Achieved   PT SHORT TERM GOAL #4   Title Pt to be independent with a home exercise program to improve ROM    Time 4   Period Weeks   Status Achieved           PT Long Term Goals - 04/05/15 3419    PT LONG TERM GOAL  #1   Title Pt ROM to be improved  to 5 to 120 degrees to allow pt  ambulate with a normalized gait pattern   Baseline 12-120   Time 8   Period Weeks   Status Partially Met   PT LONG TERM GOAL #2   Title Pt single leg stance to be improved to 15 seconds to reduce risk of falls .   Time 8   Period Weeks   Status On-going   PT LONG TERM GOAL #3   Title Pt to be able to demonstrate the ability to go up and down 20 steps to be able to transverse steps at his beach house.    Time 8   Period Weeks   Status Achieved   PT LONG TERM GOAL #4   Title Pt to report that he is able to ambulate cofidently over grass and is  working  in his yard.      Time 8   Period Weeks   Status Achieved               Plan - 04/17/15 1031    Clinical Impression Statement Pt treatment focused on improving extension with manual and exercises.  Pt able to tolerate greater PROM.  Pt requested ice at end of session.  Pt will be seen for four more treatments with emphasis on increasing active extension    Rehab Potential Good   PT Frequency 2x / week   PT Duration 4 weeks   PT Treatment/Interventions Gait training;Stair training;Therapeutic activities;Electrical Stimulation;Cryotherapy;Therapeutic exercise;Balance training;Patient/family education;Manual techniques   PT Next Visit Plan add terminal extension with ball back into program.  Focus on improving extension.       Patient will benefit from skilled therapeutic intervention in order to improve the following deficits and impairments:  Decreased activity tolerance, Decreased balance, Decreased range of motion, Pain, Increased edema, Increased fascial restricitons  Visit Diagnosis: Other abnormalities of gait and mobility  Unsteadiness on feet  Muscle weakness (generalized)  Stiffness of right knee, not elsewhere classified     Problem List Patient Active Problem List   Diagnosis Date Noted  . Primary localized osteoarthritis of right knee  02/26/2015  . Fecal incontinence 09/24/2014  . Precordial pain 01/18/2014  . Adhesive capsulitis of shoulder 06/15/2013  . Decreased range of  motion of right shoulder 05/19/2013  . Muscle weakness (generalized) 05/19/2013  . Diabetic autonomic neuropathy ([place]) 05/20/2012  . Other and unspecified hyperlipidemia 05/20/2012  . Insomnia 05/20/2012  . Essential hypertension, benign 05/20/2012    Rayetta Humphrey, PT CLT [phone] 04/17/2015, 10:36 AM  [facility] [address] [place], [place], [address] Phone: [phone]   Fax:  [phone]  Name: Logan Hooper MRN: [id number] Date of Birth: [birth year]

## 2015-04-22 ENCOUNTER — Encounter (HOSPITAL_COMMUNITY): Payer: Medicare Other | Admitting: Physical Therapy

## 2015-04-24 ENCOUNTER — Ambulatory Visit (HOSPITAL_COMMUNITY): Payer: Medicare Other | Admitting: Physical Therapy

## 2015-04-24 DIAGNOSIS — R262 Difficulty in walking, not elsewhere classified: Secondary | ICD-10-CM | POA: Diagnosis not present

## 2015-04-24 DIAGNOSIS — R2689 Other abnormalities of gait and mobility: Secondary | ICD-10-CM | POA: Diagnosis not present

## 2015-04-24 DIAGNOSIS — R269 Unspecified abnormalities of gait and mobility: Secondary | ICD-10-CM | POA: Diagnosis not present

## 2015-04-24 DIAGNOSIS — M6281 Muscle weakness (generalized): Secondary | ICD-10-CM

## 2015-04-24 DIAGNOSIS — R2681 Unsteadiness on feet: Secondary | ICD-10-CM | POA: Diagnosis not present

## 2015-04-24 DIAGNOSIS — M25661 Stiffness of right knee, not elsewhere classified: Secondary | ICD-10-CM

## 2015-04-24 DIAGNOSIS — M25662 Stiffness of left knee, not elsewhere classified: Secondary | ICD-10-CM | POA: Diagnosis not present

## 2015-04-24 NOTE — Therapy (Signed)
[place], [place], [address] Phone: [phone]   Fax:  [phone]  Physical Therapy Treatment  Patient Details  Name: Logan Hooper MRN: [id number] Date of Birth: 11/09/1945 Referring Provider: Marchia Bond  Encounter Date: 04/24/2015      PT End of Session - 04/24/15 1035    Visit Number 16   Number of Visits 18   Date for PT Re-Evaluation 05/05/15   Authorization Type medicare   Authorization - Visit Number 16   Authorization - Number of Visits 18   PT Start Time 0950   PT Stop Time 1040   PT Time Calculation (min) 50 min   Activity Tolerance Patient tolerated treatment well      Past Medical History  Diagnosis Date  . Essential hypertension   . Hyperlipidemia   . Arthritis   . ED (erectile dysfunction)   . Sciatica   . PTSD (post-traumatic stress disorder)   . Shortness of breath dyspnea     with exertion  . Nephrolithiasis     30 % use of right kidney  . Renal insufficiency   . Primary localized osteoarthritis of right knee 02/26/2015  . 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 Back surgery    . Video assisted thoracoscopy (vats)/decortication Left 2009    Dr. Arlyce Dice  -left-sided empyema  . Colonoscopy    . Rotator cuff repair      BILATERAL  . Removal of kidney  stones    . 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      Subjective Assessment - 04/24/15 0953    Subjective Pt states that he walked a lot at the beach and then he came back without stopping which made his knee feel stiff.    Pertinent History 2 previous back surgeries    Currently in Pain? No/denies                          [facility] Adult PT Treatment/Exercise - 04/24/15 0001    Knee/Hip Exercises: Stretches   Active Hamstring Stretch Right;3 reps;30 seconds   Active Hamstring Stretch Limitations supine    Passive Hamstring Stretch Right;1 rep;60 seconds   Passive Hamstring Stretch Limitations 12" box    Gastroc Stretch 2 reps;60 seconds   Gastroc Stretch Limitations slant board   Other Knee/Hip Stretches Rt plantar fascia stretch 30"  3    Knee/Hip Exercises: Standing   Terminal Knee Extension Strengthening;Right;10 reps   Knee/Hip Exercises: Supine   Quad Sets Strengthening;15 reps   Terminal Knee Extension Strengthening;15 reps   Knee Extension PROM;5 sets;20 reps   Knee/Hip Exercises: Prone   Prone Knee Hang Limitations terminal extension x 10    Cryotherapy   Number Minutes Cryotherapy 10 Minutes  done at end with no charge    Cryotherapy Location Knee   Type of Cryotherapy Ice pack   Manual Therapy   Manual Therapy Soft tissue mobilization;Myofascial release   Manual therapy comments all manual done seprerate from all other aspects of treatment.    Soft tissue mobilization for hamstring mm while prone to increase  extension                   PT Short Term Goals - 04/05/15 9678    PT SHORT TERM GOAL #1   Title Pt ROM to improve to 10 to 115 degress to allow improve tolerance of sitting for up to 60 minutes for traveling to the beach    Baseline 12-120 on 04/05/2015   Time 4   Period Weeks   Status On-going   PT SHORT TERM GOAL #2   Title Pt single leg stance to be at least 10 seconds to reduce risk of falling    Time 4   Period Weeks   Status On-going   PT SHORT TERM GOAL #3   Title Pt pain to be no greater than a 2/10 to be able to tolerate weightbearing activity for an hour.    Time 4   Period Weeks   Status Achieved   PT SHORT TERM GOAL #4   Title Pt to be independent with a home exercise program to improve ROM    Time 4   Period  Weeks   Status Achieved           PT Long Term Goals - 04/05/15 9381    PT LONG TERM GOAL #1   Title Pt ROM to be improved  to 5 to 120 degrees to allow pt  ambulate with a normalized gait pattern   Baseline 12-120   Time 8   Period Weeks   Status Partially Met   PT LONG TERM GOAL #2   Title Pt single leg stance to be improved to 15 seconds to reduce risk of falls .   Time 8   Period Weeks   Status On-going   PT LONG TERM GOAL #3   Title Pt to be able to demonstrate the ability to go up and down 20 steps to be able to transverse steps at his beach house.    Time 8   Period Weeks   Status Achieved   PT LONG TERM GOAL #4   Title Pt to report that he is able to ambulate cofidently over grass and is  working  in his yard.      Time 8   Period Weeks   Status Achieved               Plan - 04/24/15 1035    Clinical Impression Statement Pt treatment focused on improving extension.  Pt able to tolerate PROM well.  Noted fascial restrictions with STM.     PT Next Visit Plan Continue to focus on regaining extension       Patient will benefit from skilled therapeutic intervention in order to improve the following deficits and impairments:     Visit Diagnosis: Other abnormalities of gait and mobility  Unsteadiness on feet  Muscle weakness (generalized)  Stiffness of right knee, not elsewhere classified     Problem List Patient Active Problem List   Diagnosis Date Noted  . Primary localized osteoarthritis of right knee 02/26/2015  . Fecal incontinence 09/24/2014  . Precordial pain 01/18/2014  . Adhesive capsulitis of shoulder 06/15/2013  . Decreased range of motion of right shoulder 05/19/2013  . Muscle weakness (generalized) 05/19/2013  . Diabetic autonomic neuropathy ([place]) 05/20/2012  . Other and unspecified hyperlipidemia 05/20/2012  . Insomnia 05/20/2012  . Essential hypertension, benign 05/20/2012   Rayetta Humphrey, PT CLT [phone] 04/24/2015,  10:38 AM  [place], [place], [address] Phone: [phone]   Fax:  [phone]  Name: CAMILO MANDER MRN: [id number] Date of Birth: 11/20/1945

## 2015-04-29 ENCOUNTER — Ambulatory Visit (HOSPITAL_COMMUNITY): Payer: Medicare Other | Admitting: Physical Therapy

## 2015-04-29 DIAGNOSIS — M6281 Muscle weakness (generalized): Secondary | ICD-10-CM | POA: Diagnosis not present

## 2015-04-29 DIAGNOSIS — M25662 Stiffness of left knee, not elsewhere classified: Secondary | ICD-10-CM | POA: Diagnosis not present

## 2015-04-29 DIAGNOSIS — R262 Difficulty in walking, not elsewhere classified: Secondary | ICD-10-CM | POA: Diagnosis not present

## 2015-04-29 DIAGNOSIS — M25661 Stiffness of right knee, not elsewhere classified: Secondary | ICD-10-CM

## 2015-04-29 DIAGNOSIS — R2681 Unsteadiness on feet: Secondary | ICD-10-CM

## 2015-04-29 DIAGNOSIS — R269 Unspecified abnormalities of gait and mobility: Secondary | ICD-10-CM | POA: Diagnosis not present

## 2015-04-29 DIAGNOSIS — R2689 Other abnormalities of gait and mobility: Secondary | ICD-10-CM

## 2015-04-29 NOTE — Therapy (Signed)
[place], [place], [address] Phone: [phone]   Fax:  [phone]  Physical Therapy Treatment  Patient Details  Name: Logan Hooper MRN: [id number] Date of Birth: 1945-03-29 Referring Provider: Marchia Bond  Encounter Date: 04/29/2015      PT End of Session - 04/29/15 1028    Visit Number 17   Number of Visits 18   Date for PT Re-Evaluation 05/05/15   Authorization Type medicare   Authorization - Visit Number 17   Authorization - Number of Visits 18   PT Start Time 0950   PT Stop Time 1031   PT Time Calculation (min) 41 min   Activity Tolerance Patient tolerated treatment well   Behavior During Therapy [facility] for tasks assessed/performed      Past Medical History  Diagnosis Date  . Essential hypertension   . Hyperlipidemia   . Arthritis   . ED (erectile dysfunction)   . Sciatica   . PTSD (post-traumatic stress disorder)   . Shortness of breath dyspnea     with exertion  . Nephrolithiasis     30 % use of right kidney  . Renal insufficiency   . Primary localized osteoarthritis of right knee 02/26/2015  . 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 Back surgery    . Video assisted thoracoscopy (vats)/decortication Left 2009    Dr. Arlyce Dice  -left-sided empyema  . Colonoscopy    . Rotator cuff repair      BILATERAL  . Removal of kidney  stones    . 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      Subjective Assessment - 04/29/15 0947    Subjective Logan Hooper states that his knee has been stiff but the stiffness does seem to be decreasing.  He goes to the MD on May the 3 rd.     Currently  in Pain? No/denies                         [facility] Adult PT Treatment/Exercise - 04/29/15 0001    Knee/Hip Exercises: Stretches   Active Hamstring Stretch Right;3 reps;30 seconds   Active Hamstring Stretch Limitations supine    Passive Hamstring Stretch Right;1 rep;60 seconds   Passive Hamstring Stretch Limitations 12" box    Gastroc Stretch 2 reps;60 seconds   Gastroc Stretch Limitations slant board   Other Knee/Hip Stretches Rt plantar fascia stretch 30"  3    Knee/Hip Exercises: Standing   Heel Raises Both;10 reps   Terminal Knee Extension Strengthening;Right;10 reps   SLS 5x    Knee/Hip Exercises: Supine   Quad Sets Strengthening;15 reps   Heel Slides Right   Heel Slides Limitations 8-120   Terminal Knee Extension Strengthening;15 reps   Knee Extension PROM;5 sets;20 reps   Knee/Hip Exercises: Prone   Prone Knee Hang Limitations terminal extension x 10    Manual Therapy   Manual Therapy Soft tissue mobilization;Myofascial release   Manual therapy comments all manual done seprerate from all other aspects of treatment.    Soft tissue mobilization for hamstring mm while prone to increase extension  PT Short Term Goals - 04/05/15 0833    PT SHORT TERM GOAL #1   Title Pt ROM to improve to 10 to 115 degress to allow improve tolerance of sitting for up to 60 minutes for traveling to the beach    Baseline 12-120 on 04/05/2015   Time 4   Period Weeks   Status On-going   PT SHORT TERM GOAL #2   Title Pt single leg stance to be at least 10 seconds to reduce risk of falling    Time 4   Period Weeks   Status On-going   PT SHORT TERM GOAL #3   Title Pt pain to be no greater than a 2/10 to be able to tolerate weightbearing activity for an hour.    Time 4   Period Weeks   Status Achieved   PT SHORT TERM GOAL #4   Title Pt to be independent with a home exercise program to improve ROM    Time 4   Period Weeks   Status Achieved            PT Long Term Goals - 04/05/15 0834    PT LONG TERM GOAL #1   Title Pt ROM to be improved  to 5 to 120 degrees to allow pt  ambulate with a normalized gait pattern   Baseline 12-120   Time 8   Period Weeks   Status Partially Met   PT LONG TERM GOAL #2   Title Pt single leg stance to be improved to 15 seconds to reduce risk of falls .   Time 8   Period Weeks   Status On-going   PT LONG TERM GOAL #3   Title Pt to be able to demonstrate the ability to go up and down 20 steps to be able to transverse steps at his beach house.    Time 8   Period Weeks   Status Achieved   PT LONG TERM GOAL #4   Title Pt to report that he is able to ambulate cofidently over grass and is  working  in his yard.      Time 8   Period Weeks   Status Achieved               Plan - 04/29/15 1029    Clinical Impression Statement Treatment continues to focus on extension.  Initially when flexion was measured flexion was only at 115.  With stretching pt was able to obtain 120.  Therapist reminded pt that although therapist was concentrating on extension in the department he needed to continue to work on flexion at home,     PT Frequency 2x / week   PT Duration 8 weeks   PT Treatment/Interventions Gait training;Stair training;Therapeutic activities;Electrical Stimulation;Cryotherapy;Therapeutic exercise;Balance training;Patient/family education;Manual techniques   PT Next Visit Plan Reassess next visit anticipate discharge with pt needing to continue to work at home on regaining more extension.    Consulted and Agree with Plan of Care Patient      Patient will benefit from skilled therapeutic intervention in order to improve the following deficits and impairments:  Decreased activity tolerance, Decreased balance, Decreased range of motion, Pain, Increased edema, Increased fascial restricitons  Visit Diagnosis: Other abnormalities of gait and mobility  Unsteadiness on feet  Muscle weakness  (generalized)  Stiffness of right knee, not elsewhere classified     Problem List Patient Active Problem List   Diagnosis Date Noted  . Primary localized osteoarthritis of right knee 02/26/2015  .   Fecal incontinence 09/24/2014  . Precordial pain 01/18/2014  . Adhesive capsulitis of shoulder 06/15/2013  . Decreased range of motion of right shoulder 05/19/2013  . Muscle weakness (generalized) 05/19/2013  . Diabetic autonomic neuropathy ([place]) 05/20/2012  . Other and unspecified hyperlipidemia 05/20/2012  . Insomnia 05/20/2012  . Essential hypertension, benign 05/20/2012    Rayetta Humphrey, PT CLT [phone] 04/29/2015, 10:33 AM  [place] [address] [place], [place], [address] Phone: [phone]   Fax:  [phone]  Name: Logan Hooper MRN: [id number] Date of Birth: 05/26/1945

## 2015-05-01 ENCOUNTER — Ambulatory Visit (HOSPITAL_COMMUNITY): Payer: Medicare Other | Admitting: Physical Therapy

## 2015-05-01 DIAGNOSIS — M25661 Stiffness of right knee, not elsewhere classified: Secondary | ICD-10-CM

## 2015-05-01 DIAGNOSIS — M25662 Stiffness of left knee, not elsewhere classified: Secondary | ICD-10-CM | POA: Diagnosis not present

## 2015-05-01 DIAGNOSIS — R262 Difficulty in walking, not elsewhere classified: Secondary | ICD-10-CM | POA: Diagnosis not present

## 2015-05-01 DIAGNOSIS — R2681 Unsteadiness on feet: Secondary | ICD-10-CM | POA: Diagnosis not present

## 2015-05-01 DIAGNOSIS — R269 Unspecified abnormalities of gait and mobility: Secondary | ICD-10-CM | POA: Diagnosis not present

## 2015-05-01 DIAGNOSIS — M6281 Muscle weakness (generalized): Secondary | ICD-10-CM | POA: Diagnosis not present

## 2015-05-01 DIAGNOSIS — R2689 Other abnormalities of gait and mobility: Secondary | ICD-10-CM

## 2015-05-01 NOTE — Therapy (Signed)
[place], [place], [address] Phone: [phone]   Fax:  [phone]  Physical Therapy Treatment  Patient Details  Name: Logan Hooper MRN: [id number] Date of Birth: 03/29/1945 Referring Provider: Marchia Bond  Encounter Date: 05/01/2015      PT End of Session - 05/01/15 0958    Visit Number 18   Number of Visits 18   Authorization Type medicare   Authorization - Visit Number 18   PT Start Time 7989   PT Stop Time 1030   PT Time Calculation (min) 40 min   Activity Tolerance Patient tolerated treatment well   Behavior During Therapy [facility] for tasks assessed/performed      Past Medical History  Diagnosis Date  . Essential hypertension   . Hyperlipidemia   . Arthritis   . ED (erectile dysfunction)   . Sciatica   . PTSD (post-traumatic stress disorder)   . Shortness of breath dyspnea     with exertion  . Nephrolithiasis     30 % use of right kidney  . Renal insufficiency   . Primary localized osteoarthritis of right knee 02/26/2015  . 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 Back surgery    . Video assisted thoracoscopy (vats)/decortication Left 2009    Dr. Arlyce Dice  -left-sided empyema  . Colonoscopy    . Rotator cuff repair      BILATERAL  . Removal of kidney  stones    . 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      Subjective Assessment - 05/01/15 0951    Subjective Patient states that he is not really having trouble doing anything at home.  He occasionally has some medical pain in his knee but feels good right now.    Pertinent History 2 previous back surgeries    How long can you  sit comfortably? 10-15 minutes was 10-15 minutes    How long can you stand comfortably? 30 minutes was 10 initially and 42mnutes  3 weeks ago.     How long can you walk comfortably? 15 minutes was 15 minutes             [facility] Assessment - 05/01/15 0001    Assessment   Medical Diagnosis Rt total knee replacement   Referring Provider JMarchia Bond  Onset Date/Surgical Date 03/08/15   Next MD Visit 04/10/2015   Prior Therapy home health   Precautions   Precautions None   Restrictions   Weight Bearing Restrictions No   Home Environment   Living Environment Private residence   Type of HMineolato enter   Entrance Stairs-Number of Steps 3   Home Layout Laundry or work area in basement   Prior Function   Level of IMaplewoodRetired   Leisure walking, going to t[employer] Industrieshas c[employer]with 21 steps, race car events    Cognition   Overall Cognitive Status Within Functional Limits for tasks assessed   Observation/Other Assessments   Focus on Therapeutic Outcomes (FOTO)  70  was 68   Functional Tests   Functional tests  Single leg stance;Sit to Stand   Single Leg Stance   Comments Rt 9; Lt 12 seconds was Both LE 3 seconds a piece    Sit to Stand   Comments 9.29 was 10.2    AROM   Right Knee Extension 12  was 23    Right Knee Flexion 120  was 105    Strength   Right Hip Flexion 5/5   Right Hip Extension 4+/5  was 4/5    Right Hip ABduction 5/5  was 4/5    Right Knee Extension 5/5   Right Ankle Dorsiflexion 5/5                     [facility] Adult PT Treatment/Exercise - 05/01/15 0001    Knee/Hip Exercises: Stretches   Active Hamstring Stretch Right;3 reps;30 seconds   Passive Hamstring Stretch Right;1 rep;60 seconds   Gastroc Stretch 2 reps;60 seconds   Gastroc Stretch Limitations slant board   Knee/Hip Exercises: Aerobic   Nustep hills 3; level 3 x 10:00   Knee/Hip Exercises: Supine   Quad Sets 10 reps   Heel  Slides Strengthening;5 reps;10 reps   Knee Extension PROM   Knee/Hip Exercises: Prone   Hip Extension Right;10 reps                  PT Short Term Goals - 05/01/15 1022    PT SHORT TERM GOAL #1   Title Pt ROM to improve to 10 to 115 degress to allow improve tolerance of sitting for up to 60 minutes for traveling to the beach    Baseline 12-120 on 4/26   Time 4   Status Partially Met   PT SHORT TERM GOAL #2   Title Pt single leg stance to be at least 10 seconds to reduce risk of falling    Baseline able to on one leg    Time 4   Period Weeks   Status Partially Met   PT SHORT TERM GOAL #3   Title Pt pain to be no greater than a 2/10 to be able to tolerate weightbearing activity for an hour.    Time 4   Period Weeks   Status Achieved   PT SHORT TERM GOAL #4   Title Pt to be independent with a home exercise program to improve ROM    Time 4   Period Weeks   Status Achieved           PT Long Term Goals - 05/01/15 1023    PT LONG TERM GOAL #1   Title Pt ROM to be improved  to 5 to 120 degrees to allow pt  ambulate with a normalized gait pattern   Baseline 12-120   Time 8   Period Weeks   Status Partially Met   PT LONG TERM GOAL #2   Title Pt single leg stance to be improved to 15 seconds to reduce risk of falls .   Baseline able to complet 17 seconds on Lt    Time 8   Period Weeks   Status Partially Met   PT LONG TERM GOAL #3   Title Pt to be able to demonstrate the ability to go up and down 20 steps to be able to transverse steps at his beach house.    Time 8   Period Weeks   Status Achieved               Plan - 05/01/15 1029  Clinical Impression Statement Pt reassessed.  Pt has shown minimal progress in ROM or balance since last assessment on 04/05/2015.  Pt is pleased with functional ability and admitts to not completing the stretches at home as directed by therapist.  Pt is aware of HEP for stretching and balancing and is no longer in need of  skilled therapy    PT Frequency 2x / week   PT Duration 8 weeks   PT Treatment/Interventions Gait training;Stair training;Therapeutic activities;Electrical Stimulation;Cryotherapy;Therapeutic exercise;Balance training;Patient/family education;Manual techniques   PT Home Exercise Plan discharge at this time.       Patient will benefit from skilled therapeutic intervention in order to improve the following deficits and impairments:  Decreased activity tolerance, Decreased balance, Decreased range of motion, Pain, Increased edema, Increased fascial restricitons  Visit Diagnosis: Other abnormalities of gait and mobility  Unsteadiness on feet  Muscle weakness (generalized)  Stiffness of right knee, not elsewhere classified       G-Codes - 05-30-15 1024    Functional Limitation Mobility: Walking and moving around   Mobility: Walking and Moving Around Goal Status [phone]) At least 1 percent but less than 20 percent impaired, limited or restricted   Mobility: Walking and Moving Around Discharge Status [id number]) At least 20 percent but less than 40 percent impaired, limited or restricted      Problem List Patient Active Problem List   Diagnosis Date Noted  . Primary localized osteoarthritis of right knee 02/26/2015  . Fecal incontinence 09/24/2014  . Precordial pain 01/18/2014  . Adhesive capsulitis of shoulder 06/15/2013  . Decreased range of motion of right shoulder 05/19/2013  . Muscle weakness (generalized) 05/19/2013  . Diabetic autonomic neuropathy ([place]) 05/20/2012  . Other and unspecified hyperlipidemia 05/20/2012  . Insomnia 05/20/2012  . Essential hypertension, benign 05/20/2012    Rayetta Humphrey, PT CLT [phone] 2015/05/30, 11:28 AM  [place], [place], [address] Phone: [phone]   Fax:  [phone]  Name: AYYAN SITES MRN: [id number] Date of Birth: 1945/12/31  PHYSICAL THERAPY DISCHARGE  SUMMARY  Visits from Start of Care: 18  Current functional level related to goals / functional outcomes: See above :  Pt doing all normal daily routine activities  Remaining deficits: Extension of Rt knee    Education / Equipment: HEP  Plan: Patient agrees to discharge.  Patient goals were partially met. Patient is being discharged due to being pleased with the current functional level.  ?????        Rayetta Humphrey, [place] CLT [phone]

## 2015-05-08 DIAGNOSIS — M1711 Unilateral primary osteoarthritis, right knee: Secondary | ICD-10-CM | POA: Diagnosis not present

## 2015-05-17 LAB — BASIC METABOLIC PANEL: Glucose: 175 mg/dL

## 2015-07-24 ENCOUNTER — Ambulatory Visit: Payer: Medicare Other | Admitting: Family Medicine

## 2015-07-25 DIAGNOSIS — M488X6 Other specified spondylopathies, lumbar region: Secondary | ICD-10-CM | POA: Diagnosis not present

## 2015-07-25 DIAGNOSIS — M47816 Spondylosis without myelopathy or radiculopathy, lumbar region: Secondary | ICD-10-CM | POA: Diagnosis not present

## 2015-07-29 LAB — HM DIABETES EYE EXAM

## 2015-07-30 ENCOUNTER — Encounter: Payer: Self-pay | Admitting: Family Medicine

## 2015-07-30 ENCOUNTER — Ambulatory Visit (INDEPENDENT_AMBULATORY_CARE_PROVIDER_SITE_OTHER): Payer: Medicare Other | Admitting: Family Medicine

## 2015-07-30 VITALS — BP 130/72 | Ht 71.0 in | Wt 257.2 lb

## 2015-07-30 DIAGNOSIS — E119 Type 2 diabetes mellitus without complications: Secondary | ICD-10-CM

## 2015-07-30 DIAGNOSIS — Z125 Encounter for screening for malignant neoplasm of prostate: Secondary | ICD-10-CM

## 2015-07-30 DIAGNOSIS — Z79899 Other long term (current) drug therapy: Secondary | ICD-10-CM

## 2015-07-30 DIAGNOSIS — I10 Essential (primary) hypertension: Secondary | ICD-10-CM | POA: Diagnosis not present

## 2015-07-30 DIAGNOSIS — E785 Hyperlipidemia, unspecified: Secondary | ICD-10-CM

## 2015-07-30 LAB — POCT GLYCOSYLATED HEMOGLOBIN (HGB A1C): HEMOGLOBIN A1C: 8.1

## 2015-07-30 NOTE — Progress Notes (Signed)
   Subjective:    Patient ID: Logan Hooper, male    DOB: [birth year], 70 y.o.   MRN: [id number]  Diabetes  He presents for his follow-up diabetic visit. He has type 2 diabetes mellitus. Risk factors for coronary artery disease include dyslipidemia and hypertension. Current diabetic treatment includes insulin injections. His weight is stable. He is following a diabetic diet. He has not had a previous visit with a dietitian. He does not see a podiatrist.Eye exam is current.    Results for orders placed or performed in visit on 07/30/15  POCT glycosylated hemoglobin (Hb A1C)  Result Value Ref Range   Hemoglobin A1C 8.1   HM DIABETES EYE EXAM  Result Value Ref Range   HM Diabetic Eye Exam No Retinopathy No Retinopathy   Numbers up a little in the morn 140 or so  lantus using 35 ea night  snall catracts in the eyes no diab damage  Now up to thirty five on the lantus  Patient continues to take lipid medication regularly. No obvious side effects from it. Generally does not miss a dose. Prior blood work results are reviewed with patient. Patient continues to work on fat intake in diet  Blood pressure medicine and blood pressure levels reviewed today with patient. Compliant with blood pressure medicine. States does not miss a dose. No obvious side effects. Blood pressure generally good when checked elsewhere. Watching salt intake.     Review of Systems No headache, no major weight loss or weight gain, no chest pain no back pain abdominal pain no change in bowel habits complete ROS otherwise negative     Objective:   Physical Exam Alert vitals stable, NAD. Blood pressure good on repeat. HEENT normal. Lungs clear. Heart regular rate and rhythm. 1+ edema the ankles. C diabetic foot exam.       Assessment & Plan:  Impression 1 type 2 diabetes discussed at length time to press on with increase of Lantus rationale discussed #2 hyperlipidemia prior blood work reviewed meds reviewed to  continue same #3 hypertension discussed maintain same #4 peripheral edema likely secondary to Norvasc plus Actos discussed to maintain same for now #5 that in the visit patient brought up concern about memory will come back in one month for MMSE and further evaluation

## 2015-07-31 LAB — HEPATIC FUNCTION PANEL
ALBUMIN: 4.4 g/dL (ref 3.6–4.8)
ALK PHOS: 75 IU/L (ref 39–117)
ALT: 18 IU/L (ref 0–44)
AST: 22 IU/L (ref 0–40)
BILIRUBIN, DIRECT: 0.12 mg/dL (ref 0.00–0.40)
Bilirubin Total: 0.4 mg/dL (ref 0.0–1.2)
TOTAL PROTEIN: 7 g/dL (ref 6.0–8.5)

## 2015-07-31 LAB — MICROALBUMIN / CREATININE URINE RATIO
CREATININE, UR: 100.6 mg/dL
MICROALB/CREAT RATIO: 269.5 mg/g creat — ABNORMAL HIGH (ref 0.0–30.0)
MICROALBUM., U, RANDOM: 271.1 ug/mL

## 2015-07-31 LAB — LIPID PANEL
CHOLESTEROL TOTAL: 133 mg/dL (ref 100–199)
Chol/HDL Ratio: 3.2 ratio units (ref 0.0–5.0)
HDL: 41 mg/dL (ref >39)
LDL Calculated: 68 mg/dL (ref 0–99)
Triglycerides: 122 mg/dL (ref 0–149)
VLDL Cholesterol Cal: 24 mg/dL (ref 5–40)

## 2015-07-31 LAB — BASIC METABOLIC PANEL
BUN / CREAT RATIO: 23 (ref 10–24)
BUN: 25 mg/dL (ref 8–27)
CO2: 24 mmol/L (ref 18–29)
CREATININE: 1.1 mg/dL (ref 0.76–1.27)
Calcium: 9.7 mg/dL (ref 8.6–10.2)
Chloride: 100 mmol/L (ref 96–106)
GFR calc Af Amer: 79 mL/min/{1.73_m2} (ref >59)
GFR calc non Af Amer: 68 mL/min/{1.73_m2} (ref >59)
Glucose: 142 mg/dL — ABNORMAL HIGH (ref 65–99)
POTASSIUM: 4.9 mmol/L (ref 3.5–5.2)
SODIUM: 141 mmol/L (ref 134–144)

## 2015-07-31 LAB — PSA: PROSTATE SPECIFIC AG, SERUM: 2.3 ng/mL (ref 0.0–4.0)

## 2015-08-05 ENCOUNTER — Encounter: Payer: Self-pay | Admitting: Family Medicine

## 2015-08-12 DIAGNOSIS — M1711 Unilateral primary osteoarthritis, right knee: Secondary | ICD-10-CM | POA: Diagnosis not present

## 2015-08-29 ENCOUNTER — Ambulatory Visit (INDEPENDENT_AMBULATORY_CARE_PROVIDER_SITE_OTHER): Payer: Medicare Other | Admitting: Family Medicine

## 2015-08-29 ENCOUNTER — Encounter: Payer: Self-pay | Admitting: Family Medicine

## 2015-08-29 VITALS — BP 130/70 | Ht 71.0 in | Wt 260.0 lb

## 2015-08-29 DIAGNOSIS — R413 Other amnesia: Secondary | ICD-10-CM | POA: Diagnosis not present

## 2015-08-29 MED ORDER — TRIAMCINOLONE ACETONIDE 0.1 % EX CREA: 1.0000 "application " | TOPICAL_CREAM | Freq: Two times a day (BID) | CUTANEOUS | 0 refills | Status: DC

## 2015-08-29 NOTE — Progress Notes (Signed)
   Subjective:    Patient ID: Logan Hooper, male    DOB: 03-05-1945, 70 y.o.   MRN: [id number]  HPI Patient is here today to discuss his memory loss issues. MMSE completed. Score 30.  Pt notes forgetfulness getting worse and worse  Forgets things people told him  fam hx of gpa and gma developed dementia  Fa had it bad  No danger as pfar as troubles     Patient has a itchy rash on his neck. Patient has tried otc lotion with no relief.    Review of Systems No headache, no major weight loss or weight gain, no chest pain no back pain abdominal pain no change in bowel habits complete ROS otherwise negative     Objective:   Physical Exam  Alert vitals stable, NAD. Blood pressure good on repeat. HEENT normal. Lungs clear. Heart regular rate and rhythm. Patient exhibits good judgment. Speaks in full sentences. Able to communicate. Asks appropriate questions.  Full MMSE performed. Patient scored 30 out of 30      Assessment & Plan:  Impression short-term memory loss with concern on part of the patient. Some family history of dementia. Long discussion held. At this time no evidence of early dementia. Feel further workup is unwarranted. Many questions answered. 25 minutes spent most in discussion of this relatively good news. We can revisit this yearly if patient continues to sense difficulties. Nature of short-term memory loss and folks over age 20 discussed at length. Also eventual potential for dementia discussed frankly

## 2015-10-10 ENCOUNTER — Encounter: Payer: Self-pay | Admitting: Family Medicine

## 2015-10-10 ENCOUNTER — Ambulatory Visit (INDEPENDENT_AMBULATORY_CARE_PROVIDER_SITE_OTHER): Payer: Medicare Other | Admitting: Family Medicine

## 2015-10-10 VITALS — BP 130/80 | Temp 98.3°F | Ht 71.0 in | Wt 262.2 lb

## 2015-10-10 DIAGNOSIS — M549 Dorsalgia, unspecified: Secondary | ICD-10-CM

## 2015-10-10 LAB — POCT URINALYSIS DIPSTICK
PROTEIN UA: 100
Spec Grav, UA: >=1.03
pH, UA: 5

## 2015-10-10 MED ORDER — TIZANIDINE HCL 4 MG PO TABS: 4.0000 mg | ORAL_TABLET | Freq: Four times a day (QID) | ORAL | 0 refills | Status: DC | PRN

## 2015-10-10 MED ORDER — ETODOLAC 400 MG PO TABS: 400.0000 mg | ORAL_TABLET | Freq: Two times a day (BID) | ORAL | 1 refills | Status: DC

## 2015-10-10 NOTE — Progress Notes (Signed)
   Subjective:    Patient ID: Logan Hooper, male    DOB: 1945/04/21, 70 y.o.   MRN: [id number]  Back Pain  This is a new problem. The current episode started in the past 7 days. The problem occurs intermittently. The problem is unchanged. The pain is present in the lumbar spine. The pain does not radiate. The pain is moderate. The pain is the same all the time. He has tried nothing for the symptoms. The treatment provided no relief.    Back pain kicked in four or five days ago  Hx of kidney stones  Hx of pain before  And nos with movemtn  Not catching  Walking   Working on [employer] getting up and down with kene cauded apin     No back pain meds past week  Different type of [ain   Slight odoer  Results for orders placed or performed in visit on 10/10/15  POCT urinalysis dipstick  Result Value Ref Range   Color, UA     Clarity, UA     Glucose, UA     Bilirubin, UA +    Ketones, UA     Spec Grav, UA >=1.030    Blood, UA     pH, UA 5.0    Protein, UA 100    Urobilinogen, UA     Nitrite, UA     Leukocytes, UA  Negative      Review of Systems  Musculoskeletal: Positive for back pain.  No fever no chills no vomiting no dysuria no hematuria     Objective:   Physical Exam  Alert vital stable no acute distress lungs clear. Heart regular in rhythm no [place] no spinal tenderness negative straight leg positive right her lumbar tenderness      Assessment & Plan:  Impression lumbar strain plan Lodine twice a day with food. Zanaflex 3 times a day local measures discussed expect gradual resolution

## 2015-10-10 NOTE — Progress Notes (Deleted)
   Subjective:    Patient ID: Logan Hooper, male    DOB: 1945/05/17, 70 y.o.   MRN: [id number]  HPI    Review of Systems     Objective:   Physical Exam        Assessment & Plan:

## 2016-02-10 DIAGNOSIS — M1711 Unilateral primary osteoarthritis, right knee: Secondary | ICD-10-CM | POA: Diagnosis not present

## 2016-03-26 DIAGNOSIS — M79675 Pain in left toe(s): Secondary | ICD-10-CM | POA: Diagnosis not present

## 2016-03-26 DIAGNOSIS — B351 Tinea unguium: Secondary | ICD-10-CM | POA: Diagnosis not present

## 2016-03-26 DIAGNOSIS — E1140 Type 2 diabetes mellitus with diabetic neuropathy, unspecified: Secondary | ICD-10-CM | POA: Diagnosis not present

## 2016-03-26 DIAGNOSIS — M79674 Pain in right toe(s): Secondary | ICD-10-CM | POA: Diagnosis not present

## 2016-05-11 DIAGNOSIS — M1711 Unilateral primary osteoarthritis, right knee: Secondary | ICD-10-CM | POA: Diagnosis not present

## 2016-06-04 DIAGNOSIS — M79674 Pain in right toe(s): Secondary | ICD-10-CM | POA: Diagnosis not present

## 2016-06-04 DIAGNOSIS — M79675 Pain in left toe(s): Secondary | ICD-10-CM | POA: Diagnosis not present

## 2016-06-04 DIAGNOSIS — B351 Tinea unguium: Secondary | ICD-10-CM | POA: Diagnosis not present

## 2016-06-04 DIAGNOSIS — E1140 Type 2 diabetes mellitus with diabetic neuropathy, unspecified: Secondary | ICD-10-CM | POA: Diagnosis not present

## 2016-07-21 DIAGNOSIS — M488X6 Other specified spondylopathies, lumbar region: Secondary | ICD-10-CM | POA: Diagnosis not present

## 2016-07-21 DIAGNOSIS — M47816 Spondylosis without myelopathy or radiculopathy, lumbar region: Secondary | ICD-10-CM | POA: Diagnosis not present

## 2016-08-10 DIAGNOSIS — M79675 Pain in left toe(s): Secondary | ICD-10-CM | POA: Diagnosis not present

## 2016-08-10 DIAGNOSIS — E1140 Type 2 diabetes mellitus with diabetic neuropathy, unspecified: Secondary | ICD-10-CM | POA: Diagnosis not present

## 2016-08-10 DIAGNOSIS — B351 Tinea unguium: Secondary | ICD-10-CM | POA: Diagnosis not present

## 2016-08-10 DIAGNOSIS — M79674 Pain in right toe(s): Secondary | ICD-10-CM | POA: Diagnosis not present

## 2016-08-14 ENCOUNTER — Other Ambulatory Visit (HOSPITAL_COMMUNITY): Payer: Self-pay | Admitting: Nurse Practitioner

## 2016-08-14 ENCOUNTER — Ambulatory Visit (HOSPITAL_COMMUNITY)
Admission: RE | Admit: 2016-08-14 | Discharge: 2016-08-14 | Disposition: A | Discharge status: Home / Self Care | Payer: Medicare Other | Source: Ambulatory Visit | Attending: Nurse Practitioner | Admitting: Nurse Practitioner

## 2016-08-14 DIAGNOSIS — Z981 Arthrodesis status: Secondary | ICD-10-CM | POA: Insufficient documentation

## 2016-08-14 DIAGNOSIS — M5136 Other intervertebral disc degeneration, lumbar region: Secondary | ICD-10-CM | POA: Diagnosis not present

## 2016-08-14 DIAGNOSIS — M4316 Spondylolisthesis, lumbar region: Secondary | ICD-10-CM | POA: Insufficient documentation

## 2016-08-14 DIAGNOSIS — M488X6 Other specified spondylopathies, lumbar region: Secondary | ICD-10-CM | POA: Diagnosis not present

## 2016-08-14 DIAGNOSIS — M25552 Pain in left hip: Secondary | ICD-10-CM | POA: Diagnosis not present

## 2016-08-14 DIAGNOSIS — S79912A Unspecified injury of left hip, initial encounter: Secondary | ICD-10-CM | POA: Diagnosis not present

## 2016-08-14 DIAGNOSIS — M47816 Spondylosis without myelopathy or radiculopathy, lumbar region: Secondary | ICD-10-CM

## 2016-08-31 ENCOUNTER — Encounter: Payer: Self-pay | Admitting: Nurse Practitioner

## 2016-08-31 ENCOUNTER — Ambulatory Visit (INDEPENDENT_AMBULATORY_CARE_PROVIDER_SITE_OTHER): Payer: Medicare Other | Admitting: Nurse Practitioner

## 2016-08-31 ENCOUNTER — Ambulatory Visit (HOSPITAL_COMMUNITY)
Admission: RE | Admit: 2016-08-31 | Discharge: 2016-08-31 | Disposition: A | Discharge status: Home / Self Care | Payer: Medicare Other | Source: Ambulatory Visit | Attending: Nurse Practitioner | Admitting: Nurse Practitioner

## 2016-08-31 VITALS — BP 162/82 | Temp 98.2°F | Ht 71.0 in | Wt 265.0 lb

## 2016-08-31 DIAGNOSIS — R079 Chest pain, unspecified: Secondary | ICD-10-CM | POA: Diagnosis not present

## 2016-08-31 DIAGNOSIS — R918 Other nonspecific abnormal finding of lung field: Secondary | ICD-10-CM | POA: Diagnosis not present

## 2016-09-01 ENCOUNTER — Encounter: Payer: Self-pay | Admitting: Nurse Practitioner

## 2016-09-01 ENCOUNTER — Encounter: Payer: Self-pay | Admitting: Family Medicine

## 2016-09-01 NOTE — Progress Notes (Signed)
Subjective:  Presents for complaints of localized pain in the left chest area just beneath the breast for the past 3-4 days. Has had intermittent problems with this over several years. Has a complex history including empyema in 2009. Has scar tissue from surgery in this area. Had a normal stress test in 2016 with Dr. Domenic Polite. No fever. Rare nonproductive cough. No nausea vomiting. No unusual shortness of breath. Pain in the area with deep breath or coughing or certain movements. No rash. Takes a daily low-dose aspirin as preventive care. Had a fall a few days ago, landed on his left side. Has seen his specialist about hip and leg pain. This pain flared up after his fall.  Objective:   BP (!) 162/82   Temp 98.2 F (36.8 C) (Oral)   Ht 5\' 11"  (1.803 m)   Wt 265 lb (120.2 kg)   BMI 36.96 kg/m  NAD. Alert, oriented. Lungs clear. Heart regular rate rhythm. Distinct localized tenderness noted along the left anterior lower chest wall just beneath the left breast area. Skin is clear. Significant linear scarring noted in the area from his previous surgery. Abdomen obese soft nondistended with no specific left upper quadrant tenderness noted. No obvious organomegaly but difficulty assessing due to abdominal adiposity.  Assessment:  Chest pain, unspecified type - Plan: DG Chest 2 View    Plan:  Most likely an exacerbation of his chest wall pain. Obtain chest x-ray. Further follow-up based on results. Warning signs reviewed including increased shortness of breath or fever. Return if symptoms worsen or fail to improve.

## 2016-09-06 DIAGNOSIS — I10 Essential (primary) hypertension: Secondary | ICD-10-CM | POA: Diagnosis not present

## 2016-09-06 DIAGNOSIS — Z8249 Family history of ischemic heart disease and other diseases of the circulatory system: Secondary | ICD-10-CM | POA: Diagnosis not present

## 2016-09-06 DIAGNOSIS — I129 Hypertensive chronic kidney disease with stage 1 through stage 4 chronic kidney disease, or unspecified chronic kidney disease: Secondary | ICD-10-CM | POA: Diagnosis not present

## 2016-09-06 DIAGNOSIS — E1122 Type 2 diabetes mellitus with diabetic chronic kidney disease: Secondary | ICD-10-CM | POA: Diagnosis not present

## 2016-09-06 DIAGNOSIS — R079 Chest pain, unspecified: Secondary | ICD-10-CM | POA: Diagnosis not present

## 2016-09-06 DIAGNOSIS — N183 Chronic kidney disease, stage 3 (moderate): Secondary | ICD-10-CM | POA: Diagnosis not present

## 2016-09-06 DIAGNOSIS — E785 Hyperlipidemia, unspecified: Secondary | ICD-10-CM | POA: Diagnosis not present

## 2016-09-06 DIAGNOSIS — Z794 Long term (current) use of insulin: Secondary | ICD-10-CM | POA: Diagnosis not present

## 2016-09-06 DIAGNOSIS — R0781 Pleurodynia: Secondary | ICD-10-CM | POA: Diagnosis not present

## 2016-09-06 DIAGNOSIS — R0789 Other chest pain: Secondary | ICD-10-CM | POA: Diagnosis not present

## 2016-09-06 DIAGNOSIS — E119 Type 2 diabetes mellitus without complications: Secondary | ICD-10-CM | POA: Diagnosis not present

## 2016-09-06 DIAGNOSIS — Z6836 Body mass index (BMI) 36.0-36.9, adult: Secondary | ICD-10-CM | POA: Diagnosis not present

## 2016-09-06 DIAGNOSIS — E669 Obesity, unspecified: Secondary | ICD-10-CM | POA: Diagnosis not present

## 2016-09-06 DIAGNOSIS — I160 Hypertensive urgency: Secondary | ICD-10-CM | POA: Diagnosis not present

## 2016-09-06 DIAGNOSIS — R072 Precordial pain: Secondary | ICD-10-CM | POA: Diagnosis not present

## 2016-09-06 DIAGNOSIS — Z87442 Personal history of urinary calculi: Secondary | ICD-10-CM | POA: Diagnosis not present

## 2016-09-06 DIAGNOSIS — Z87891 Personal history of nicotine dependence: Secondary | ICD-10-CM | POA: Diagnosis not present

## 2016-09-06 DIAGNOSIS — Z79899 Other long term (current) drug therapy: Secondary | ICD-10-CM | POA: Diagnosis not present

## 2016-09-07 DIAGNOSIS — N183 Chronic kidney disease, stage 3 (moderate): Secondary | ICD-10-CM | POA: Diagnosis not present

## 2016-09-07 DIAGNOSIS — I10 Essential (primary) hypertension: Secondary | ICD-10-CM | POA: Diagnosis not present

## 2016-09-07 DIAGNOSIS — E119 Type 2 diabetes mellitus without complications: Secondary | ICD-10-CM | POA: Diagnosis not present

## 2016-09-07 DIAGNOSIS — R079 Chest pain, unspecified: Secondary | ICD-10-CM | POA: Diagnosis not present

## 2016-09-07 DIAGNOSIS — R0781 Pleurodynia: Secondary | ICD-10-CM | POA: Diagnosis not present

## 2016-10-19 DIAGNOSIS — E1140 Type 2 diabetes mellitus with diabetic neuropathy, unspecified: Secondary | ICD-10-CM | POA: Diagnosis not present

## 2016-10-19 DIAGNOSIS — B351 Tinea unguium: Secondary | ICD-10-CM | POA: Diagnosis not present

## 2016-10-19 DIAGNOSIS — M79674 Pain in right toe(s): Secondary | ICD-10-CM | POA: Diagnosis not present

## 2016-10-19 DIAGNOSIS — M79675 Pain in left toe(s): Secondary | ICD-10-CM | POA: Diagnosis not present

## 2017-01-11 DIAGNOSIS — M79674 Pain in right toe(s): Secondary | ICD-10-CM | POA: Diagnosis not present

## 2017-01-11 DIAGNOSIS — E1140 Type 2 diabetes mellitus with diabetic neuropathy, unspecified: Secondary | ICD-10-CM | POA: Diagnosis not present

## 2017-01-11 DIAGNOSIS — B351 Tinea unguium: Secondary | ICD-10-CM | POA: Diagnosis not present

## 2017-01-11 DIAGNOSIS — M79675 Pain in left toe(s): Secondary | ICD-10-CM | POA: Diagnosis not present

## 2017-03-22 DIAGNOSIS — M79675 Pain in left toe(s): Secondary | ICD-10-CM | POA: Diagnosis not present

## 2017-03-22 DIAGNOSIS — B351 Tinea unguium: Secondary | ICD-10-CM | POA: Diagnosis not present

## 2017-03-22 DIAGNOSIS — M79674 Pain in right toe(s): Secondary | ICD-10-CM | POA: Diagnosis not present

## 2017-03-22 DIAGNOSIS — E1140 Type 2 diabetes mellitus with diabetic neuropathy, unspecified: Secondary | ICD-10-CM | POA: Diagnosis not present

## 2017-06-14 DIAGNOSIS — M79674 Pain in right toe(s): Secondary | ICD-10-CM | POA: Diagnosis not present

## 2017-06-14 DIAGNOSIS — E1140 Type 2 diabetes mellitus with diabetic neuropathy, unspecified: Secondary | ICD-10-CM | POA: Diagnosis not present

## 2017-06-14 DIAGNOSIS — M79675 Pain in left toe(s): Secondary | ICD-10-CM | POA: Diagnosis not present

## 2017-06-14 DIAGNOSIS — B351 Tinea unguium: Secondary | ICD-10-CM | POA: Diagnosis not present

## 2017-07-28 ENCOUNTER — Ambulatory Visit (INDEPENDENT_AMBULATORY_CARE_PROVIDER_SITE_OTHER): Payer: Medicare Other | Admitting: Family Medicine

## 2017-07-28 ENCOUNTER — Encounter: Payer: Self-pay | Admitting: Family Medicine

## 2017-07-28 VITALS — BP 132/82 | Ht 71.0 in | Wt 249.4 lb

## 2017-07-28 DIAGNOSIS — M545 Low back pain: Secondary | ICD-10-CM

## 2017-07-28 LAB — POCT URINALYSIS DIPSTICK
PH UA: 6 (ref 5.0–8.0)
SPEC GRAV UA: 1.015 (ref 1.010–1.025)

## 2017-07-28 MED ORDER — HYDROCODONE-ACETAMINOPHEN 5-325 MG PO TABS: ORAL_TABLET | ORAL | 0 refills | Status: DC

## 2017-07-28 NOTE — Progress Notes (Signed)
Subjective:    Patient ID: Logan Hooper, male    DOB: [birth year], 72 y.o.   MRN: [id number]  HPI  Patient arrives with lower back pain that goes into his hp and shoots down his right leg  Hx of pain in back of hip and worse whn he goes to stand    incr frequencey the last few days  Pt on hydrocodone for pain ,s ays its left over fom previous                                                                                                                  ++                                                                                                                                                                                                                           ++++++++++++++++++++++++++++++++  ++ + ++  ++ + ++         +            +                  . Results for orders placed or performed in visit on 10/10/15  POCT urinalysis dipstick  Result Value Ref Range   Color, UA     Clarity, UA     Glucose, UA     Bilirubin, UA +    Ketones, UA     Spec Grav, UA >=1.030    Blood, UA     pH, UA 5.0    Protein, UA 100    Urobilinogen, UA     Nitrite, UA     Leukocytes, UA  Negative   Goes to [place] for reg and chronic care   Review of Systems No headache, no major weight loss or weight gain, no chest pain no back pain abdominal pain no change in bowel habits complete ROS otherwise negative     Objective:   Physical  Exam Alert vitals stable, NAD. Blood pressure good on repeat. HEENT normal. Lungs clear. Heart regular rate and rhythm. Negative true CVA tenderness positive right paralumbar tenderness to deep  palpation negative true straight leg raise  Urinalysis normal       Assessment & Plan:  Impression lumbar strain.  Patient already on medicine and hydrocodone..  Local measures discussed.  Of note patient goes elsewhere to the [facility] for all chronic and preventive care

## 2017-08-02 DIAGNOSIS — M47816 Spondylosis without myelopathy or radiculopathy, lumbar region: Secondary | ICD-10-CM | POA: Diagnosis not present

## 2017-08-03 ENCOUNTER — Other Ambulatory Visit (HOSPITAL_COMMUNITY): Payer: Self-pay | Admitting: Nurse Practitioner

## 2017-08-03 DIAGNOSIS — M47816 Spondylosis without myelopathy or radiculopathy, lumbar region: Secondary | ICD-10-CM

## 2017-08-04 ENCOUNTER — Other Ambulatory Visit (HOSPITAL_COMMUNITY): Payer: Self-pay | Admitting: Nurse Practitioner

## 2017-08-04 DIAGNOSIS — M47816 Spondylosis without myelopathy or radiculopathy, lumbar region: Secondary | ICD-10-CM

## 2017-08-11 ENCOUNTER — Ambulatory Visit (HOSPITAL_COMMUNITY): Payer: Medicare Other

## 2017-08-12 ENCOUNTER — Ambulatory Visit (HOSPITAL_COMMUNITY)
Admission: RE | Admit: 2017-08-12 | Discharge: 2017-08-12 | Disposition: A | Discharge status: Home / Self Care | Payer: Medicare Other | Source: Ambulatory Visit | Attending: Nurse Practitioner | Admitting: Nurse Practitioner

## 2017-08-12 ENCOUNTER — Ambulatory Visit (HOSPITAL_COMMUNITY): Payer: Medicare Other

## 2017-08-12 DIAGNOSIS — M79604 Pain in right leg: Secondary | ICD-10-CM | POA: Insufficient documentation

## 2017-08-12 DIAGNOSIS — M48061 Spinal stenosis, lumbar region without neurogenic claudication: Secondary | ICD-10-CM | POA: Diagnosis not present

## 2017-08-12 DIAGNOSIS — M47816 Spondylosis without myelopathy or radiculopathy, lumbar region: Secondary | ICD-10-CM | POA: Diagnosis not present

## 2017-08-12 DIAGNOSIS — M545 Low back pain: Secondary | ICD-10-CM | POA: Diagnosis not present

## 2017-08-17 DIAGNOSIS — M4316 Spondylolisthesis, lumbar region: Secondary | ICD-10-CM | POA: Diagnosis not present

## 2017-08-17 DIAGNOSIS — M48062 Spinal stenosis, lumbar region with neurogenic claudication: Secondary | ICD-10-CM | POA: Diagnosis not present

## 2017-08-23 DIAGNOSIS — F332 Major depressive disorder, recurrent severe without psychotic features: Secondary | ICD-10-CM | POA: Diagnosis not present

## 2017-08-23 DIAGNOSIS — F4312 Post-traumatic stress disorder, chronic: Secondary | ICD-10-CM | POA: Diagnosis not present

## 2017-08-30 DIAGNOSIS — M79674 Pain in right toe(s): Secondary | ICD-10-CM | POA: Diagnosis not present

## 2017-08-30 DIAGNOSIS — E1140 Type 2 diabetes mellitus with diabetic neuropathy, unspecified: Secondary | ICD-10-CM | POA: Diagnosis not present

## 2017-08-30 DIAGNOSIS — B351 Tinea unguium: Secondary | ICD-10-CM | POA: Diagnosis not present

## 2017-08-30 DIAGNOSIS — M79675 Pain in left toe(s): Secondary | ICD-10-CM | POA: Diagnosis not present

## 2017-09-30 DIAGNOSIS — Z01818 Encounter for other preprocedural examination: Secondary | ICD-10-CM | POA: Diagnosis not present

## 2017-09-30 DIAGNOSIS — M48062 Spinal stenosis, lumbar region with neurogenic claudication: Secondary | ICD-10-CM | POA: Diagnosis not present

## 2017-10-04 DIAGNOSIS — Z79899 Other long term (current) drug therapy: Secondary | ICD-10-CM | POA: Diagnosis not present

## 2017-10-04 DIAGNOSIS — M47816 Spondylosis without myelopathy or radiculopathy, lumbar region: Secondary | ICD-10-CM | POA: Diagnosis not present

## 2017-10-04 DIAGNOSIS — M545 Low back pain: Secondary | ICD-10-CM | POA: Diagnosis not present

## 2017-10-04 DIAGNOSIS — M4726 Other spondylosis with radiculopathy, lumbar region: Secondary | ICD-10-CM | POA: Diagnosis not present

## 2017-10-04 DIAGNOSIS — M48062 Spinal stenosis, lumbar region with neurogenic claudication: Secondary | ICD-10-CM | POA: Diagnosis not present

## 2017-10-04 DIAGNOSIS — M1990 Unspecified osteoarthritis, unspecified site: Secondary | ICD-10-CM | POA: Diagnosis present

## 2017-10-04 DIAGNOSIS — M4316 Spondylolisthesis, lumbar region: Secondary | ICD-10-CM | POA: Diagnosis not present

## 2017-10-04 DIAGNOSIS — Z2821 Immunization not carried out because of patient refusal: Secondary | ICD-10-CM | POA: Diagnosis not present

## 2017-10-04 DIAGNOSIS — Z87891 Personal history of nicotine dependence: Secondary | ICD-10-CM | POA: Diagnosis not present

## 2017-10-04 DIAGNOSIS — J449 Chronic obstructive pulmonary disease, unspecified: Secondary | ICD-10-CM | POA: Diagnosis present

## 2017-10-04 DIAGNOSIS — M47896 Other spondylosis, lumbar region: Secondary | ICD-10-CM | POA: Diagnosis present

## 2017-10-04 DIAGNOSIS — Z7982 Long term (current) use of aspirin: Secondary | ICD-10-CM | POA: Diagnosis not present

## 2017-10-04 DIAGNOSIS — G629 Polyneuropathy, unspecified: Secondary | ICD-10-CM | POA: Diagnosis present

## 2017-10-04 DIAGNOSIS — E785 Hyperlipidemia, unspecified: Secondary | ICD-10-CM | POA: Diagnosis not present

## 2017-10-04 DIAGNOSIS — E119 Type 2 diabetes mellitus without complications: Secondary | ICD-10-CM | POA: Diagnosis not present

## 2017-10-04 DIAGNOSIS — M48061 Spinal stenosis, lumbar region without neurogenic claudication: Secondary | ICD-10-CM | POA: Diagnosis not present

## 2017-10-04 DIAGNOSIS — Z4889 Encounter for other specified surgical aftercare: Secondary | ICD-10-CM | POA: Diagnosis not present

## 2017-10-04 DIAGNOSIS — Z981 Arthrodesis status: Secondary | ICD-10-CM | POA: Diagnosis not present

## 2017-10-04 DIAGNOSIS — Z794 Long term (current) use of insulin: Secondary | ICD-10-CM | POA: Diagnosis not present

## 2017-10-04 DIAGNOSIS — I10 Essential (primary) hypertension: Secondary | ICD-10-CM | POA: Diagnosis not present

## 2017-10-12 DIAGNOSIS — Z981 Arthrodesis status: Secondary | ICD-10-CM | POA: Diagnosis not present

## 2017-10-12 DIAGNOSIS — M48062 Spinal stenosis, lumbar region with neurogenic claudication: Secondary | ICD-10-CM | POA: Diagnosis not present

## 2017-11-08 DIAGNOSIS — M79675 Pain in left toe(s): Secondary | ICD-10-CM | POA: Diagnosis not present

## 2017-11-08 DIAGNOSIS — M79674 Pain in right toe(s): Secondary | ICD-10-CM | POA: Diagnosis not present

## 2017-11-08 DIAGNOSIS — B351 Tinea unguium: Secondary | ICD-10-CM | POA: Diagnosis not present

## 2017-11-08 DIAGNOSIS — E1140 Type 2 diabetes mellitus with diabetic neuropathy, unspecified: Secondary | ICD-10-CM | POA: Diagnosis not present

## 2017-11-10 DIAGNOSIS — Z981 Arthrodesis status: Secondary | ICD-10-CM | POA: Diagnosis not present

## 2017-11-10 DIAGNOSIS — M47816 Spondylosis without myelopathy or radiculopathy, lumbar region: Secondary | ICD-10-CM | POA: Diagnosis not present

## 2017-11-10 DIAGNOSIS — N201 Calculus of ureter: Secondary | ICD-10-CM | POA: Diagnosis not present

## 2017-11-10 DIAGNOSIS — K8020 Calculus of gallbladder without cholecystitis without obstruction: Secondary | ICD-10-CM | POA: Diagnosis not present

## 2017-11-24 ENCOUNTER — Other Ambulatory Visit: Payer: Self-pay

## 2018-01-18 ENCOUNTER — Ambulatory Visit: Payer: Medicare Other | Admitting: Family Medicine

## 2018-01-18 ENCOUNTER — Ambulatory Visit (INDEPENDENT_AMBULATORY_CARE_PROVIDER_SITE_OTHER): Payer: Medicare Other | Admitting: Family Medicine

## 2018-01-18 ENCOUNTER — Encounter: Payer: Self-pay | Admitting: Family Medicine

## 2018-01-18 VITALS — BP 148/70 | Temp 98.3°F | Ht 71.0 in | Wt 241.8 lb

## 2018-01-18 DIAGNOSIS — J329 Chronic sinusitis, unspecified: Secondary | ICD-10-CM | POA: Diagnosis not present

## 2018-01-18 DIAGNOSIS — J4520 Mild intermittent asthma, uncomplicated: Secondary | ICD-10-CM | POA: Diagnosis not present

## 2018-01-18 MED ORDER — ALBUTEROL SULFATE HFA 108 (90 BASE) MCG/ACT IN AERS: 2.0000 | INHALATION_SPRAY | Freq: Four times a day (QID) | RESPIRATORY_TRACT | 2 refills | Status: DC | PRN

## 2018-01-18 MED ORDER — DOXYCYCLINE HYCLATE 100 MG PO TABS: 100.0000 mg | ORAL_TABLET | Freq: Two times a day (BID) | ORAL | 0 refills | Status: DC

## 2018-01-18 NOTE — Progress Notes (Signed)
   Subjective:    Patient ID: Logan Hooper, male    DOB: 09-08-1945, 73 y.o.   MRN: [id number]  Cough  This is a new problem. Episode onset: 3 weeks. Associated symptoms comments: Runny nose.   Three weeks ago kicked in ,,  Cough lots of productive stuff and gunk  No fever   Frontal headache and crusty and  Uncomfortable   Di energy    Follows elsewhere for all chronic concerns    Review of Systems  Respiratory: Positive for cough.        Objective:   Physical Exam  Alert, mild malaise. Hydration good Vitals stable. frontal/ maxillary tenderness evident positive nasal congestion. pharynx normal neck supple  lungs clear/no crackles with mild faint wheezes. heart regular in rhythm       Assessment & Plan:  Impression rhinosinusitis/sinusitis plus reactive airways.  Albuterol prescribed proper use discussed.  Likely post viral, discussed with patient. plan antibiotics prescribed. Questions answered. Symptomatic care discussed. warning signs discussed. WSL

## 2018-01-31 ENCOUNTER — Ambulatory Visit (INDEPENDENT_AMBULATORY_CARE_PROVIDER_SITE_OTHER): Payer: Medicare Other | Admitting: Family Medicine

## 2018-01-31 ENCOUNTER — Ambulatory Visit: Payer: Medicare Other | Admitting: Family Medicine

## 2018-01-31 ENCOUNTER — Encounter: Payer: Self-pay | Admitting: Family Medicine

## 2018-01-31 VITALS — BP 142/80 | Temp 98.4°F | Ht 71.0 in | Wt 241.8 lb

## 2018-01-31 DIAGNOSIS — R81 Glycosuria: Secondary | ICD-10-CM

## 2018-01-31 DIAGNOSIS — R109 Unspecified abdominal pain: Secondary | ICD-10-CM | POA: Diagnosis not present

## 2018-01-31 DIAGNOSIS — R350 Frequency of micturition: Secondary | ICD-10-CM | POA: Diagnosis not present

## 2018-01-31 DIAGNOSIS — M79675 Pain in left toe(s): Secondary | ICD-10-CM | POA: Diagnosis not present

## 2018-01-31 DIAGNOSIS — M79674 Pain in right toe(s): Secondary | ICD-10-CM | POA: Diagnosis not present

## 2018-01-31 DIAGNOSIS — B351 Tinea unguium: Secondary | ICD-10-CM | POA: Diagnosis not present

## 2018-01-31 DIAGNOSIS — E1140 Type 2 diabetes mellitus with diabetic neuropathy, unspecified: Secondary | ICD-10-CM | POA: Diagnosis not present

## 2018-01-31 LAB — POCT URINALYSIS DIPSTICK
Glucose, UA: POSITIVE — AB
PH UA: 5 (ref 5.0–8.0)
Protein, UA: POSITIVE — AB
Spec Grav, UA: >=1.03 — AB (ref 1.010–1.025)

## 2018-01-31 LAB — POCT GLUCOSE (DEVICE FOR HOME USE): POC GLUCOSE: 204 mg/dL — AB (ref 70–99)

## 2018-01-31 MED ORDER — DOXYCYCLINE HYCLATE 100 MG PO TABS: 100.0000 mg | ORAL_TABLET | Freq: Two times a day (BID) | ORAL | 0 refills | Status: DC

## 2018-01-31 NOTE — Addendum Note (Signed)
Addended by: Carmelina Noun on: 01/31/2018 11:41 AM   Modules accepted: Orders

## 2018-01-31 NOTE — Progress Notes (Signed)
   Subjective:    Patient ID: Logan Hooper, male    DOB: 09/08/1945, 73 y.o.   MRN: [id number]  Urinary Frequency   This is a new problem. Episode onset: 2 weeks. Associated symptoms include frequency. Associated symptoms comments: Pain in right kidney.    This patient relates some intermittent pain in the right kidney area Denies fever chills Denies hematuria States he has a history of kidney stones Also gets his diabetic care through the [place] he states he only goes there once a year he has noticed that his sugars are up and down  Review of Systems  Genitourinary: Positive for frequency.  Denies dysuria hematuria does relate right flank pain denies abdominal pain Denies wheezing difficulty breathing chest congestion coughing fever chills headaches    Objective:   Physical Exam Lungs are clear respiratory rate is normal no crackles heart is regular no murmurs rate is controlled extremities no edema skin warm dry flank mild discomfort to percussion  Urinalysis under the microscope shows an occasional WBC but otherwise looks normal Urine did have glucose in his sugar was 203    Patient states he does not need any pain medicine Assessment & Plan:  Patient was strongly encouraged to go back to the [facility] to get his diabetes under good control We offered him assistance here but he states he would prefer to get it through the [facility] [place]  He states he will let us know if he would like to see the endocrinologist  Mild right flank pain we will start off doing urine culture doxycycline twice daily if urine culture is negative I recommend KUB x-ray if still ongoing trouble may need possibility CT scan possibility urology consultation

## 2018-02-02 LAB — URINE CULTURE: ORGANISM ID, BACTERIA: NO GROWTH

## 2018-02-15 DIAGNOSIS — M47816 Spondylosis without myelopathy or radiculopathy, lumbar region: Secondary | ICD-10-CM | POA: Diagnosis not present

## 2018-02-18 DIAGNOSIS — Z6833 Body mass index (BMI) 33.0-33.9, adult: Secondary | ICD-10-CM | POA: Diagnosis not present

## 2018-02-18 DIAGNOSIS — I10 Essential (primary) hypertension: Secondary | ICD-10-CM | POA: Diagnosis not present

## 2018-02-18 DIAGNOSIS — S32009K Unspecified fracture of unspecified lumbar vertebra, subsequent encounter for fracture with nonunion: Secondary | ICD-10-CM | POA: Diagnosis not present

## 2018-06-13 DIAGNOSIS — M79674 Pain in right toe(s): Secondary | ICD-10-CM | POA: Diagnosis not present

## 2018-06-13 DIAGNOSIS — B351 Tinea unguium: Secondary | ICD-10-CM | POA: Diagnosis not present

## 2018-06-13 DIAGNOSIS — M79675 Pain in left toe(s): Secondary | ICD-10-CM | POA: Diagnosis not present

## 2018-06-13 DIAGNOSIS — E1140 Type 2 diabetes mellitus with diabetic neuropathy, unspecified: Secondary | ICD-10-CM | POA: Diagnosis not present

## 2018-08-22 DIAGNOSIS — E1140 Type 2 diabetes mellitus with diabetic neuropathy, unspecified: Secondary | ICD-10-CM | POA: Diagnosis not present

## 2018-08-22 DIAGNOSIS — M79674 Pain in right toe(s): Secondary | ICD-10-CM | POA: Diagnosis not present

## 2018-08-22 DIAGNOSIS — B351 Tinea unguium: Secondary | ICD-10-CM | POA: Diagnosis not present

## 2018-08-22 DIAGNOSIS — M79675 Pain in left toe(s): Secondary | ICD-10-CM | POA: Diagnosis not present

## 2018-09-02 ENCOUNTER — Other Ambulatory Visit: Payer: Self-pay

## 2018-09-02 ENCOUNTER — Encounter (HOSPITAL_COMMUNITY): Payer: Self-pay | Admitting: *Deleted

## 2018-09-02 ENCOUNTER — Emergency Department (HOSPITAL_COMMUNITY): Payer: No Typology Code available for payment source

## 2018-09-02 ENCOUNTER — Emergency Department (HOSPITAL_COMMUNITY)
Admission: EM | Admit: 2018-09-02 | Discharge: 2018-09-02 | Disposition: A | Discharge status: Home / Self Care | Payer: No Typology Code available for payment source | Attending: Emergency Medicine | Admitting: Emergency Medicine

## 2018-09-02 DIAGNOSIS — N130 Hydronephrosis with ureteropelvic junction obstruction: Secondary | ICD-10-CM | POA: Insufficient documentation

## 2018-09-02 DIAGNOSIS — E119 Type 2 diabetes mellitus without complications: Secondary | ICD-10-CM | POA: Insufficient documentation

## 2018-09-02 DIAGNOSIS — I10 Essential (primary) hypertension: Secondary | ICD-10-CM | POA: Diagnosis not present

## 2018-09-02 DIAGNOSIS — R1909 Other intra-abdominal and pelvic swelling, mass and lump: Secondary | ICD-10-CM | POA: Diagnosis not present

## 2018-09-02 DIAGNOSIS — K668 Other specified disorders of peritoneum: Secondary | ICD-10-CM

## 2018-09-02 DIAGNOSIS — Z96651 Presence of right artificial knee joint: Secondary | ICD-10-CM | POA: Insufficient documentation

## 2018-09-02 DIAGNOSIS — N200 Calculus of kidney: Secondary | ICD-10-CM | POA: Diagnosis not present

## 2018-09-02 DIAGNOSIS — Z87891 Personal history of nicotine dependence: Secondary | ICD-10-CM | POA: Insufficient documentation

## 2018-09-02 DIAGNOSIS — R1031 Right lower quadrant pain: Secondary | ICD-10-CM | POA: Diagnosis present

## 2018-09-02 LAB — URINALYSIS, ROUTINE W REFLEX MICROSCOPIC
Bacteria, UA: NONE SEEN
Bilirubin Urine: NEGATIVE
Glucose, UA: NEGATIVE mg/dL
Hgb urine dipstick: NEGATIVE
Ketones, ur: NEGATIVE mg/dL
Leukocytes,Ua: NEGATIVE
Nitrite: NEGATIVE
Protein, ur: 100 mg/dL — AB
Specific Gravity, Urine: 1.024 (ref 1.005–1.030)
pH: 5 (ref 5.0–8.0)

## 2018-09-02 MED ORDER — TAMSULOSIN HCL 0.4 MG PO CAPS: 0.4000 mg | ORAL_CAPSULE | Freq: Every day | ORAL | 0 refills | Status: AC

## 2018-09-02 NOTE — ED Provider Notes (Signed)
Emergency Department Provider Note   I have reviewed the triage vital signs and the nursing notes.   HISTORY  Chief Complaint Flank Pain (right)   HPI Logan Hooper is a 73 y.o. male with PMH of HLD, complicated nephrolithiasis in the past, CKD, and DM presents to the emergency department for evaluation of intermittent right flank pain for the past 6 months.  Over the past week he is developed urinary frequency without dysuria.  No fevers or chills.  The right flank pain has been persistent but not significantly worsening.  He has whitish skin discoloration to his foreskin which is unable to be retracted.  He has had this ongoing for the past 6 months as well.  He apparently called the [facility] yesterday with the symptoms and was called back this morning and referred to the nearest emergency department.  He notes a complicated history of kidney stones in the past requiring multiple interventions and complicated by infection.  He no longer follows with a urologist as his symptoms have mostly resolved.   Past Medical History:  Diagnosis Date  . Arthritis   . ED (erectile dysfunction)   . Essential hypertension   . Hyperlipidemia   . Nephrolithiasis    30 % use of right kidney  . Primary localized osteoarthritis of right knee 02/26/2015  . PTSD (post-traumatic stress disorder)   . Renal insufficiency   . Sciatica   . Shortness of breath dyspnea    with exertion  . 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 Primary localized osteoarthritis of right knee 02/26/2015  . Fecal incontinence 09/24/2014  . Precordial pain 01/18/2014  . Adhesive capsulitis of shoulder 06/15/2013  . Decreased range of motion of right shoulder 05/19/2013  . Muscle weakness (generalized) 05/19/2013  . Diabetic autonomic neuropathy ([place]) 05/20/2012  . Other and unspecified hyperlipidemia 05/20/2012  . Insomnia  05/20/2012  . Essential hypertension, benign 05/20/2012    Past Surgical History:  Procedure Laterality Date  . BACK SURGERY    . COLONOSCOPY    . Removal of Kidney  stones    . ROTATOR CUFF REPAIR     BILATERAL  . TOTAL KNEE ARTHROPLASTY Right 02/26/2015  . TOTAL KNEE ARTHROPLASTY Right 02/26/2015   Procedure: TOTAL KNEE ARTHROPLASTY;  Surgeon: Marchia Bond, MD;  Location: [facility];  Service: Orthopedics;  Laterality: Right;  Marland Kitchen VIDEO ASSISTED THORACOSCOPY (VATS)/DECORTICATION Left 2009   Dr. Arlyce Dice  -left-sided empyema    Allergies Patient has no known allergies.  Family History  Problem Relation Age of Onset  . Heart disease Mother        Reportedly diagnosed in her 40s  . Diabetes Mother   . Dementia Father     Social History Social History   Tobacco Use  . Smoking status: Former Smoker    Types: Cigarettes, Cigars    Start date: 01/05/1962    Quit date: 01/06/2007    Years since quitting: 11.6  . Smokeless tobacco: Never Used  Substance Use Topics  . Alcohol use: No    Alcohol/week: 0.0 standard drinks    Comment: BEER-OCC.  . Drug use: No    Review of Systems  Constitutional: No fever/chills Eyes: No visual changes. ENT: No sore throat. Cardiovascular: Denies chest pain. Respiratory: Denies shortness of breath. Gastrointestinal: No abdominal pain.  No nausea, no vomiting.  No diarrhea.  No constipation.  Positive right flank pain.  Genitourinary: Negative for dysuria. Musculoskeletal: Negative for back pain. Skin: Negative for rash. Neurological: Negative for headaches, focal weakness or numbness.  10-point ROS otherwise negative.  ____________________________________________   PHYSICAL EXAM:  VITAL SIGNS: ED Triage Vitals  Enc Vitals Group     BP 09/02/18 0925 (!) 191/89     Pulse Rate 09/02/18 0925 84     Resp 09/02/18 0925 17     Temp 09/02/18 0925 98.7 F (37.1 C)     Temp Source 09/02/18 0925 Oral     SpO2 --      Weight 09/02/18 0926 260  lb (117.9 kg)     Height 09/02/18 0926 5\' 11"  (1.803 m)   Constitutional: Alert and oriented. Well appearing and in no acute distress. Eyes: Conjunctivae are normal.   Head: Atraumatic. Nose: No congestion/rhinnorhea. Mouth/Throat: Mucous membranes are moist. Neck: No stridor.   Cardiovascular: Normal rate, regular rhythm. Good peripheral circulation. Grossly normal heart sounds.   Respiratory: Normal respiratory effort.  No retractions. Lungs CTAB. Gastrointestinal: Soft and nontender. No distention. Mild tenderness to percussion of the right flank.  GU: Patient with white discoloration to the foreskin without edema or erythema. No drainage. No pain to palpation. I am able to visualize the glans which is normal in color and without urethral irritation.  Musculoskeletal: No lower extremity tenderness nor edema. No gross deformities of extremities. No midline thoracic or lumbar spine tenderness.  Neurologic:  Normal speech and language. No gross focal neurologic deficits are appreciated.  Skin:  Skin is warm, dry and intact. No rash noted.   ____________________________________________   LABS (all labs ordered are listed, but only abnormal results are displayed)  Labs Reviewed  URINALYSIS, ROUTINE W REFLEX MICROSCOPIC - Abnormal; Notable for the following components:      Result Value   APPearance HAZY (*)    Protein, ur 100 (*)    All other components within normal limits   ____________________________________________  RADIOLOGY  Ct Renal Stone Study  Result Date: 09/02/2018 CLINICAL DATA:  Right flank pain and urinary frequency EXAM: CT ABDOMEN AND PELVIS WITHOUT CONTRAST TECHNIQUE: Multidetector CT imaging of the abdomen and pelvis was performed following the standard protocol without IV contrast. COMPARISON:  None. FINDINGS: Lower chest: There is scarring in the left lung base. There is no edema or consolidation in the lung bases. Hepatobiliary: There is a small apparent  granuloma in the left lobe of the liver. Beyond this apparent small granuloma, no focal liver lesions are evident on this noncontrast enhanced study. There is cholelithiasis. The gallbladder wall is not appreciably thickened. There is no biliary duct dilatation evident. Pancreas: There is no evident pancreatic mass or inflammatory focus. Pancreas is diffusely atrophic. Spleen: No splenic lesions are evident. Adrenals/Urinary Tract: Adrenals bilaterally appear unremarkable. There is a cyst arising from the anterior aspect of the mid right kidney measuring 7.1 x 5.1 cm. There is mild to moderate hydronephrosis on the right. There is no hydronephrosis on the left. There is a nonobstructing 1 mm calculus in the mid left kidney. There is a calculus at the right ureteropelvic junction measuring 1.2 x 0.8 cm. No other ureteral calculi are evident. Urinary bladder is midline. Urinary bladder wall thickness is upper normal. Stomach/Bowel: There is no appreciable bowel wall or mesenteric thickening. There is no evident bowel obstruction. Terminal ileum appears normal. There is no evident free air or portal venous air. Vascular/Lymphatic: No abdominal aortic aneurysm. There are multiple foci aortic  and mesenteric arterial vascular calcification. There is no appreciable adenopathy in the abdomen or pelvis. Reproductive: Prostate and seminal vesicles appear normal in size and contour. No pelvic mass evident. Other: Appendix appears unremarkable. Note that the appendix extends into the lateral upper right abdomen. No abscess or ascites is evident in the abdomen or pelvis. There is a small periumbilical hernia containing fat and probable mild panniculitis. There is fat in each inguinal ring. No bowel containing hernia demonstrated. There are small nodular appearing areas along the omentum on the left anteriorly, largest measuring 1.5 x 0.8 cm, seen on axial slice 58 series 2. Musculoskeletal: There is extensive postoperative  change from L3-S1. There is degenerative change in the lumbar spine. There are no blastic or lytic bone lesions. There is fat in portions of each sartorius muscle. Beyond this fat, no intramuscular lesions are evident. IMPRESSION: 1. Mild to moderate hydronephrosis on the right. 1.2 x 0.8 cm calculus at the right ureteropelvic junction. 2.  Nonobstructing 1 mm calculus in mid left kidney. 3. Urinary bladder wall thickness is upper normal. Advise correlation with urinalysis to assess for possible early cystitis. 4.  Cholelithiasis. 5. Small nodular appearing areas along the anterior left abdominal wall, likely of omental etiology. Etiology for these lesions uncertain. It should be noted that small omental metastases could present in this manner. Given this finding in the association of omental lesions with colon carcinoma, it would be prudent to consider direct colonic visualization after appropriate colonic cleansing. 6. No bowel obstruction. No abscess in the abdomen or pelvis. Appendix appears normal. 7. Small periumbilical hernia containing fat and mild panniculitis. No bowel containing hernia evident. 8.  Extensive postoperative change from L3-S1. Electronically Signed   By: Lowella Grip III M.D.   On: 09/02/2018 10:19    ____________________________________________   PROCEDURES  Procedure(s) performed:   Procedures  None ____________________________________________   INITIAL IMPRESSION / ASSESSMENT AND PLAN / ED COURSE  Pertinent labs & imaging results that were available during my care of the patient were reviewed by me and considered in my medical decision making (see chart for details).   Patient presents to the emergency department for evaluation of intermittent right flank pain over the past 6 months with increased urine frequency.  No other symptoms of UTI.  He was referred here by the [facility].  His foreskin appears to have scar tissue in place which is keeping him from retracting his  foreskin but this has been present for at least 6 months.  No changes in skin color to the glans.  No foreskin edema or evidence of infection.  Discussed need for urology follow-up but no acute interventions at this time.  Given his history of complicated kidney stones, I do plan for CT renal to evaluate his intermittent right flank pain.  UA pending with increased urine frequency but overall low suspicion for infected stone.   UA without infection. CT shows proximal stone with hydro. Patient without significant pain and approx 6 months of symptoms at this point. Will call urology for urgent f/u. Discussed ED return precautions in detail. Also discussed omental mass. Patient to call GI for short interval colonoscopy.  ____________________________________________  FINAL CLINICAL IMPRESSION(S) / ED DIAGNOSES  Final diagnoses:  Kidney stone  Omental mass     NEW OUTPATIENT MEDICATIONS STARTED DURING THIS VISIT:  Discharge Medication List as of 09/02/2018 10:58 AM    START taking these medications   Details  tamsulosin (FLOMAX) 0.4 MG CAPS capsule Take 1  capsule (0.4 mg total) by mouth daily for 14 days., Starting Fri 09/02/2018, Until Fri 09/16/2018, Normal        Note:  This document was prepared using Dragon voice recognition software and may include unintentional dictation errors.  Nanda Quinton, MD Emergency Medicine    Keirra Zeimet, [facility], MD 09/03/18 [phone]

## 2018-09-02 NOTE — Discharge Instructions (Signed)
You were seen in the emergency department today with pain on the right side.  You do have a kidney stone there which is likely causing your symptoms.  You are not showing sign of infection.  Please take over-the-counter pain medication along with the Flomax prescribed and call the urologist first thing today to schedule the next available appointment.  Return to the emergency department if you develop fever, inability to urinate, severe pain.   Your CT scan also showed small masses along the front of your abdomen.  It is not clear what these are but the radiologist is recommending that you follow with your PCP and gastroenterology to arrange for colonoscopy.  I have listed the name of the gastroenterology doctor on this paperwork.  Please call to set up an appointment.  Your insurance may require that your PCP provide a referral but please be sure to follow-up on this promptly.

## 2018-09-02 NOTE — ED Triage Notes (Signed)
Patient presents to the ED with intermittent right flank pain for 6 months.  Patient reports urinary frequency.  Patient reports penile swelling. Patient has been at the [facility] in [place] for same thing.  Patient was advised to come to the [facility] with no explanation.

## 2018-09-02 NOTE — ED Notes (Signed)
Pt transported to CT ?

## 2018-09-02 NOTE — ED Notes (Signed)
Pt returned from CT °

## 2018-09-05 ENCOUNTER — Other Ambulatory Visit (HOSPITAL_COMMUNITY)
Admission: RE | Admit: 2018-09-05 | Discharge: 2018-09-05 | Disposition: A | Discharge status: Home / Self Care | Payer: Medicare Other | Source: Ambulatory Visit | Attending: Urology | Admitting: Urology

## 2018-09-05 ENCOUNTER — Other Ambulatory Visit: Payer: Self-pay

## 2018-09-05 ENCOUNTER — Other Ambulatory Visit (HOSPITAL_COMMUNITY): Payer: No Typology Code available for payment source

## 2018-09-05 ENCOUNTER — Other Ambulatory Visit: Payer: Self-pay | Admitting: Urology

## 2018-09-05 DIAGNOSIS — Z01812 Encounter for preprocedural laboratory examination: Secondary | ICD-10-CM | POA: Insufficient documentation

## 2018-09-05 DIAGNOSIS — N471 Phimosis: Secondary | ICD-10-CM | POA: Insufficient documentation

## 2018-09-05 DIAGNOSIS — Z20828 Contact with and (suspected) exposure to other viral communicable diseases: Secondary | ICD-10-CM | POA: Diagnosis not present

## 2018-09-05 DIAGNOSIS — N201 Calculus of ureter: Secondary | ICD-10-CM | POA: Diagnosis not present

## 2018-09-05 DIAGNOSIS — R1084 Generalized abdominal pain: Secondary | ICD-10-CM | POA: Diagnosis not present

## 2018-09-05 DIAGNOSIS — N130 Hydronephrosis with ureteropelvic junction obstruction: Secondary | ICD-10-CM | POA: Insufficient documentation

## 2018-09-05 LAB — SARS CORONAVIRUS 2 (TAT 6-24 HRS): SARS Coronavirus 2: NEGATIVE

## 2018-09-06 ENCOUNTER — Other Ambulatory Visit: Payer: Self-pay

## 2018-09-06 ENCOUNTER — Encounter (HOSPITAL_BASED_OUTPATIENT_CLINIC_OR_DEPARTMENT_OTHER): Payer: Self-pay | Admitting: *Deleted

## 2018-09-06 NOTE — Progress Notes (Signed)
Spoke w/ pt via phone for pre-op interview.  Npo after mn w/ exception clear liquids until 0800 (diet drinks/ no surgar added) then nothing by mouth, pt verbalized understanding.  Arrive at 1200.  Needs istat 8 and ekg.  Will take norvasc am dos w/ sips of water.  Pt verbalized understanding do not take diabetic medication morning of surgery and do half lantus insulin dose tonight before (20 units).  Pt had covid test done 09-05-2018.  Pt denies cardiac s&s, sob, or difficulty breathing.

## 2018-09-07 ENCOUNTER — Encounter (HOSPITAL_BASED_OUTPATIENT_CLINIC_OR_DEPARTMENT_OTHER): Payer: Self-pay | Admitting: General Practice

## 2018-09-07 ENCOUNTER — Encounter (HOSPITAL_BASED_OUTPATIENT_CLINIC_OR_DEPARTMENT_OTHER)
Admission: RE | Disposition: A | Discharge status: Home / Self Care | Payer: Self-pay | Source: Home / Self Care | Attending: Urology

## 2018-09-07 ENCOUNTER — Ambulatory Visit (HOSPITAL_BASED_OUTPATIENT_CLINIC_OR_DEPARTMENT_OTHER): Payer: Medicare Other | Admitting: Anesthesiology

## 2018-09-07 ENCOUNTER — Ambulatory Visit (HOSPITAL_BASED_OUTPATIENT_CLINIC_OR_DEPARTMENT_OTHER)
Admission: RE | Admit: 2018-09-07 | Discharge: 2018-09-07 | Disposition: A | Discharge status: Home / Self Care | Payer: Medicare Other | Attending: Urology | Admitting: Urology

## 2018-09-07 ENCOUNTER — Other Ambulatory Visit: Payer: Self-pay

## 2018-09-07 DIAGNOSIS — E1122 Type 2 diabetes mellitus with diabetic chronic kidney disease: Secondary | ICD-10-CM | POA: Insufficient documentation

## 2018-09-07 DIAGNOSIS — Z794 Long term (current) use of insulin: Secondary | ICD-10-CM | POA: Insufficient documentation

## 2018-09-07 DIAGNOSIS — I129 Hypertensive chronic kidney disease with stage 1 through stage 4 chronic kidney disease, or unspecified chronic kidney disease: Secondary | ICD-10-CM | POA: Insufficient documentation

## 2018-09-07 DIAGNOSIS — F419 Anxiety disorder, unspecified: Secondary | ICD-10-CM | POA: Diagnosis not present

## 2018-09-07 DIAGNOSIS — Z7982 Long term (current) use of aspirin: Secondary | ICD-10-CM | POA: Diagnosis not present

## 2018-09-07 DIAGNOSIS — E785 Hyperlipidemia, unspecified: Secondary | ICD-10-CM | POA: Diagnosis not present

## 2018-09-07 DIAGNOSIS — Z79899 Other long term (current) drug therapy: Secondary | ICD-10-CM | POA: Diagnosis not present

## 2018-09-07 DIAGNOSIS — Z87891 Personal history of nicotine dependence: Secondary | ICD-10-CM | POA: Diagnosis not present

## 2018-09-07 DIAGNOSIS — E7800 Pure hypercholesterolemia, unspecified: Secondary | ICD-10-CM | POA: Diagnosis not present

## 2018-09-07 DIAGNOSIS — I10 Essential (primary) hypertension: Secondary | ICD-10-CM | POA: Diagnosis not present

## 2018-09-07 DIAGNOSIS — N132 Hydronephrosis with renal and ureteral calculous obstruction: Secondary | ICD-10-CM | POA: Insufficient documentation

## 2018-09-07 DIAGNOSIS — R109 Unspecified abdominal pain: Secondary | ICD-10-CM | POA: Diagnosis present

## 2018-09-07 DIAGNOSIS — N471 Phimosis: Secondary | ICD-10-CM | POA: Diagnosis not present

## 2018-09-07 DIAGNOSIS — E1140 Type 2 diabetes mellitus with diabetic neuropathy, unspecified: Secondary | ICD-10-CM | POA: Diagnosis not present

## 2018-09-07 DIAGNOSIS — N189 Chronic kidney disease, unspecified: Secondary | ICD-10-CM | POA: Insufficient documentation

## 2018-09-07 DIAGNOSIS — N201 Calculus of ureter: Secondary | ICD-10-CM | POA: Diagnosis not present

## 2018-09-07 HISTORY — DX: Personal history of other diseases of the respiratory system: Z87.09

## 2018-09-07 HISTORY — DX: Presence of external hearing-aid: Z97.4

## 2018-09-07 HISTORY — DX: Personal history of other medical treatment: Z92.89

## 2018-09-07 HISTORY — DX: Complete loss of teeth, unspecified cause, unspecified class: K08.109

## 2018-09-07 HISTORY — DX: Unspecified osteoarthritis, unspecified site: M19.90

## 2018-09-07 HISTORY — PX: CIRCUMCISION: SHX1350

## 2018-09-07 HISTORY — DX: Personal history of urinary calculi: Z87.442

## 2018-09-07 HISTORY — DX: Type 2 diabetes mellitus with diabetic neuropathy, unspecified: E11.40

## 2018-09-07 HISTORY — DX: Phimosis: N47.1

## 2018-09-07 HISTORY — DX: Spinal stenosis, lumbar region with neurogenic claudication: M48.062

## 2018-09-07 HISTORY — DX: Calculus of ureter: N20.1

## 2018-09-07 HISTORY — DX: Disorder of kidney and ureter, unspecified: N28.9

## 2018-09-07 HISTORY — DX: Presence of spectacles and contact lenses: Z97.3

## 2018-09-07 HISTORY — PX: CYSTOSCOPY/URETEROSCOPY/HOLMIUM LASER/STENT PLACEMENT: SHX6546

## 2018-09-07 HISTORY — DX: Type 2 diabetes mellitus without complications: E11.9

## 2018-09-07 LAB — POCT I-STAT, CHEM 8
BUN: 26 mg/dL — ABNORMAL HIGH (ref 8–23)
Calcium, Ion: 1.28 mmol/L (ref 1.15–1.40)
Chloride: 104 mmol/L (ref 98–111)
Creatinine, Ser: 1.2 mg/dL (ref 0.61–1.24)
Glucose, Bld: 72 mg/dL (ref 70–99)
HCT: 40 % (ref 39.0–52.0)
Hemoglobin: 13.6 g/dL (ref 13.0–17.0)
Potassium: 3.9 mmol/L (ref 3.5–5.1)
Sodium: 143 mmol/L (ref 135–145)
TCO2: 25 mmol/L (ref 22–32)

## 2018-09-07 LAB — GLUCOSE, CAPILLARY
Glucose-Capillary: 64 mg/dL — ABNORMAL LOW (ref 70–99)
Glucose-Capillary: 66 mg/dL — ABNORMAL LOW (ref 70–99)
Glucose-Capillary: 120 mg/dL — ABNORMAL HIGH (ref 70–99)
Glucose-Capillary: 171 mg/dL — ABNORMAL HIGH (ref 70–99)

## 2018-09-07 SURGERY — CYSTOSCOPY/URETEROSCOPY/HOLMIUM LASER/STENT PLACEMENT
Anesthesia: General | Laterality: Right

## 2018-09-07 MED ORDER — OXYCODONE HCL 5 MG PO TABS
5.0000 mg | ORAL_TABLET | Freq: Once | ORAL | Status: DC | PRN
  Filled 2018-09-07: qty 1

## 2018-09-07 MED ORDER — EPHEDRINE SULFATE 50 MG/ML IJ SOLN
INTRAMUSCULAR | Status: DC | PRN
  Administered 2018-09-07 (×2): 10 mg via INTRAVENOUS

## 2018-09-07 MED ORDER — HYDROMORPHONE HCL 1 MG/ML IJ SOLN
0.2500 mg | INTRAMUSCULAR | Status: DC | PRN
  Filled 2018-09-07: qty 0.5

## 2018-09-07 MED ORDER — BACITRACIN 500 UNIT/GM EX OINT
TOPICAL_OINTMENT | CUTANEOUS | Status: DC | PRN
  Administered 2018-09-07: 1 via TOPICAL

## 2018-09-07 MED ORDER — PROPOFOL 10 MG/ML IV BOLUS
INTRAVENOUS | Status: AC
  Filled 2018-09-07: qty 20

## 2018-09-07 MED ORDER — MIDAZOLAM HCL 5 MG/5ML IJ SOLN: INTRAMUSCULAR | Status: DC | PRN

## 2018-09-07 MED ORDER — DEXAMETHASONE SODIUM PHOSPHATE 10 MG/ML IJ SOLN
INTRAMUSCULAR | Status: AC
  Filled 2018-09-07: qty 1

## 2018-09-07 MED ORDER — PROMETHAZINE HCL 25 MG/ML IJ SOLN
6.2500 mg | INTRAMUSCULAR | Status: DC | PRN
  Filled 2018-09-07: qty 1

## 2018-09-07 MED ORDER — HYDROCODONE-ACETAMINOPHEN 5-325 MG PO TABS: ORAL_TABLET | ORAL | 0 refills | Status: DC

## 2018-09-07 MED ORDER — FENTANYL CITRATE (PF) 100 MCG/2ML IJ SOLN
INTRAMUSCULAR | Status: AC
  Filled 2018-09-07: qty 2

## 2018-09-07 MED ORDER — ONDANSETRON HCL 4 MG/2ML IJ SOLN
INTRAMUSCULAR | Status: AC
  Filled 2018-09-07: qty 2

## 2018-09-07 MED ORDER — FENTANYL CITRATE (PF) 100 MCG/2ML IJ SOLN
INTRAMUSCULAR | Status: DC | PRN
  Administered 2018-09-07: 50 ug via INTRAVENOUS
  Administered 2018-09-07 (×2): 25 ug via INTRAVENOUS
  Administered 2018-09-07: 50 ug via INTRAVENOUS
  Administered 2018-09-07 (×2): 25 ug via INTRAVENOUS

## 2018-09-07 MED ORDER — LIDOCAINE 2% (20 MG/ML) 5 ML SYRINGE
INTRAMUSCULAR | Status: AC
  Filled 2018-09-07: qty 5

## 2018-09-07 MED ORDER — PHENAZOPYRIDINE HCL 200 MG PO TABS: 200.0000 mg | ORAL_TABLET | Freq: Three times a day (TID) | ORAL | 0 refills | Status: DC | PRN

## 2018-09-07 MED ORDER — DEXAMETHASONE SODIUM PHOSPHATE 4 MG/ML IJ SOLN
INTRAMUSCULAR | Status: DC | PRN
  Administered 2018-09-07: 5 mg via INTRAVENOUS

## 2018-09-07 MED ORDER — BUPIVACAINE HCL 0.5 % IJ SOLN
INTRAMUSCULAR | Status: DC | PRN
  Administered 2018-09-07: 10 mL

## 2018-09-07 MED ORDER — IOHEXOL 300 MG/ML  SOLN
INTRAMUSCULAR | Status: DC | PRN
  Administered 2018-09-07: 15:00:00 10 mL via URETHRAL

## 2018-09-07 MED ORDER — CEFAZOLIN SODIUM-DEXTROSE 2-4 GM/100ML-% IV SOLN
INTRAVENOUS | Status: AC
  Filled 2018-09-07: qty 100

## 2018-09-07 MED ORDER — CEFAZOLIN SODIUM-DEXTROSE 2-4 GM/100ML-% IV SOLN
2.0000 g | Freq: Once | INTRAVENOUS | Status: AC
  Administered 2018-09-07: 2 g via INTRAVENOUS
  Filled 2018-09-07: qty 100

## 2018-09-07 MED ORDER — OXYCODONE HCL 5 MG/5ML PO SOLN
5.0000 mg | Freq: Once | ORAL | Status: DC | PRN
  Filled 2018-09-07: qty 5

## 2018-09-07 MED ORDER — PROPOFOL 10 MG/ML IV BOLUS
INTRAVENOUS | Status: DC | PRN
  Administered 2018-09-07: 200 mg via INTRAVENOUS
  Administered 2018-09-07: 50 mg via INTRAVENOUS

## 2018-09-07 MED ORDER — CEPHALEXIN 500 MG PO CAPS: 500.0000 mg | ORAL_CAPSULE | Freq: Two times a day (BID) | ORAL | 0 refills | Status: AC

## 2018-09-07 MED ORDER — ONDANSETRON HCL 4 MG/2ML IJ SOLN
INTRAMUSCULAR | Status: DC | PRN
  Administered 2018-09-07: 4 mg via INTRAVENOUS

## 2018-09-07 MED ORDER — EPHEDRINE 5 MG/ML INJ
INTRAVENOUS | Status: AC
  Filled 2018-09-07: qty 10

## 2018-09-07 MED ORDER — LACTATED RINGERS IV SOLN
INTRAVENOUS | Status: DC
  Administered 2018-09-07 (×3): via INTRAVENOUS
  Filled 2018-09-07: qty 1000

## 2018-09-07 MED ORDER — LIDOCAINE HCL (CARDIAC) PF 100 MG/5ML IV SOSY
PREFILLED_SYRINGE | INTRAVENOUS | Status: DC | PRN
  Administered 2018-09-07: 80 mg via INTRAVENOUS

## 2018-09-07 SURGICAL SUPPLY — 54 items
BAG DRAIN URO-CYSTO SKYTR STRL (DRAIN) ×3 IMPLANT
BANDAGE COBAN STERILE 2 (GAUZE/BANDAGES/DRESSINGS) ×3 IMPLANT
BASKET STONE 1.7 NGAGE (UROLOGICAL SUPPLIES) IMPLANT
BASKET ZERO TIP NITINOL 2.4FR (BASKET) ×3 IMPLANT
BENZOIN TINCTURE PRP APPL 2/3 (GAUZE/BANDAGES/DRESSINGS) IMPLANT
BLADE SURG 15 STRL LF DISP TIS (BLADE) ×2 IMPLANT
BLADE SURG 15 STRL SS (BLADE) ×1
BNDG COHESIVE 2X5 TAN NS LF (GAUZE/BANDAGES/DRESSINGS) ×3 IMPLANT
BNDG CONFORM 2 STRL LF (GAUZE/BANDAGES/DRESSINGS) ×3 IMPLANT
CATH URET 5FR 28IN OPEN ENDED (CATHETERS) IMPLANT
CLOTH BEACON ORANGE TIMEOUT ST (SAFETY) ×3 IMPLANT
COVER BACK TABLE 60X90IN (DRAPES) ×3 IMPLANT
COVER MAYO STAND STRL (DRAPES) ×3 IMPLANT
COVER WAND RF STERILE (DRAPES) ×6 IMPLANT
DECANTER SPIKE VIAL GLASS SM (MISCELLANEOUS) IMPLANT
DRAPE LAPAROTOMY 100X72 PEDS (DRAPES) ×3 IMPLANT
ELECT NEEDLE TIP 2.8 STRL (NEEDLE) ×3 IMPLANT
ELECT REM PT RETURN 9FT ADLT (ELECTROSURGICAL) ×3
ELECTRODE REM PT RTRN 9FT ADLT (ELECTROSURGICAL) ×2 IMPLANT
FIBER LASER FLEXIVA 365 (UROLOGICAL SUPPLIES) IMPLANT
FIBER LASER TRAC TIP (UROLOGICAL SUPPLIES) ×3 IMPLANT
GAUZE PETROLATUM 1 X8 (GAUZE/BANDAGES/DRESSINGS) ×3 IMPLANT
GAUZE XEROFORM 1X8 LF (GAUZE/BANDAGES/DRESSINGS) ×3 IMPLANT
GLOVE BIO SURGEON STRL SZ7.5 (GLOVE) ×3 IMPLANT
GLOVE BIOGEL M STRL SZ7.5 (GLOVE) ×3 IMPLANT
GLOVE BIOGEL PI IND STRL 7.5 (GLOVE) ×2 IMPLANT
GLOVE BIOGEL PI INDICATOR 7.5 (GLOVE) ×1
GOWN STRL REUS W/ TWL XL LVL3 (GOWN DISPOSABLE) ×2 IMPLANT
GOWN STRL REUS W/TWL XL LVL3 (GOWN DISPOSABLE) ×4 IMPLANT
GUIDEWIRE STR DUAL SENSOR (WIRE) ×3 IMPLANT
GUIDEWIRE ZIPWRE .038 STRAIGHT (WIRE) ×3 IMPLANT
IV NS 1000ML (IV SOLUTION)
IV NS 1000ML BAXH (IV SOLUTION) IMPLANT
IV NS IRRIG 3000ML ARTHROMATIC (IV SOLUTION) ×3 IMPLANT
KIT TURNOVER CYSTO (KITS) ×3 IMPLANT
MANIFOLD NEPTUNE II (INSTRUMENTS) ×3 IMPLANT
NEEDLE HYPO 25X1 1.5 SAFETY (NEEDLE) IMPLANT
NS IRRIG 500ML POUR BTL (IV SOLUTION) ×6 IMPLANT
PACK BASIN DAY SURGERY FS (CUSTOM PROCEDURE TRAY) ×3 IMPLANT
PACK CYSTO (CUSTOM PROCEDURE TRAY) ×3 IMPLANT
PENCIL BUTTON HOLSTER BLD 10FT (ELECTRODE) ×3 IMPLANT
STENT URET 6FRX26 CONTOUR (STENTS) ×3 IMPLANT
STRIP CLOSURE SKIN 1/2X4 (GAUZE/BANDAGES/DRESSINGS) IMPLANT
SUT CHROMIC 3 0 SH 27 (SUTURE) ×6 IMPLANT
SUT PROLENE 4 0 RB 1 (SUTURE) ×1
SUT PROLENE 4-0 RB1 .5 CRCL 36 (SUTURE) ×2 IMPLANT
SUT VICRYL 4-0 PS2 18IN ABS (SUTURE) IMPLANT
SYR 10ML LL (SYRINGE) ×3 IMPLANT
SYR CONTROL 10ML LL (SYRINGE) IMPLANT
TOWEL OR 17X26 10 PK STRL BLUE (TOWEL DISPOSABLE) ×3 IMPLANT
TRAY DSU PREP LF (CUSTOM PROCEDURE TRAY) ×3 IMPLANT
TUBE CONNECTING 12X1/4 (SUCTIONS) IMPLANT
TUBING UROLOGY SET (TUBING) ×3 IMPLANT
WATER STERILE IRR 500ML POUR (IV SOLUTION) IMPLANT

## 2018-09-07 NOTE — Anesthesia Postprocedure Evaluation (Signed)
Anesthesia Post Note  Patient: Logan Hooper  Procedure(s) Performed: CYSTOSCOPY/RETROGRADE/URETEROSCOPY/HOLMIUM LASER/STENT PLACEMENT (Right ) CIRCUMCISION ADULT (N/A )     Patient location during evaluation: PACU Anesthesia Type: General Level of consciousness: awake and alert Pain management: pain level controlled Vital Signs Assessment: post-procedure vital signs reviewed and stable Respiratory status: spontaneous breathing, nonlabored ventilation, respiratory function stable and patient connected to nasal cannula oxygen Cardiovascular status: blood pressure returned to baseline and stable Postop Assessment: no apparent nausea or vomiting Anesthetic complications: no    Last Vitals:  Vitals:   09/07/18 1600 09/07/18 1615  BP: (!) 161/68   Pulse: 75 77  Resp: (!) 22 18  Temp:    SpO2: 100% 94%    Last Pain:  Vitals:   09/07/18 1558  TempSrc:   PainSc: 0-No pain                 [name]

## 2018-09-07 NOTE — Op Note (Signed)
Operative Note  Preoperative diagnosis:  1.  12 mm right UPJ stone 2.  Phimosis  Postoperative diagnosis: 1.  12 mm right UPJ stone 2.  Phimosis  Procedure(s): 1.  Cystoscopy with right ureteroscopy, holmium laser lithotripsy and right JJ stent placement 2.  Right retrograde pyelogram with intraoperative interpretation of fluoroscopic imaging 3.  Adult circumcision  Surgeon: Ellison Hughs, MD  Assistants:  None  Anesthesia:  General  Complications:  None  EBL: 5 mL  Specimens: 1.  Foreskin  Drains/Catheters: 1.  Right 6 French, 26 cm JJ stent without tether  Intraoperative findings:   1. Right retrograde pyelogram revealed no filling defects along the distal aspects of the right ureter, but did demonstrate proximal dilation of the ureter as well as mild dilation of the right renal pelvis.  There was a filling defect seen within the proximal aspects of the right ureter, consistent with his obstructing stone. 2.  Phimosis  Indication:  Logan Hooper is a 73 y.o. male with a 12 mm right UPJ stone as well as phimosis of the penile foreskin.  He has been consented for the above procedures, voices understanding and wishes to proceed.  Description of procedure:  After informed consent was obtained, the patient was brought to the operating room and general LMA anesthesia was administered. The patient was then placed in the dorsolithotomy position and prepped and draped in the usual sterile fashion. A timeout was performed. A 23 French rigid cystoscope was then inserted into the urethral meatus and advanced into the bladder under direct vision. A complete bladder survey revealed no intravesical pathology.  A 5 French ureteral catheter was then inserted into the right ureteral orifice and a retrograde pyelogram was obtained, with the findings listed above.  A Glidewire was then used to intubate the lumen of the ureteral catheter and was advanced up to the right renal pelvis,  under fluoroscopic guidance.  The catheter was then removed, leaving the wire in place.  A flexible ureteroscope was then advanced alongside the wire and up to the level of his UPJ stone, which promptly migrated into a lower pole calyx.  A 200 m holmium laser was then used to dust the stone.  Full inspection of all the right renal calyces revealed no large stone fragments.  The flexible ureteroscope was then removed, leaving the wire in place.  A 6 French, 26 cm JJ stent was then placed over the wire and into good position within the right collecting system, confirming placement via fluoroscopy.  The patient's bladder was then completely drained.  The penis with then reprepped with Betadine.  Circumferential marks were then made with the first being with the foreskin in the anatomic position along the glandular impression on the second being with the foreskin retracted approximately 1 cm from the coronal sulcus.  The marks were then incised using electrocautery and the excess foreskin was excised.  The penile shaft skin was then reapproximated using interrupted 3-0 chromic suture.  A penile ring block was then performed using quarter percent Marcaine without epinephrine.  The penis was then dressed in the usual fashion.  The patient tolerated the procedure well and was transferred to the postanesthesia unit in stable condition.  Plan: Remove dressing in 24 hours.  Follow-up in 1 week for office cystoscopy and stent removal

## 2018-09-07 NOTE — Progress Notes (Signed)
Hypoglycemic Event  CBG: 64  Treatment: 8 oz juice/soda  Symptoms: None  Follow-up CBG: Time: 1646 CBG Result:120  Possible Reasons for Event: Inadequate meal intake and Medication regimen: Patient took 20 units of Lantus at bedtime last night and has been NPO since midnight.  Comments/MD notified:Patient awake, alert, and oriented. Asymptomatic w/ hypoglycemia in the 60s. Will continue to monitor.   Logan Hooper

## 2018-09-07 NOTE — Transfer of Care (Signed)
Immediate Anesthesia Transfer of Care Note  Patient: Logan Hooper  Procedure(s) Performed: Procedure(s) (LRB): CYSTOSCOPY/RETROGRADE/URETEROSCOPY/HOLMIUM LASER/STENT PLACEMENT (Right) CIRCUMCISION ADULT (N/A)  Patient Location: PACU  Anesthesia Type: General  Level of Consciousness: awake, sedated, patient cooperative and responds to stimulation  Airway & Oxygen Therapy: Patient Spontanous Breathing and Patient connected to [place] O2 and soft face mask  Post-op Assessment: Report given to PACU RN, Post -op Vital signs reviewed and stable and Patient moving all extremities  Post vital signs: Reviewed and stable  Complications: No apparent anesthesia complications

## 2018-09-07 NOTE — Anesthesia Procedure Notes (Signed)
Procedure Name: LMA Insertion Date/Time: 09/07/2018 2:35 PM Performed by: Justice Rocher, CRNA Pre-anesthesia Checklist: Patient identified, Emergency Drugs available, Suction available and Patient being monitored Patient Re-evaluated:Patient Re-evaluated prior to induction Oxygen Delivery Method: Circle system utilized Preoxygenation: Pre-oxygenation with 100% oxygen Induction Type: IV induction Ventilation: Mask ventilation without difficulty LMA: LMA inserted LMA Size: 5.0 Number of attempts: 1 Airway Equipment and Method: Bite block Placement Confirmation: positive ETCO2 and breath sounds checked- equal and bilateral Tube secured with: Tape Dental Injury: Teeth and Oropharynx as per pre-operative assessment

## 2018-09-07 NOTE — Anesthesia Preprocedure Evaluation (Signed)
Anesthesia Evaluation  Patient identified by MRN, date of birth, ID band Patient awake    Reviewed: Allergy & Precautions, H&P , Patient's Chart, lab work & pertinent test results, reviewed documented beta blocker date and time   Airway Mallampati: II  TM Distance: >3 FB Neck ROM: full    Dental no notable dental hx.    Pulmonary former smoker,    Pulmonary exam normal breath sounds clear to auscultation       Cardiovascular hypertension, On Medications  Rhythm:regular Rate:Normal     Neuro/Psych Anxiety    GI/Hepatic   Endo/Other  diabetes, Insulin Dependent  Renal/GU      Musculoskeletal   Abdominal (+) + obese,   Peds  Hematology   Anesthesia Other Findings   Reproductive/Obstetrics                             Anesthesia Physical  Anesthesia Plan  ASA: III  Anesthesia Plan: General   Post-op Pain Management:    Induction: Intravenous  PONV Risk Score and Plan: 2 and Ondansetron, Midazolam and Treatment may vary due to age or medical condition  Airway Management Planned: LMA  Additional Equipment:   Intra-op Plan:   Post-operative Plan: Extubation in OR  Informed Consent: I have reviewed the patients History and Physical, chart, labs and discussed the procedure including the risks, benefits and alternatives for the proposed anesthesia with the patient or authorized representative who has indicated his/her understanding and acceptance.     Dental Advisory Given and Dental advisory given  Plan Discussed with: CRNA and Surgeon  Anesthesia Plan Comments: (  Discussed general anesthesia, including possible nausea, instrumentation of airway, sore throat,pulmonary aspiration, etc. I asked if the were any outstanding questions, or  concerns before we proceeded. )        Anesthesia Quick Evaluation

## 2018-09-07 NOTE — Progress Notes (Signed)
Hypoglycemic Event  CBG: 66  Treatment: 4 oz juice/soda  Symptoms: None  Follow-up CBG: Time:1625 CBG Result: 64  Possible Reasons for Event: Inadequate meal intake (NPO since midnight for surgery)  Comments/MD notified: Patient awake, alert, and oriented. Denies symptoms of hypoglycemia. Reports his usual level of hypoglycemia awareness is below 50 mg/dL. Will continue to treat per protocol.  Dorrene German

## 2018-09-07 NOTE — Discharge Instructions (Signed)

## 2018-09-07 NOTE — H&P (Signed)
PRE-OP H&P  CC: I have pain in the flank.  HPI: Logan Hooper is a 73 year-old male patient who is here for flank pain.  The problem is on the right side. His pain started about approximately 03/06/2018. The pain is dull. The intensity of his pain is rated as a 4. The pain is intermittent. The pain does radiate.   Sitting< makes the pain better. Nothing causes the pain to become worse. He was treated with the following pain medication(s): Tylenol and Ibuprofen.   He has not had this same pain previously. He has had kidney stones.   CTSS from 09/02/18 shows a 1.2 cm right UPJ stones   Hx of CKD, HTN, DM2, HLD     CC: I have trouble rolling my foreskin back.  HPI: He has had difficulties with rolling his foreskin back for 6 months. He was last able to easily roll his foreskin back approximately 03/06/2018. His symptoms have gotten worse over the last year.   He does have dysuria. He has had inflammation of the head of his penis. He has had to use creams on the head of his penis.   He does have to strain or bear down to start his urinary stream. He does not have a good size and strength to his urinary stream. His urinary stream does start and stop during voiding.   He would like to be circumcised.     ALLERGIES: None   MEDICATIONS: Tamsulosin Hcl 0.4 mg capsule  Amlodipine Besilate  Aspir 81  Glargine Insulin  Losartan Potassium  Metformin Er Gastric  Multiple Vitamin  Prazosin Hcl  Sertraline Hcl     GU PSH: Cysto Uretero Lithotripsy     NON-GU PSH: Rotator cuff surgery, Bilateral Total Knee Replacement, Right     GU PMH: None   NON-GU PMH: Arthritis Diabetes Type 2 Hypercholesterolemia Hypertension    FAMILY HISTORY: Heart Attack - Mother   SOCIAL HISTORY: Marital Status: Married Economist.     REVIEW OF SYSTEMS:    GU Review Male:   Patient reports frequent urination, hard to postpone urination, get up at night to urinate, and leakage of urine. Patient  denies burning/ pain with urination, stream starts and stops, trouble starting your stream, have to strain to urinate , erection problems, and penile pain.  Gastrointestinal (Upper):   Patient denies nausea, vomiting, and indigestion/ heartburn.  Gastrointestinal (Lower):   Patient reports diarrhea. Patient denies constipation.  Constitutional:   Patient denies night sweats, fatigue, fever, and weight loss.  Skin:   Patient denies skin rash/ lesion and itching.  Eyes:   Patient denies blurred vision and double vision.  Ears/ Nose/ Throat:   Patient denies sore throat and sinus problems.  Hematologic/Lymphatic:   Patient denies swollen glands and easy bruising.  Cardiovascular:   Patient denies leg swelling and chest pains.  Respiratory:   Patient denies cough and shortness of breath.  Endocrine:   Patient denies excessive thirst.  Musculoskeletal:   Patient reports back pain and joint pain.   Neurological:   Patient denies headaches and dizziness.  Psychologic:   Patient denies depression and anxiety.   Notes: history of kidney stones     VITAL SIGNS:      09/06/2018 02:32 PM  BP 165/75 mmHg  Heart Rate 78 /min  Temperature 97.7 F / 36.5 C   GU PHYSICAL EXAMINATION:    Scrotum: No lesions. No edema. No cysts. No warts.  Epididymides: Right: no spermatocele, no  masses, no cysts, no tenderness, no induration, no enlargement. Left: no spermatocele, no masses, no cysts, no tenderness, no induration, no enlargement.  Testes: No tenderness, no swelling, no enlargement left testes. No tenderness, no swelling, no enlargement right testes. Normal location left testes. Normal location right testes. No mass, no cyst, no varicocele, no hydrocele left testes. No mass, no cyst, no varicocele, no hydrocele right testes.  Urethral Meatus: Normal size. No lesion, no wart, no discharge, no polyp. Normal location.  Penis: Penis uncircumcised, phimosis. No foreskin warts, no cracks. No dorsal peyronie's  plaques, no left corporal peyronie's plaques, no right corporal peyronie's plaques, no scarring, no shaft warts. No balanitis, no meatal stenosis.    MULTI-SYSTEM PHYSICAL EXAMINATION:    Constitutional: Well-nourished. No physical deformities. Normally developed. Good grooming.  Neck: Neck symmetrical, not swollen. Normal tracheal position.  Respiratory: No labored breathing, no use of accessory muscles.   Cardiovascular: Normal temperature, normal extremity pulses, no swelling, no varicosities.  Skin: No paleness, no jaundice, no cyanosis. No lesion, no ulcer, no rash.  Neurologic / Psychiatric: Oriented to time, oriented to place, oriented to person. No depression, no anxiety, no agitation.  Gastrointestinal: No mass, no tenderness, no rigidity, non obese abdomen.  Eyes: Normal conjunctivae. Normal eyelids.  Musculoskeletal: Normal gait and station of head and neck.     PAST DATA REVIEWED:  Source Of History:  Patient  Notes:                     CLINICAL DATA: Right flank pain and urinary frequency     EXAM:  CT ABDOMEN AND PELVIS WITHOUT CONTRAST     TECHNIQUE:  Multidetector CT imaging of the abdomen and pelvis was performed  following the standard protocol without IV contrast.     COMPARISON: None.     FINDINGS:  Lower chest: There is scarring in the left lung base. There is no  edema or consolidation in the lung bases.     Hepatobiliary: There is a small apparent granuloma in the left lobe  of the liver. Beyond this apparent small granuloma, no focal liver  lesions are evident on this noncontrast enhanced study. There is  cholelithiasis. The gallbladder wall is not appreciably thickened.  There is no biliary duct dilatation evident.     Pancreas: There is no evident pancreatic mass or inflammatory focus.  Pancreas is diffusely atrophic.     Spleen: No splenic lesions are evident.     Adrenals/Urinary Tract: Adrenals bilaterally appear unremarkable.  There is a cyst  arising from the anterior aspect of the mid right  kidney measuring 7.1 x 5.1 cm. There is mild to moderate  hydronephrosis on the right. There is no hydronephrosis on the left.  There is a nonobstructing 1 mm calculus in the mid left kidney.  There is a calculus at the right ureteropelvic junction measuring  1.2 x 0.8 cm. No other ureteral calculi are evident. Urinary bladder  is midline. Urinary bladder wall thickness is upper normal.     Stomach/Bowel: There is no appreciable bowel wall or mesenteric  thickening. There is no evident bowel obstruction. Terminal ileum  appears normal. There is no evident free air or portal venous air.     Vascular/Lymphatic: No abdominal aortic aneurysm. There are multiple  foci aortic and mesenteric arterial vascular calcification. There is  no appreciable adenopathy in the abdomen or pelvis.     Reproductive: Prostate and seminal vesicles appear normal in size  and contour. No pelvic mass evident.     Other: Appendix appears unremarkable. Note that the appendix extends  into the lateral upper right abdomen. No abscess or ascites is  evident in the abdomen or pelvis. There is a small periumbilical  hernia containing fat and probable mild panniculitis. There is fat  in each inguinal ring. No bowel containing hernia demonstrated.  There are small nodular appearing areas along the omentum on the  left anteriorly, largest measuring 1.5 x 0.8 cm, seen on axial slice  58 series 2.     Musculoskeletal: There is extensive postoperative change from L3-S1.  There is degenerative change in the lumbar spine. There are no  blastic or lytic bone lesions. There is fat in portions of each  sartorius muscle. Beyond this fat, no intramuscular lesions are  evident.     IMPRESSION:  1. Mild to moderate hydronephrosis on the right. 1.2 x 0.8 cm  calculus at the right ureteropelvic junction.     2. Nonobstructing 1 mm calculus in mid left kidney.     3. Urinary  bladder wall thickness is upper normal. Advise  correlation with urinalysis to assess for possible early cystitis.     4. Cholelithiasis.     5. Small nodular appearing areas along the anterior left abdominal  wall, likely of omental etiology. Etiology for these lesions  uncertain. It should be noted that small omental metastases could  present in this manner. Given this finding in the association of  omental lesions with colon carcinoma, it would be prudent to  consider direct colonic visualization after appropriate colonic  cleansing.     6. No bowel obstruction. No abscess in the abdomen or pelvis.  Appendix appears normal.     7. Small periumbilical hernia containing fat and mild panniculitis.  No bowel containing hernia evident.     8. Extensive postoperative change from L3-S1.        Electronically Signed  By: Lowella Grip III M.D.  On: 09/02/2018 10:19   PROCEDURES: None   ASSESSMENT:      ICD-10 Details  1 GU:   Ureteral calculus - N20.1 1.2 cm right UPJ with hydronephrosis  2   Ureteral obstruction secondary to calculous - N13.2   3   Flank Pain - R10.84   4   Phimosis - N47.1    PLAN:           Schedule Return Visit/Planned Activity: ASAP - Schedule Surgery          Document Letter(s):  Created for Patient: Clinical Summary         Notes:   The risks, benefits and alternatives of cystoscopy with circumcision and RIGHT ureteroscopy, laser lithotripsy and ureteral stent placement was discussed the patient. Risks included, but are not limited to: bleeding, urinary tract infection, wound infjection, ureteral injury/avulsion, ureteral stricture formation, retained stone fragments, the possibility that multiple surgeries may be required to treat the stone(s), MI, stroke, PE and the inherent risks of general anesthesia. The patient voices understanding and wishes to proceed.

## 2018-09-08 ENCOUNTER — Encounter (HOSPITAL_BASED_OUTPATIENT_CLINIC_OR_DEPARTMENT_OTHER): Payer: Self-pay | Admitting: Urology

## 2018-09-14 DIAGNOSIS — N201 Calculus of ureter: Secondary | ICD-10-CM | POA: Diagnosis not present

## 2018-09-15 ENCOUNTER — Ambulatory Visit (INDEPENDENT_AMBULATORY_CARE_PROVIDER_SITE_OTHER): Payer: Medicare Other | Admitting: Physician Assistant

## 2018-09-15 ENCOUNTER — Other Ambulatory Visit: Payer: Self-pay

## 2018-09-15 ENCOUNTER — Encounter: Payer: Self-pay | Admitting: Physician Assistant

## 2018-09-15 VITALS — BP 130/60 | HR 85 | Temp 97.8°F | Ht 71.0 in | Wt 235.0 lb

## 2018-09-15 DIAGNOSIS — R935 Abnormal findings on diagnostic imaging of other abdominal regions, including retroperitoneum: Secondary | ICD-10-CM | POA: Diagnosis not present

## 2018-09-15 DIAGNOSIS — K8020 Calculus of gallbladder without cholecystitis without obstruction: Secondary | ICD-10-CM | POA: Diagnosis not present

## 2018-09-15 MED ORDER — NA SULFATE-K SULFATE-MG SULF 17.5-3.13-1.6 GM/177ML PO SOLN: 1.0000 | ORAL | 0 refills | Status: DC

## 2018-09-15 NOTE — Progress Notes (Signed)
Chief Complaint: Abnormal CT of the abdomen  HPI:    Logan Hooper is a 73 year old Caucasian male with a past medical history as listed below, who was referred to me by Mikey Kirschner, MD for a complaint of abnormal CT of the abdomen.      10/23/2014 colonoscopy with Dr. Havery [name] with mild left-sided diverticulosis and otherwise normal.  Biopsies normal.    09/02/2018 CT renal stone study shows mild to moderate hydronephrosis on the right.  Nonobstructing 1 mm calculus in the mid left kidney.  Urinary bladder wall thickness was upper normal.  Cholelithiasis without wall thickening.  Small nodular appearing areas along the anterior left abdomen wall, likely of omental etiology.  Etiology for these lesions uncertain, it should be noted that small omental metastasis could present in this manner.  Given this finding is recommended patient have direct colonic visualization given possible correlation with colon carcinoma.  No bowel obstruction.  No abscess.  Appendix is normal.  Small periumbilical hernia containing fat and mild panniculitis.  No bowel containing hernia evident.  Extensive postoperative change from L3-S1.    Today, the patient tells me that he feels fairly well for "an old man".  Tells me he has regular aches and pains but denies any other complaints today.  Does describe that he recently had a kidney stone which they have "broken up", and all is well now.    Denies fever, chills, weight loss, anorexia, nausea, vomiting, heartburn, reflux, change in bowel habits or blood in his stool.  Past Medical History:  Diagnosis Date   Diabetic neuropathy [facility])    ED (erectile dysfunction)    Essential hypertension    Full dentures    Function kidney decreased    right side per pt   History of kidney stones    History of nuclear stress test    01-23-2014 (in epic) low risk w/ no ischemia, normal LV function and wall motion , ef 55%   History of pleural empyema    s/p  VATS w/  drainage 2009   Hyperlipidemia    Nephrolithiasis    per CT non-obstructive stone left side   Neurogenic claudication due to lumbar spinal stenosis    per pt occasional   OA (osteoarthritis)    Phimosis    PTSD (post-traumatic stress disorder)    Right ureteral stone    Type 2 diabetes mellitus treated with insulin ([place])    followed by pcp   Wears glasses    Wears hearing aid in both ears     Past Surgical History:  Procedure Laterality Date   CIRCUMCISION N/A 09/07/2018   Procedure: CIRCUMCISION ADULT;  Surgeon: Ceasar Mons, MD;  Location: [facility];  Service: Urology;  Laterality: N/A;   COLONOSCOPY  last one 10-23-2014   CYSTOSCOPY/RETROGRADE/URETEROSCOPY  2000 approx.   CYSTOSCOPY/URETEROSCOPY/HOLMIUM LASER/STENT PLACEMENT Right 09/07/2018   Procedure: CYSTOSCOPY/RETROGRADE/URETEROSCOPY/HOLMIUM LASER/STENT PLACEMENT;  Surgeon: Ceasar Mons, MD;  Location: [facility];  Service: Urology;  Laterality: Right;  ONLY NEEDS 90 MIN FOR ALL PROCEDURES   LUMBAR FUSION  07/2011   L4- S1   LUMBAR FUSION  10-04-2017   dr Carloyn Manner in [place], [place]   L2 -- L4 to previous fusion L4-S1   LUMBAR LAMINECTOMY  08-03-2006  dr Carloyn Manner @[facility]    L4-5   PERCUTANEOUS NEPHROSTOLITHOTOMY  2000 approx   ROTATOR CUFF REPAIR Bilateral right 2015;  left ?   TOTAL KNEE ARTHROPLASTY Right 02/26/2015   Procedure:  TOTAL KNEE ARTHROPLASTY;  Surgeon: Marchia Bond, MD;  Location: [facility];  Service: Orthopedics;  Laterality: Right;   VIDEO ASSISTED THORACOSCOPY (VATS)/DECORTICATION Left 06-29-2007  dr Arlyce Dice @[facility]    mini thoractomy and drainage left-sided empyema    Current Outpatient Medications  Medication Sig Dispense Refill   albuterol (PROVENTIL HFA;VENTOLIN HFA) 108 (90 Base) MCG/ACT inhaler Inhale 2 puffs into the lungs every 6 (six) hours as needed for wheezing or shortness of breath. 1 Inhaler 2   amLODipine (NORVASC) 10 MG tablet Take 1 tablet  (10 mg total) by mouth daily. (Patient taking differently: Take 10 mg by mouth daily. ) 30 tablet 5   aspirin 81 MG tablet Take 81 mg by mouth daily.     HYDROcodone-acetaminophen (NORCO/VICODIN) 5-325 MG tablet One tablet every 4-6 hours as needed for pain 20 tablet 0   insulin glargine (LANTUS) 100 UNIT/ML injection Inject 0.25 mLs (25 Units total) into the skin at bedtime. (Patient taking differently: Inject 40 Units into the skin at bedtime. ) 10 mL 5   Krill Oil (OMEGA-3) 500 MG CAPS Take 1 capsule by mouth 2 (two) times daily.     losartan (COZAAR) 100 MG tablet Take 1 tablet (100 mg total) by mouth daily. 30 tablet 5   metFORMIN (GLUCOPHAGE) 1000 MG tablet Take 1 tablet (1,000 mg total) by mouth 2 (two) times daily with a meal. (Patient taking differently: Take 1,000 mg by mouth 2 (two) times daily with a meal. ) 60 tablet 5   Multiple Vitamins-Minerals (MULTIVITAMINS THER. W/MINERALS) TABS Take 1 tablet by mouth daily.     phenazopyridine (PYRIDIUM) 200 MG tablet Take 1 tablet (200 mg total) by mouth 3 (three) times daily as needed (for pain with urination). 30 tablet 0   prazosin (MINIPRESS) 1 MG capsule Take 1 mg by mouth at bedtime.      simvastatin (ZOCOR) 80 MG tablet Take 40 mg by mouth at bedtime.     tamsulosin (FLOMAX) 0.4 MG CAPS capsule Take 1 capsule (0.4 mg total) by mouth daily for 14 days. (Patient taking differently: Take 0.4 mg by mouth daily after supper. ) 14 capsule 0   traZODone (DESYREL) 100 MG tablet Take 100 mg by mouth at bedtime.     No current facility-administered medications for this visit.     Allergies as of 09/15/2018   (No Known Allergies)    Family History  Problem Relation Age of Onset   Heart disease Mother        Reportedly diagnosed in her 20s   Diabetes Mother    Dementia Father     Social History   Socioeconomic History   Marital status: Married    Spouse name: Not on file   Number of children: 2   Years of  education: Not on file   Highest education level: Not on file  Occupational History   Occupation: retired  Scientist, product/process development strain: Not on file   Food insecurity    Worry: Not on file    Inability: Not on file   Transportation needs    Medical: Not on file    Non-medical: Not on file  Tobacco Use   Smoking status: Former Smoker    Years: 20.00    Types: Cigars    Start date: 01/05/1962    Quit date: 01/06/2007    Years since quitting: 11.6   Smokeless tobacco: Never Used  Substance and Sexual Activity   Alcohol use: Not Currently  Alcohol/week: 0.0 standard drinks    Comment: BEER-OCC.   Drug use: Never   Sexual activity: Not on file  Lifestyle   Physical activity    Days per week: Not on file    Minutes per session: Not on file   Stress: Not on file  Relationships   Social connections    Talks on phone: Not on file    Gets together: Not on file    Attends religious service: Not on file    Active member of club or organization: Not on file    Attends meetings of clubs or organizations: Not on file    Relationship status: Not on file   Intimate partner violence    Fear of current or ex partner: Not on file    Emotionally abused: Not on file    Physically abused: Not on file    Forced sexual activity: Not on file  Other Topics Concern   Not on file  Social History Narrative   Not on file    Review of Systems:    Constitutional: No weight loss, fever or chills Skin: No rash  Cardiovascular: No chest pain  Respiratory: No SOB  Gastrointestinal: See HPI and otherwise negative Genitourinary: No dysuria  Neurological: No headache, dizziness or syncope Musculoskeletal: No new muscle or joint pain Hematologic: No bleeding Psychiatric: No history of depression or anxiety   Physical Exam:  Vital signs: BP 130/60    Pulse 85    Temp 97.8 F (36.6 C)    Ht 5\' 11"  (1.803 m)    Wt 235 lb (106.6 kg)    BMI 32.78 kg/m   Constitutional:    Pleasant Caucasian male appears to be in NAD, Well developed, Well nourished, alert and cooperative Head:  Normocephalic and atraumatic. Eyes:   PEERL, EOMI. No icterus. Conjunctiva pink. Ears:  Normal auditory acuity. Neck:  Supple Throat: Oral cavity and pharynx without inflammation, swelling or lesion.  Respiratory: Respirations even and unlabored. Lungs clear to auscultation bilaterally.   No wheezes, crackles, or rhonchi.  Cardiovascular: Normal S1, S2. No MRG. Regular rate and rhythm. No peripheral edema, cyanosis or pallor.  Gastrointestinal:  Soft, nondistended, nontender. No rebound or guarding. Normal bowel sounds. No appreciable masses or hepatomegaly. Rectal:  Not performed.  Msk:  Symmetrical without gross deformities. Without edema, no deformity or joint abnormality.  Neurologic:  Alert and  oriented x4;  grossly normal neurologically.  Skin:   Dry and intact without significant lesions or rashes. Psychiatric: Demonstrates good judgement and reason without abnormal affect or behaviors.  See HPI for recent imaging.  Assessment: 1.  Abnormal CT: CT was a renal stone study done 09/02/2018 with findings of nodules in the omentum with concern for possible colon cancer 2.  Asymptomatic Cholelithiasis: Seen on recent CT, no gallbladder wall thickening, no pain  Plan: 1.  Scheduled patient for colonoscopy with Dr. Havery [name] in the [facility].  Did discuss risks, benefits, limitations and alternatives and the patient agrees to proceed. 2.  Discussed cholelithiasis.  There is no wall thickening and patient has no symptoms.  Hopefully this will never give him any trouble. 3.  Patient to follow in clinic per recommendations from Dr. Havery [name] after time of procedure.  Ellouise Newer, PA-C [facility] 09/15/2018, 9:09 AM  Cc: Mikey Kirschner, MD

## 2018-09-15 NOTE — Patient Instructions (Signed)
You have been scheduled for a colonoscopy. Please follow written instructions given to you at your visit today.  Please pick up your prep supplies at the pharmacy within the next 1-3 days. If you use inhalers (even only as needed), please bring them with you on the day of your procedure.   

## 2018-09-15 NOTE — Progress Notes (Signed)
Agree with assessment and plan. Last colonoscopy looked okay without any high risk lesions. I think interval colon cancer development is unlikely given that result, however with CT findings will proceed to ensure okay. Further recommendations pending the results.

## 2018-09-20 ENCOUNTER — Telehealth: Payer: Self-pay

## 2018-09-20 DIAGNOSIS — N471 Phimosis: Secondary | ICD-10-CM | POA: Diagnosis not present

## 2018-09-20 NOTE — Telephone Encounter (Signed)
Covid-19 screening questions   Do you now or have you had a fever in the last 14 days? NO   Do you have any respiratory symptoms of shortness of breath or cough now or in the last 14 days? NO  Do you have any family members or close contacts with diagnosed or suspected Covid-19 in the past 14 days? NO  Have you been tested for Covid-19 and found to be positive? NO        

## 2018-09-21 ENCOUNTER — Other Ambulatory Visit: Payer: Self-pay

## 2018-09-21 ENCOUNTER — Encounter: Payer: Self-pay | Admitting: Gastroenterology

## 2018-09-21 ENCOUNTER — Telehealth: Payer: Self-pay | Admitting: Gastroenterology

## 2018-09-21 ENCOUNTER — Ambulatory Visit (AMBULATORY_SURGERY_CENTER): Payer: Medicare Other | Admitting: Gastroenterology

## 2018-09-21 VITALS — BP 151/66 | HR 58 | Temp 98.1°F | Resp 22 | Ht 71.0 in | Wt 235.0 lb

## 2018-09-21 DIAGNOSIS — D120 Benign neoplasm of cecum: Secondary | ICD-10-CM

## 2018-09-21 DIAGNOSIS — K648 Other hemorrhoids: Secondary | ICD-10-CM

## 2018-09-21 DIAGNOSIS — K5730 Diverticulosis of large intestine without perforation or abscess without bleeding: Secondary | ICD-10-CM

## 2018-09-21 DIAGNOSIS — R935 Abnormal findings on diagnostic imaging of other abdominal regions, including retroperitoneum: Secondary | ICD-10-CM

## 2018-09-21 DIAGNOSIS — D122 Benign neoplasm of ascending colon: Secondary | ICD-10-CM

## 2018-09-21 DIAGNOSIS — R933 Abnormal findings on diagnostic imaging of other parts of digestive tract: Secondary | ICD-10-CM | POA: Diagnosis not present

## 2018-09-21 DIAGNOSIS — E119 Type 2 diabetes mellitus without complications: Secondary | ICD-10-CM | POA: Diagnosis not present

## 2018-09-21 DIAGNOSIS — I10 Essential (primary) hypertension: Secondary | ICD-10-CM | POA: Diagnosis not present

## 2018-09-21 MED ORDER — SODIUM CHLORIDE 0.9 % IV SOLN: 500.0000 mL | Freq: Once | INTRAVENOUS | Status: DC

## 2018-09-21 NOTE — Telephone Encounter (Signed)
Referral made to IR for review of patient's CT (to see if lesion is amenable to a biospy) called patient and relayed Dr. Doyne Keel result note  from the CT and that someone from IR might be calling him to schedule a biopsy, if they feel it is needed.

## 2018-09-21 NOTE — Op Note (Signed)
[facility] Patient Name: Logan Hooper Procedure Date: 09/21/2018 8:18 AM MRN: [id number] Endoscopist: Remo Lipps P. Havery [name] , MD Age: 73 Referring MD:  Date of Birth: Nov 01, 1945 Gender: Male Account #: [id number] Procedure:                Colonoscopy Indications:              Abnormal CT of the GI tract, small nodularity along                            anterior left abdominal wall - suspected omental                            lesions, rule out primary colon neoplasm Medicines:                Monitored Anesthesia Care Procedure:                Pre-Anesthesia Assessment:                           - Prior to the procedure, a History and Physical                            was performed, and patient medications and                            allergies were reviewed. The patient's tolerance of                            previous anesthesia was also reviewed. The risks                            and benefits of the procedure and the sedation                            options and risks were discussed with the patient.                            All questions were answered, and informed consent                            was obtained. Prior Anticoagulants: The patient has                            taken no previous anticoagulant or antiplatelet                            agents. ASA Grade Assessment: II - A patient with                            mild systemic disease. After reviewing the risks                            and benefits, the patient was deemed in  satisfactory condition to undergo the procedure.                           After obtaining informed consent, the colonoscope                            was passed under direct vision. Throughout the                            procedure, the patient's blood pressure, pulse, and                            oxygen saturations were monitored continuously. The                            Colonoscope was  introduced through the anus and                            advanced to the the terminal ileum, with                            identification of the appendiceal orifice and IC                            valve. The colonoscopy was performed without                            difficulty. The patient tolerated the procedure                            well. The quality of the bowel preparation was                            good. The terminal ileum, ileocecal valve,                            appendiceal orifice, and rectum were photographed. Scope In: 8:23:19 AM Scope Out: 8:43:15 AM Scope Withdrawal Time: 0 hours 18 minutes 4 seconds  Total Procedure Duration: 0 hours 19 minutes 56 seconds  Findings:                 The perianal and digital rectal examinations were                            normal.                           The terminal ileum appeared normal.                           Multiple medium-mouthed diverticula were found in                            the entire colon.  A 3 mm polyp was found in the cecum. The polyp was                            sessile. The polyp was removed with a cold snare.                            Resection and retrieval were complete.                           A diminutive polyp was found in the ascending                            colon. The polyp was sessile. The polyp was removed                            with a cold snare. Resection and retrieval were                            complete.                           Internal hemorrhoids were found during                            retroflexion. The hemorrhoids were small.                           The exam was otherwise without abnormality. Complications:            No immediate complications. Estimated blood loss:                            Minimal. Estimated Blood Loss:     Estimated blood loss was minimal. Impression:               - The examined portion of the ileum was  normal.                           - Diverticulosis in the entire examined colon.                           - One 3 mm polyp in the cecum, removed with a cold                            snare. Resected and retrieved.                           - One diminutive polyp in the ascending colon,                            removed with a cold snare. Resected and retrieved.                           - Internal hemorrhoids.                           -  The examination was otherwise normal.                           No cause for CT scan findings on colonoscopy. Recommendation:           - Patient has a contact number available for                            emergencies. The signs and symptoms of potential                            delayed complications were discussed with the                            patient. Return to normal activities tomorrow.                            Written discharge instructions were provided to the                            patient.                           - Resume previous diet.                           - Continue present medications.                           - Await pathology results.                           - Will discuss CT findings with radiology,                            determine if omental lesions are amenable to IR                            biopsy Remo Lipps P. Regena Delucchi, MD 09/21/2018 8:52:26 AM This report has been signed electronically.

## 2018-09-21 NOTE — Telephone Encounter (Signed)
Patient had a colonoscopy today for abnormal CT scan findings. No colon cancer or anything concerning in regards to his CT scan result. I called radiology and reviewed the CT with them - unclear what the omental lesions represent, could be benign fat necrosis, while omental mets are on the ddx but seem less likely. Unclear - option is to biopsy this or have follow up imaging to assess stability. Radiology recommended IR consultation to discuss this.  Sherlynn Stalls can you let the patient know I reviewed his scan with radiology, would like him to see IR to determine if this is amenable to biopsy and if it should be done or not. Can you please place a consult. Thanks

## 2018-09-21 NOTE — Progress Notes (Signed)
Kidney Stone blasted last Wednesday.

## 2018-09-21 NOTE — Progress Notes (Signed)
Called to room to assist during endoscopic procedure.  Patient ID and intended procedure confirmed with present staff. Received instructions for my participation in the procedure from the performing physician.  

## 2018-09-21 NOTE — Patient Instructions (Signed)
Please read handouts provided. Continue present medications. Await pathology results.        YOU HAD AN ENDOSCOPIC PROCEDURE TODAY AT THE [facility]:   Refer to the procedure report that was given to you for any specific questions about what was found during the examination.  If the procedure report does not answer your questions, please call your gastroenterologist to clarify.  If you requested that your care partner not be given the details of your procedure findings, then the procedure report has been included in a sealed envelope for you to review at your convenience later.  YOU SHOULD EXPECT: Some feelings of bloating in the abdomen. Passage of more gas than usual.  Walking can help get rid of the air that was put into your GI tract during the procedure and reduce the bloating. If you had a lower endoscopy (such as a colonoscopy or flexible sigmoidoscopy) you may notice spotting of blood in your stool or on the toilet paper. If you underwent a bowel prep for your procedure, you may not have a normal bowel movement for a few days.  Please Note:  You might notice some irritation and congestion in your nose or some drainage.  This is from the oxygen used during your procedure.  There is no need for concern and it should clear up in a day or so.  SYMPTOMS TO REPORT IMMEDIATELY:   Following lower endoscopy (colonoscopy or flexible sigmoidoscopy):  Excessive amounts of blood in the stool  Significant tenderness or worsening of abdominal pains  Swelling of the abdomen that is new, acute  Fever of 100F or higher    For urgent or emergent issues, a gastroenterologist can be reached at any hour by calling [phone].   DIET:  We do recommend a small meal at first, but then you may proceed to your regular diet.  Drink plenty of fluids but you should avoid alcoholic beverages for 24 hours.  ACTIVITY:  You should plan to take it easy for the rest of today and you should NOT  DRIVE or use heavy machinery until tomorrow (because of the sedation medicines used during the test).    FOLLOW UP: Our staff will call the number listed on your records 48-72 hours following your procedure to check on you and address any questions or concerns that you may have regarding the information given to you following your procedure. If we do not reach you, we will leave a message.  We will attempt to reach you two times.  During this call, we will ask if you have developed any symptoms of COVID 19. If you develop any symptoms (ie: fever, flu-like symptoms, shortness of breath, cough etc.) before then, please call [phone].  If you test positive for Covid 19 in the 2 weeks post procedure, please call and report this information to us.    If any biopsies were taken you will be contacted by phone or by letter within the next 1-3 weeks.  Please call us at [phone] if you have not heard about the biopsies in 3 weeks.    SIGNATURES/CONFIDENTIALITY: You and/or your care partner have signed paperwork which will be entered into your electronic medical record.  These signatures attest to the fact that that the information above on your After Visit Summary has been reviewed and is understood.  Full responsibility of the confidentiality of this discharge information lies with you and/or your care-partner. 

## 2018-09-21 NOTE — Progress Notes (Signed)
Report given to PACU, vss 

## 2018-09-23 ENCOUNTER — Telehealth: Payer: Self-pay | Admitting: *Deleted

## 2018-09-23 ENCOUNTER — Telehealth: Payer: Self-pay

## 2018-09-23 NOTE — Telephone Encounter (Signed)
  Follow up Call-  Call back number 09/21/2018  Post procedure Call Back phone  # [phone]  Permission to leave phone message Yes  Some recent data might be hidden   voicemail not set up; unable to leave message

## 2018-09-23 NOTE — Telephone Encounter (Signed)
First post procedure follow up call, no answer 

## 2018-09-28 ENCOUNTER — Encounter: Payer: Self-pay | Admitting: Gastroenterology

## 2018-10-17 DIAGNOSIS — N201 Calculus of ureter: Secondary | ICD-10-CM | POA: Diagnosis not present

## 2018-10-17 DIAGNOSIS — N130 Hydronephrosis with ureteropelvic junction obstruction: Secondary | ICD-10-CM | POA: Diagnosis not present

## 2018-10-17 DIAGNOSIS — N200 Calculus of kidney: Secondary | ICD-10-CM | POA: Diagnosis not present

## 2018-10-17 DIAGNOSIS — N471 Phimosis: Secondary | ICD-10-CM | POA: Diagnosis not present

## 2018-10-17 DIAGNOSIS — R1084 Generalized abdominal pain: Secondary | ICD-10-CM | POA: Diagnosis not present

## 2018-10-31 DIAGNOSIS — E1140 Type 2 diabetes mellitus with diabetic neuropathy, unspecified: Secondary | ICD-10-CM | POA: Diagnosis not present

## 2018-10-31 DIAGNOSIS — M79674 Pain in right toe(s): Secondary | ICD-10-CM | POA: Diagnosis not present

## 2018-10-31 DIAGNOSIS — M79675 Pain in left toe(s): Secondary | ICD-10-CM | POA: Diagnosis not present

## 2018-10-31 DIAGNOSIS — B351 Tinea unguium: Secondary | ICD-10-CM | POA: Diagnosis not present

## 2018-11-30 ENCOUNTER — Other Ambulatory Visit: Payer: Self-pay

## 2019-01-13 DIAGNOSIS — S32009K Unspecified fracture of unspecified lumbar vertebra, subsequent encounter for fracture with nonunion: Secondary | ICD-10-CM | POA: Diagnosis not present

## 2019-01-13 DIAGNOSIS — I10 Essential (primary) hypertension: Secondary | ICD-10-CM | POA: Diagnosis not present

## 2019-02-09 ENCOUNTER — Encounter: Payer: Self-pay | Admitting: Family Medicine

## 2019-02-21 ENCOUNTER — Ambulatory Visit (INDEPENDENT_AMBULATORY_CARE_PROVIDER_SITE_OTHER): Payer: Medicare Other | Admitting: Family Medicine

## 2019-02-21 ENCOUNTER — Other Ambulatory Visit: Payer: Self-pay

## 2019-02-21 DIAGNOSIS — B029 Zoster without complications: Secondary | ICD-10-CM | POA: Diagnosis not present

## 2019-02-21 MED ORDER — VALACYCLOVIR HCL 1 G PO TABS: 1000.0000 mg | ORAL_TABLET | Freq: Three times a day (TID) | ORAL | 0 refills | Status: AC

## 2019-02-21 NOTE — Progress Notes (Signed)
   Subjective:  Audio plus video  Patient ID: Logan Hooper, male    DOB: 07-04-1945, 74 y.o.   MRN: [id number]  Rash This is a new problem. Episode onset: today. Location: neck, and chest. The rash is characterized by itchiness. Treatments tried: itch cream.   Virtual Visit via Video Note  I connected with Stevenson Clinch on 02/21/19 at  1:10 PM EST by a video enabled telemedicine application and verified that I am speaking with the correct person using two identifiers.  Location: Patient: home Provider: office   I discussed the limitations of evaluation and management by telemedicine and the availability of in person appointments. The patient expressed understanding and agreed to proceed.  History of Present Illness:    Observations/Objective:   Assessment and Plan:   Follow Up Instructions:    I discussed the assessment and treatment plan with the patient. The patient was provided an opportunity to ask questions and all were answered. The patient agreed with the plan and demonstrated an understanding of the instructions.   The patient was advised to call back or seek an in-person evaluation if the symptoms worsen or if the condition fails to improve as anticipated.  I provided 22 minutes of non-face-to-face time during this encounter.   Duration of rash 2 days.  Main symptom is itchiness.  Also having some pain.  Did give 1 shingles shot remotely.   No fever no cough no congestion Review of Systems  Skin: Positive for rash.       Objective:   Physical Exam   Virtual rash appearance classic for shingles     Assessment & Plan:  Impression shingles discussed at length.  Etiology.  Management.  Warning signs.  Risk for postherpetic neuropathy.  Symptom care.  Valtrex 1 g 3 times daily 7 days

## 2019-02-23 MED ORDER — HYDROCODONE-ACETAMINOPHEN 5-325 MG PO TABS: ORAL_TABLET | ORAL | 0 refills | Status: AC

## 2019-03-30 DIAGNOSIS — R350 Frequency of micturition: Secondary | ICD-10-CM | POA: Diagnosis not present

## 2019-03-30 DIAGNOSIS — N201 Calculus of ureter: Secondary | ICD-10-CM | POA: Diagnosis not present

## 2020-02-09 DIAGNOSIS — E785 Hyperlipidemia, unspecified: Secondary | ICD-10-CM | POA: Diagnosis not present

## 2020-02-09 DIAGNOSIS — E538 Deficiency of other specified B group vitamins: Secondary | ICD-10-CM | POA: Diagnosis not present

## 2020-02-09 DIAGNOSIS — E1121 Type 2 diabetes mellitus with diabetic nephropathy: Secondary | ICD-10-CM | POA: Diagnosis not present

## 2020-02-09 DIAGNOSIS — G629 Polyneuropathy, unspecified: Secondary | ICD-10-CM | POA: Diagnosis not present

## 2020-02-09 DIAGNOSIS — R2681 Unsteadiness on feet: Secondary | ICD-10-CM | POA: Diagnosis not present

## 2020-02-09 DIAGNOSIS — I10 Essential (primary) hypertension: Secondary | ICD-10-CM | POA: Diagnosis not present

## 2020-02-09 DIAGNOSIS — G3184 Mild cognitive impairment, so stated: Secondary | ICD-10-CM | POA: Diagnosis not present

## 2020-02-09 DIAGNOSIS — W19XXXA Unspecified fall, initial encounter: Secondary | ICD-10-CM | POA: Diagnosis not present

## 2020-02-09 DIAGNOSIS — E1169 Type 2 diabetes mellitus with other specified complication: Secondary | ICD-10-CM | POA: Diagnosis not present

## 2020-02-22 DIAGNOSIS — E1169 Type 2 diabetes mellitus with other specified complication: Secondary | ICD-10-CM | POA: Diagnosis not present

## 2020-02-22 DIAGNOSIS — I10 Essential (primary) hypertension: Secondary | ICD-10-CM | POA: Diagnosis not present

## 2020-03-29 DIAGNOSIS — G3184 Mild cognitive impairment, so stated: Secondary | ICD-10-CM | POA: Diagnosis not present

## 2020-03-29 DIAGNOSIS — N201 Calculus of ureter: Secondary | ICD-10-CM | POA: Diagnosis not present

## 2020-03-29 DIAGNOSIS — E1121 Type 2 diabetes mellitus with diabetic nephropathy: Secondary | ICD-10-CM | POA: Diagnosis not present

## 2020-03-29 DIAGNOSIS — I10 Essential (primary) hypertension: Secondary | ICD-10-CM | POA: Diagnosis not present

## 2020-03-29 DIAGNOSIS — Z13828 Encounter for screening for other musculoskeletal disorder: Secondary | ICD-10-CM | POA: Diagnosis not present

## 2020-03-29 DIAGNOSIS — E538 Deficiency of other specified B group vitamins: Secondary | ICD-10-CM | POA: Diagnosis not present

## 2020-03-29 DIAGNOSIS — E1169 Type 2 diabetes mellitus with other specified complication: Secondary | ICD-10-CM | POA: Diagnosis not present

## 2020-03-29 DIAGNOSIS — Z794 Long term (current) use of insulin: Secondary | ICD-10-CM | POA: Diagnosis not present

## 2020-03-29 DIAGNOSIS — R3589 Other polyuria: Secondary | ICD-10-CM | POA: Diagnosis not present

## 2020-03-29 DIAGNOSIS — R3915 Urgency of urination: Secondary | ICD-10-CM | POA: Diagnosis not present

## 2020-03-29 DIAGNOSIS — G629 Polyneuropathy, unspecified: Secondary | ICD-10-CM | POA: Diagnosis not present

## 2020-08-06 ENCOUNTER — Encounter (HOSPITAL_COMMUNITY): Payer: Self-pay

## 2020-08-06 ENCOUNTER — Other Ambulatory Visit: Payer: Self-pay

## 2020-08-06 ENCOUNTER — Ambulatory Visit (HOSPITAL_COMMUNITY): Payer: Medicare Other | Attending: Internal Medicine

## 2020-08-06 DIAGNOSIS — R2681 Unsteadiness on feet: Secondary | ICD-10-CM | POA: Insufficient documentation

## 2020-08-06 DIAGNOSIS — M6281 Muscle weakness (generalized): Secondary | ICD-10-CM | POA: Insufficient documentation

## 2020-08-06 DIAGNOSIS — R262 Difficulty in walking, not elsewhere classified: Secondary | ICD-10-CM | POA: Diagnosis present

## 2020-08-06 NOTE — Therapy (Signed)
[place], [place], [address] Phone: [phone]   Fax:  [phone]  Physical Therapy Evaluation  Patient Details  Name: Logan Hooper MRN: [id number] Date of Birth: Apr 26, 1945 Referring Provider (PT): Sueanne Margarita, DO   Encounter Date: 08/06/2020   PT End of Session - 08/06/20 0836     Visit Number 1    Number of Visits 12    Date for PT Re-Evaluation 09/17/20    Authorization Type Medicare primary; [facility] MCR supplement secondary    Progress Note Due on Visit 10    PT Start Time 0845    PT Stop Time 0935    PT Time Calculation (min) 50 min    Activity Tolerance Patient tolerated treatment well    Behavior During Therapy [facility] for tasks assessed/performed             Past Medical History:  Diagnosis Date   Diabetic neuropathy [facility])    ED (erectile dysfunction)    Essential hypertension    Full dentures    Function kidney decreased    right side per pt   History of kidney stones    History of nuclear stress test    01-23-2014 (in epic) low risk w/ no ischemia, normal LV function and wall motion , ef 55%   History of pleural empyema    s/p  VATS w/ drainage 2009   Hyperlipidemia    Nephrolithiasis    per CT non-obstructive stone left side   Neurogenic claudication due to lumbar spinal stenosis    per pt occasional   OA (osteoarthritis)    Phimosis    PTSD (post-traumatic stress disorder)    Right ureteral stone    Type 2 diabetes mellitus treated with insulin ([place])    followed by pcp   Wears glasses    Wears hearing aid in both ears     Past Surgical History:  Procedure Laterality Date   CIRCUMCISION N/A 09/07/2018   Procedure: CIRCUMCISION ADULT;  Surgeon: Ceasar Mons, MD;  Location: [facility];  Service: Urology;  Laterality: N/A;   COLONOSCOPY  last one 10-23-2014   CYSTOSCOPY/RETROGRADE/URETEROSCOPY  2000 approx.   CYSTOSCOPY/URETEROSCOPY/HOLMIUM LASER/STENT  PLACEMENT Right 09/07/2018   Procedure: CYSTOSCOPY/RETROGRADE/URETEROSCOPY/HOLMIUM LASER/STENT PLACEMENT;  Surgeon: Ceasar Mons, MD;  [facility];  Service: Urology;  Laterality: Right;  ONLY NEEDS 90 MIN FOR ALL PROCEDURES   LUMBAR FUSION  07/2011   L4- S1   LUMBAR FUSION  10-04-2017   dr Carloyn Manner in [place], [place]   L2 -- L4 to previous fusion L4-S1   LUMBAR LAMINECTOMY  08-03-2006  dr Carloyn Manner [facility]    L4-5   PERCUTANEOUS NEPHROSTOLITHOTOMY  2000 approx   ROTATOR CUFF REPAIR Bilateral right 2015;  left ?   TOTAL KNEE ARTHROPLASTY Right 02/26/2015   Procedure: TOTAL KNEE ARTHROPLASTY;  Surgeon: Marchia Bond, MD;  Location: [facility];  Service: Orthopedics;  Laterality: Right;   VIDEO ASSISTED THORACOSCOPY (VATS)/DECORTICATION Left 06-29-2007  dr Arlyce Dice [facility]    mini thoractomy and drainage left-sided empyema    There were no vitals filed for this visit.    Subjective Assessment - 08/06/20 0856     Subjective Pt reports his balance has been off and notes 3-4 falls in the past 6 months. Of note pt has had 3 back surgeries, right TKR, and severe left knee OA and reports constant pain in low back and hips. Pt does not use AD for ambulation.  Pt endorses diabetic neuropathy in bilateal feet.    Limitations Lifting;Standing;Walking    How long can you sit comfortably? 10-15 min    How long can you stand comfortably? 10-15 min    How long can you walk comfortably? 10-15 min    Currently in Pain? Yes    Pain Score 6     Pain Location Generalized    Pain Descriptors / Indicators Aching;Burning    Pain Type Chronic pain    Aggravating Factors  standing, walking    Pain Relieving Factors sitting, flexion (walking with shopping cart)                [facility] PT Assessment - 08/06/20 0001       Assessment   Medical Diagnosis Unsteadiness on feet    Referring Provider (PT) Sueanne Margarita, DO      Balance Screen   Has the patient fallen in the past 6 months Yes    How many  times? 3-4    Has the patient had a decrease in activity level because of a fear of falling?  Yes    Is the patient reluctant to leave their home because of a fear of falling?  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 Coordinated Yes      Posture/Postural Control   Posture/Postural Control Postural limitations    Postural Limitations Flexed trunk      ROM / Strength   AROM / PROM / Strength Strength;AROM      AROM   AROM Assessment Site Lumbar    Lumbar Flexion 25% limited    Lumbar Extension 50% limited      Strength   Strength Assessment Site Knee;Ankle;Hip    Right/Left Hip Right;Left    Right Hip Flexion 3+/5    Right Hip Extension 2+/5    Right Hip ABduction 3/5    Left Hip Flexion 3+/5    Left Hip Extension 2+/5    Left Hip ABduction 3/5    Right/Left Knee Right;Left    Right Knee Flexion 4/5    Right Knee Extension 4/5    Left Knee Flexion 4/5    Left Knee Extension 4/5    Right/Left Ankle Right;Left    Right Ankle Dorsiflexion 4/5    Right Ankle Plantar Flexion 4/5    Left Ankle Dorsiflexion 4/5    Left Ankle Plantar Flexion 4/5      Palpation   Spinal mobility multiple incisions from back surgeries. Erector/QL trigger points    Palpation comment lumabr trigger points      Transfers   Five time sit to stand comments  23      Ambulation/Gait   Ambulation/Gait Yes    Ambulation/Gait Assistance 7: Independent    Ambulation Distance (Feet) 226 Feet    Assistive device None    Gait Pattern Step-through pattern;Trunk flexed;Decreased stride length    Ambulation  Surface Level;Indoor    Gait velocity decreased  Stairs Yes    Stairs Assistance 6: Modified independent (Device/Increase time)    Stair Management Technique Two rails;Alternating pattern    Number of Stairs 8    Height of Stairs 6    Gait Comments 2MWT      Balance   Balance Assessed Yes      High Level Balance   High Level Balance Comments SLS: 3 sec left, 4 sec right                        Objective measurements completed on examination: See above findings.       [facility] Adult PT Treatment/Exercise - 08/06/20 0001       Exercises   Exercises Lumbar      Lumbar Exercises: Supine   Ab Set 20 reps;2 seconds                    PT Education - 08/06/20 1106     Education Details education in assessment findings and rationale/benefits of therapy and exercise in general. Walking with walking stick/trekking poles to promote balance and trunk flexion to enable greater walking tolerance    Person(s) Educated Patient    Methods Explanation    Comprehension Verbalized understanding              PT Short Term Goals - 08/06/20 1259       PT SHORT TERM GOAL #1   Title Patient will be independent with HEP in order to improve functional outcomes.    Time 3    Period Weeks    Status New    Target Date 08/27/20      PT SHORT TERM GOAL #2   Title Pt single leg stance to be at least 10 seconds to reduce risk of falling     Baseline 3-5 sec left/right    Time 3    Period Weeks    Status New    Target Date 08/27/20      PT SHORT TERM GOAL #3   Title Demo improved BLE strength and balance as evidenced by 5xSTS test of 15 sec    Baseline 23 sec with BUE push-off    Time 3    Period Weeks    Status New    Target Date 08/27/20               PT Long Term Goals - 08/06/20 1301       PT LONG TERM GOAL #1   Title Patient will report at least 25% improvement in symptoms for improved quality of life.    Time 6    Period Weeks    Status  New    Target Date 09/17/20      PT LONG TERM GOAL #2   Title Patient will be able to complete 5x STS in under 11.4 seconds in order to reduce the risk of falls.    Baseline 23 sec    Time 6    Period Weeks    Status New    Target Date 09/17/20      PT LONG TERM GOAL #3   Title Demo improved gait velocity as evidenced by distance of 300 ft during 2MWT    Baseline 226 ft    Time 6    Period Weeks    Status New    Target Date 09/17/20  Plan - 08/06/20 1212     Clinical Impression Statement Pt is 75 yo male with chronic LBP and generalized weakness presenting to therapy with activity tolerance deficits, dynamic balance deficits, reduced ability to safely ambulate, high risk for falls, and general level of deconditioning. Patient would benefit from PT services to train/instruct in HEP, compensatory/adaptive strategies to improve ambulation/balance to reduce risk for falls, and improve general strength and activity tolerance for improved quality of life.    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/Moderate complexity    Clinical Decision Making Moderate    Rehab Potential Fair   pt reports less than optimal outcomes with past therapy and reports non-compliance with HEP and physical activity   PT Frequency 2x / week    PT Duration 6 weeks    PT Treatment/Interventions ADLs/Self Care Home Management;Aquatic Therapy;Gait training;Stair training;Functional mobility training;Therapeutic activities;Therapeutic exercise;Balance training;Patient/family education;Neuromuscular re-education;Manual techniques;Dry needling;Taping;Spinal Manipulations;Joint Manipulations    PT Next Visit Plan soft tissue mobilization to  lumbar spine trigger points, core strength, BLE strength    PT Home Exercise Plan ab set, walking with walking stick/trekking poles    Consulted and Agree with Plan of Care Patient             Patient will benefit from skilled therapeutic intervention in order to improve the following deficits and impairments:  Abnormal gait, Decreased activity tolerance, Decreased balance, Decreased endurance, Decreased strength, Decreased range of motion, Difficulty walking, Postural dysfunction, Improper body mechanics, Pain  Visit Diagnosis: Difficulty in walking, not elsewhere classified  Muscle weakness (generalized)  Unsteadiness on feet     Problem List Patient Active Problem List   Diagnosis Date Noted   Primary localized osteoarthritis of right knee 02/26/2015   Fecal incontinence 09/24/2014   Precordial pain 01/18/2014   Adhesive capsulitis of shoulder 06/15/2013   Decreased range of motion of right shoulder 05/19/2013   Muscle weakness (generalized) 05/19/2013   Diabetic autonomic neuropathy ([place]) 05/20/2012   Other and unspecified hyperlipidemia 05/20/2012   Insomnia 05/20/2012   Essential hypertension, benign 05/20/2012   1:04 PM, 08/06/20 M. Sherlyn Lees, PT, DPT Physical Therapist- [place] Office Number: [phone]   [facility] [address] [place], [place], [address] Phone: [phone]   Fax:  [phone]  Name: Logan Hooper MRN: [id number] Date of Birth: 1945/01/29

## 2020-08-13 ENCOUNTER — Other Ambulatory Visit: Payer: Self-pay

## 2020-08-13 ENCOUNTER — Encounter (HOSPITAL_COMMUNITY): Payer: Self-pay | Admitting: Physical Therapy

## 2020-08-13 ENCOUNTER — Ambulatory Visit (HOSPITAL_COMMUNITY): Payer: Medicare Other | Admitting: Physical Therapy

## 2020-08-13 DIAGNOSIS — R2681 Unsteadiness on feet: Secondary | ICD-10-CM

## 2020-08-13 DIAGNOSIS — M6281 Muscle weakness (generalized): Secondary | ICD-10-CM

## 2020-08-13 DIAGNOSIS — R262 Difficulty in walking, not elsewhere classified: Secondary | ICD-10-CM | POA: Diagnosis not present

## 2020-08-13 NOTE — Therapy (Signed)
[facility] [address] [place], [place], [address] Phone: [phone]   Fax:  [phone]  Physical Therapy Treatment  Patient Details  Name: Logan Hooper MRN: [id number] Date of Birth: [birth year] Referring Provider (PT): Sueanne Margarita, DO   Encounter Date: 08/13/2020   PT End of Session - 08/13/20 1440     Visit Number 2    Number of Visits 12    Date for PT Re-Evaluation 09/17/20    Authorization Type Medicare primary; [facility] MCR supplement secondary    Progress Note Due on Visit 10    PT Start Time 1431    PT Stop Time 1512    PT Time Calculation (min) 41 min    Activity Tolerance Patient tolerated treatment well    Behavior During Therapy [facility] for tasks assessed/performed             Past Medical History:  Diagnosis Date   Diabetic neuropathy ([place])    ED (erectile dysfunction)    Essential hypertension    Full dentures    Function kidney decreased    right side per pt   History of kidney stones    History of nuclear stress test    01-23-2014 (in epic) low risk w/ no ischemia, normal LV function and wall motion , ef 55%   History of pleural empyema    s/p  VATS w/ drainage 2009   Hyperlipidemia    Nephrolithiasis    per CT non-obstructive stone left side   Neurogenic claudication due to lumbar spinal stenosis    per pt occasional   OA (osteoarthritis)    Phimosis    PTSD (post-traumatic stress disorder)    Right ureteral stone    Type 2 diabetes mellitus treated with insulin ([place])    followed by pcp   Wears glasses    Wears hearing aid in both ears     Past Surgical History:  Procedure Laterality Date   CIRCUMCISION N/A 09/07/2018   Procedure: CIRCUMCISION ADULT;  Surgeon: Ceasar Mons, MD;  Location: [facility];  Service: Urology;  Laterality: N/A;   COLONOSCOPY  last one 10-23-2014   CYSTOSCOPY/RETROGRADE/URETEROSCOPY  2000 approx.   CYSTOSCOPY/URETEROSCOPY/HOLMIUM LASER/STENT  PLACEMENT Right 09/07/2018   Procedure: CYSTOSCOPY/RETROGRADE/URETEROSCOPY/HOLMIUM LASER/STENT PLACEMENT;  Surgeon: Ceasar Mons, MD;  Location[facility];  Service: Urology;  Laterality: Right;  ONLY NEEDS 90 MIN FOR ALL PROCEDURES   LUMBAR FUSION  07/2011   L4- S1   LUMBAR FUSION  10-04-2017   dr Carloyn Manner in [place], [place]   L2 -- L4 to previous fusion L4-S1   LUMBAR LAMINECTOMY  08-03-2006  dr Carloyn Manner [facility]    L4-5   PERCUTANEOUS NEPHROSTOLITHOTOMY  2000 approx   ROTATOR CUFF REPAIR Bilateral right 2015;  left ?   TOTAL KNEE ARTHROPLASTY Right 02/26/2015   Procedure: TOTAL KNEE ARTHROPLASTY;  Surgeon: Marchia Bond, MD;  Location: [facility];  Service: Orthopedics;  Laterality: Right;   VIDEO ASSISTED THORACOSCOPY (VATS)/DECORTICATION Left 06-29-2007  dr Arlyce Dice [facility]    mini thoractomy and drainage left-sided empyema    There were no vitals filed for this visit.   Subjective Assessment - 08/13/20 1440     Subjective Patient says he tried HEP but ab brace hurt his back. He is still waiting on walking sticks and he has been busy, so mot much walking since eval.    Limitations Lifting;Standing;Walking    How long can you sit comfortably? 10-15 min  How long can you stand comfortably? 10-15 min    How long can you walk comfortably? 10-15 min    Currently in Pain? Yes    Pain Score 3     Pain Location Back    Pain Orientation Lower;Posterior    Pain Descriptors / Indicators Aching;Sore    Pain Type Chronic pain    Pain Onset More than a month ago    Pain Frequency Constant                               [facility] Adult PT Treatment/Exercise - 08/13/20 0001       Lumbar Exercises: Stretches   Passive Hamstring Stretch 5 reps;10 seconds    Passive Hamstring Stretch Limitations seated    Single Knee to Chest Stretch 5 reps;10 seconds    Lower Trunk Rotation 5 reps;10 seconds      Lumbar Exercises: Supine   Ab Set 10 reps;5 seconds    Bent Knee Raise 20 reps     Bridge 10 reps;5 seconds    Straight Leg Raise 10 reps                      PT Short Term Goals - 08/06/20 1259       PT SHORT TERM GOAL #1   Title Patient will be independent with HEP in order to improve functional outcomes.    Time 3    Period Weeks    Status New    Target Date 08/27/20      PT SHORT TERM GOAL #2   Title Pt single leg stance to be at least 10 seconds to reduce risk of falling     Baseline 3-5 sec left/right    Time 3    Period Weeks    Status New    Target Date 08/27/20      PT SHORT TERM GOAL #3   Title Demo improved BLE strength and balance as evidenced by 5xSTS test of 15 sec    Baseline 23 sec with BUE push-off    Time 3    Period Weeks    Status New    Target Date 08/27/20               PT Long Term Goals - 08/06/20 1301       PT LONG TERM GOAL #1   Title Patient will report at least 25% improvement in symptoms for improved quality of life.    Time 6    Period Weeks    Status New    Target Date 09/17/20      PT LONG TERM GOAL #2   Title Patient will be able to complete 5x STS in under 11.4 seconds in order to reduce the risk of falls.    Baseline 23 sec    Time 6    Period Weeks    Status New    Target Date 09/17/20      PT LONG TERM GOAL #3   Title Demo improved gait velocity as evidenced by distance of 300 ft during 2MWT    Baseline 226 ft    Time 6    Period Weeks    Status New    Target Date 09/17/20                   Plan - 08/13/20 1508     Clinical Impression Statement Reviewed goals  and HEP. Initiated ther ex. Progressed lumbar and hip mobility. Also progressed core strengthening activity within pain free range. Patient educated on purpose and function of all added exercises. Patient cued to avoid holding breath during most of table activity. Updated and issued HEP handout. Patient will continue to benefit from skilled therapy services to reduce deficits and improve functional ability.     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/Moderate complexity    Rehab Potential Fair   pt reports less than optimal outcomes with past therapy and reports non-compliance with HEP and physical activity   PT Frequency 2x / week    PT Duration 6 weeks    PT Treatment/Interventions ADLs/Self Care Home Management;Aquatic Therapy;Gait training;Stair training;Functional mobility training;Therapeutic activities;Therapeutic exercise;Balance training;Patient/family education;Neuromuscular re-education;Manual techniques;Dry needling;Taping;Spinal Manipulations;Joint Manipulations    PT Next Visit Plan soft tissue mobilization to lumbar spine trigger points, core strength, BLE strength. Progress to standing activity.    PT Home Exercise Plan ab set, walking with walking stick/trekking poles 8/9 seated HS stretch, LTR, bent knee raise    Consulted and Agree with Plan of Care Patient             Patient will benefit from skilled therapeutic intervention in order to improve the following deficits and impairments:  Abnormal gait, Decreased activity tolerance, Decreased balance, Decreased endurance, Decreased strength, Decreased range of motion, Difficulty walking, Postural dysfunction, Improper body mechanics, Pain  Visit Diagnosis: Difficulty in walking, not elsewhere classified  Muscle weakness (generalized)  Unsteadiness on feet     Problem List Patient Active Problem List   Diagnosis Date Noted   Primary localized osteoarthritis of right knee 02/26/2015   Fecal incontinence 09/24/2014   Precordial pain 01/18/2014   Adhesive capsulitis of shoulder 06/15/2013   Decreased range of motion of right shoulder 05/19/2013   Muscle weakness  (generalized) 05/19/2013   Diabetic autonomic neuropathy ([place]) 05/20/2012   Other and unspecified hyperlipidemia 05/20/2012   Insomnia 05/20/2012   Essential hypertension, benign 05/20/2012   3:15 PM, 08/13/20 Josue Hector PT DPT  Physical Therapist with [facility]  [phone] [place] [address] [place], [place], [address] Phone: [phone]   Fax:  [phone]  Name: Logan Hooper MRN: [id number] Date of Birth: Oct 03, 1945

## 2020-08-13 NOTE — Patient Instructions (Signed)
Access Code: [email or web address] URL: [email or web address] Date: 08/13/2020 Prepared by: Josue Hector  Exercises Seated Hamstring Stretch - 2 x daily - 7 x weekly - 1 sets - 10 reps - 10 second hold Supine March - 2 x daily - 7 x weekly - 2 sets - 10 reps Supine Lower Trunk Rotation - 2 x daily - 7 x weekly - 1 sets - 10 reps - 10 second hold

## 2020-08-19 ENCOUNTER — Other Ambulatory Visit: Payer: Self-pay

## 2020-08-19 ENCOUNTER — Ambulatory Visit (HOSPITAL_COMMUNITY): Payer: Medicare Other | Admitting: Physical Therapy

## 2020-08-19 DIAGNOSIS — R262 Difficulty in walking, not elsewhere classified: Secondary | ICD-10-CM | POA: Diagnosis not present

## 2020-08-19 DIAGNOSIS — M6281 Muscle weakness (generalized): Secondary | ICD-10-CM

## 2020-08-19 DIAGNOSIS — R2681 Unsteadiness on feet: Secondary | ICD-10-CM

## 2020-08-19 NOTE — Therapy (Signed)
[facility] [address] [place], [place], [address] Phone: [phone]   Fax:  [phone]  Physical Therapy Treatment  Patient Details  Name: Logan Hooper MRN: [id number] Date of Birth: 1945-08-15 Referring Provider (PT): Sueanne Margarita, DO   Encounter Date: 08/19/2020   PT End of Session - 08/19/20 1036     Visit Number 3    Number of Visits 12    Date for PT Re-Evaluation 09/17/20    Authorization Type Medicare primary; [facility] MCR supplement secondary    Progress Note Due on Visit 10    PT Start Time 1000    PT Stop Time [id number]    PT Time Calculation (min) 40 min    Activity Tolerance Patient tolerated treatment well    Behavior During Therapy [facility] for tasks assessed/performed             Past Medical History:  Diagnosis Date   Diabetic neuropathy ([place])    ED (erectile dysfunction)    Essential hypertension    Full dentures    Function kidney decreased    right side per pt   History of kidney stones    History of nuclear stress test    01-23-2014 (in epic) low risk w/ no ischemia, normal LV function and wall motion , ef 55%   History of pleural empyema    s/p  VATS w/ drainage 2009   Hyperlipidemia    Nephrolithiasis    per CT non-obstructive stone left side   Neurogenic claudication due to lumbar spinal stenosis    per pt occasional   OA (osteoarthritis)    Phimosis    PTSD (post-traumatic stress disorder)    Right ureteral stone    Type 2 diabetes mellitus treated with insulin ([place])    followed by pcp   Wears glasses    Wears hearing aid in both ears     Past Surgical History:  Procedure Laterality Date   CIRCUMCISION N/A 09/07/2018   Procedure: CIRCUMCISION ADULT;  Surgeon: Ceasar Mons, MD;  Location: [facility];  Service: Urology;  Laterality: N/A;   COLONOSCOPY  last one 10-23-2014   CYSTOSCOPY/RETROGRADE/URETEROSCOPY  2000 approx.   CYSTOSCOPY/URETEROSCOPY/HOLMIUM LASER/STENT  PLACEMENT Right 09/07/2018   Procedure: CYSTOSCOPY/RETROGRADE/URETEROSCOPY/HOLMIUM LASER/STENT PLACEMENT;  Surgeon: Ceasar Mons, MD;  Location: [facility];  Service: Urology;  Laterality: Right;  ONLY NEEDS 90 MIN FOR ALL PROCEDURES   LUMBAR FUSION  07/2011   L4- S1   LUMBAR FUSION  10-04-2017   dr Carloyn Manner in [place], [place]   L2 -- L4 to previous fusion L4-S1   LUMBAR LAMINECTOMY  08-03-2006  dr Carloyn Manner [facility]    L4-5   PERCUTANEOUS NEPHROSTOLITHOTOMY  2000 approx   ROTATOR CUFF REPAIR Bilateral right 2015;  left ?   TOTAL KNEE ARTHROPLASTY Right 02/26/2015   Procedure: TOTAL KNEE ARTHROPLASTY;  Surgeon: Marchia Bond, MD;  Location: [facility];  Service: Orthopedics;  Laterality: Right;   VIDEO ASSISTED THORACOSCOPY (VATS)/DECORTICATION Left 06-29-2007  dr Arlyce Dice [facility]    mini thoractomy and drainage left-sided empyema    There were no vitals filed for this visit.   Subjective Assessment - 08/19/20 1003     Subjective PT states that he can tell that his legs are getting stronger.    Limitations Lifting;Standing;Walking    How long can you sit comfortably? 10-15 min    How long can you stand comfortably? 10-15 min    How long can you walk comfortably?  10-15 min    Currently in Pain? Yes    Pain Score 3     Pain Location Back    Pain Orientation Lower    Pain Descriptors / Indicators Aching;Sore    Pain Type Chronic pain    Pain Radiating Towards buttock    Pain Onset More than a month ago    Pain Frequency Constant    Aggravating Factors  walking    Pain Relieving Factors not sure                               [facility] Adult PT Treatment/Exercise - 08/19/20 0001       Lumbar Exercises: Stretches   Active Hamstring Stretch Left;Right;3 reps;10 seconds    Single Knee to Chest Stretch Left;Right;3 reps;20 seconds    Prone on Elbows Stretch 5 reps;10 seconds    Other Lumbar Stretch Exercise hip excursion x 3      Lumbar Exercises: Standing   Functional  Squats 5 reps      Lumbar Exercises: Seated   Sit to Stand 10 reps      Lumbar Exercises: Supine   Ab Set 10 reps    Dead Bug 10 reps    Bridge 10 reps      Lumbar Exercises: Prone   Other Prone Lumbar Exercises shoulder extensionB, scapular retraction with rows.    Other Prone Lumbar Exercises Glut set 5" x 10      Manual Therapy   Manual Therapy Soft tissue mobilization    Manual therapy comments completed seperate from all other aspects of treatment.    Soft tissue mobilization to improve motion and tightnes of paraspinal mm                      PT Short Term Goals - 08/19/20 1039       PT SHORT TERM GOAL #1   Title Patient will be independent with HEP in order to improve functional outcomes.    Time 3    Period Weeks    Status On-going    Target Date 08/27/20      PT SHORT TERM GOAL #2   Title Pt single leg stance to be at least 10 seconds to reduce risk of falling     Baseline 3-5 sec left/right    Time 3    Period Weeks    Status On-going    Target Date 08/27/20      PT SHORT TERM GOAL #3   Title Demo improved BLE strength and balance as evidenced by 5xSTS test of 15 sec    Baseline 23 sec with BUE push-off    Time 3    Period Weeks    Status On-going    Target Date 08/27/20      PT SHORT TERM GOAL #4   Status On-going               PT Long Term Goals - 08/19/20 1039       PT LONG TERM GOAL #1   Title Patient will report at least 25% improvement in symptoms for improved quality of life.    Time 6    Period Weeks    Status On-going      PT LONG TERM GOAL #2   Title Patient will be able to complete 5x STS in under 11.4 seconds in order to reduce the risk of falls.  Baseline 23 sec    Time 6    Period Weeks    Status On-going      PT LONG TERM GOAL #3   Title Demo improved gait velocity as evidenced by distance of 300 ft during 2MWT    Baseline 226 ft    Time 6    Period Weeks    Status On-going      PT LONG TERM GOAL #4    Status On-going                   Plan - 08/19/20 1036     Clinical Impression Statement Added flexibility and strengthening exericses with multiple verbal cuing needed for proper technique.  Stressed the importance of walking to the patient.    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/Moderate complexity    Rehab Potential Fair   pt reports less than optimal outcomes with past therapy and reports non-compliance with HEP and physical activity   PT Frequency 2x / week    PT Duration 6 weeks    PT Treatment/Interventions ADLs/Self Care Home Management;Aquatic Therapy;Gait training;Stair training;Functional mobility training;Therapeutic activities;Therapeutic exercise;Balance training;Patient/family education;Neuromuscular re-education;Manual techniques;Dry needling;Taping;Spinal Manipulations;Joint Manipulations    PT Next Visit Plan See how long pt is walking for,continue with  core strength, BLE strength.    PT Home Exercise Plan ab set, walking with walking stick/trekking poles 8/9 seated HS stretch, LTR, bent knee raise    Consulted and Agree with Plan of Care Patient             Patient will benefit from skilled therapeutic intervention in order to improve the following deficits and impairments:  Abnormal gait, Decreased activity tolerance, Decreased balance, Decreased endurance, Decreased strength, Decreased range of motion, Difficulty walking, Postural dysfunction, Improper body mechanics, Pain  Visit Diagnosis: Difficulty in walking, not elsewhere classified  Muscle weakness (generalized)  Unsteadiness on feet     Problem List Patient Active Problem List   Diagnosis Date Noted   Primary localized  osteoarthritis of right knee 02/26/2015   Fecal incontinence 09/24/2014   Precordial pain 01/18/2014   Adhesive capsulitis of shoulder 06/15/2013   Decreased range of motion of right shoulder 05/19/2013   Muscle weakness (generalized) 05/19/2013   Diabetic autonomic neuropathy ([place]) 05/20/2012   Other and unspecified hyperlipidemia 05/20/2012   Insomnia 05/20/2012   Essential hypertension, benign 05/20/2012   Rayetta Humphrey, PT CLT [phone]  08/19/2020, 10:45 AM  [facility] [address] [place], [place], [address] Phone: [phone]   Fax:  [phone]  Name: Logan Hooper MRN: [id number] Date of Birth: [birth year]

## 2020-08-21 ENCOUNTER — Encounter (HOSPITAL_COMMUNITY): Payer: Self-pay

## 2020-08-21 ENCOUNTER — Other Ambulatory Visit: Payer: Self-pay

## 2020-08-21 ENCOUNTER — Ambulatory Visit (HOSPITAL_COMMUNITY): Payer: Medicare Other

## 2020-08-21 DIAGNOSIS — R262 Difficulty in walking, not elsewhere classified: Secondary | ICD-10-CM

## 2020-08-21 DIAGNOSIS — M6281 Muscle weakness (generalized): Secondary | ICD-10-CM

## 2020-08-21 DIAGNOSIS — R2681 Unsteadiness on feet: Secondary | ICD-10-CM

## 2020-08-21 NOTE — Therapy (Signed)
[facility] [address] [place], [place], [address] Phone: [phone]   Fax:  [phone]  Physical Therapy Treatment  Patient Details  Name: Logan Hooper MRN: [id number] Date of Birth: December 03, 1945 Referring Provider (PT): Sueanne Margarita, DO   Encounter Date: 08/21/2020   PT End of Session - 08/21/20 0959     Visit Number 4    Number of Visits 12    Date for PT Re-Evaluation 09/17/20    Authorization Type Medicare primary; [facility] MCR supplement secondary    Progress Note Due on Visit 10    PT Start Time 0953    PT Stop Time 1039    PT Time Calculation (min) 46 min    Activity Tolerance Patient tolerated treatment well    Behavior During Therapy [facility] for tasks assessed/performed             Past Medical History:  Diagnosis Date   Diabetic neuropathy [facility])    ED (erectile dysfunction)    Essential hypertension    Full dentures    Function kidney decreased    right side per pt   History of kidney stones    History of nuclear stress test    01-23-2014 (in epic) low risk w/ no ischemia, normal LV function and wall motion , ef 55%   History of pleural empyema    s/p  VATS w/ drainage 2009   Hyperlipidemia    Nephrolithiasis    per CT non-obstructive stone left side   Neurogenic claudication due to lumbar spinal stenosis    per pt occasional   OA (osteoarthritis)    Phimosis    PTSD (post-traumatic stress disorder)    Right ureteral stone    Type 2 diabetes mellitus treated with insulin ([place])    followed by pcp   Wears glasses    Wears hearing aid in both ears     Past Surgical History:  Procedure Laterality Date   CIRCUMCISION N/A 09/07/2018   Procedure: CIRCUMCISION ADULT;  Surgeon: Ceasar Mons, MD;  Location: [facility];  Service: Urology;  Laterality: N/A;   COLONOSCOPY  last one 10-23-2014   CYSTOSCOPY/RETROGRADE/URETEROSCOPY  2000 approx.   CYSTOSCOPY/URETEROSCOPY/HOLMIUM LASER/STENT  PLACEMENT Right 09/07/2018   Procedure: CYSTOSCOPY/RETROGRADE/URETEROSCOPY/HOLMIUM LASER/STENT PLACEMENT;  Surgeon: Ceasar Mons, MD;  Location[facility];  Service: Urology;  Laterality: Right;  ONLY NEEDS 90 MIN FOR ALL PROCEDURES   LUMBAR FUSION  07/2011   L4- S1   LUMBAR FUSION  10-04-2017   dr Carloyn Manner in [place], [place]   L2 -- L4 to previous fusion L4-S1   LUMBAR LAMINECTOMY  08-03-2006  dr Carloyn Manner [facility]    L4-5   PERCUTANEOUS NEPHROSTOLITHOTOMY  2000 approx   ROTATOR CUFF REPAIR Bilateral right 2015;  left ?   TOTAL KNEE ARTHROPLASTY Right 02/26/2015   Procedure: TOTAL KNEE ARTHROPLASTY;  Surgeon: Marchia Bond, MD;  Location: [facility];  Service: Orthopedics;  Laterality: Right;   VIDEO ASSISTED THORACOSCOPY (VATS)/DECORTICATION Left 06-29-2007  dr Arlyce Dice [facility]    mini thoractomy and drainage left-sided empyema    There were no vitals filed for this visit.   Subjective Assessment - 08/21/20 0958     Subjective Pt stated he is feeling better, reports improvements with strength and balance.  Reports improvements with stairs at home.  Admits to not beginning walking program, plans to begin soon.    Currently in Pain? Yes    Pain Score 2     Pain  Location Back    Pain Orientation Lower    Pain Descriptors / Indicators Aching;Sore    Pain Type Chronic pain    Pain Onset More than a month ago    Pain Frequency Constant    Aggravating Factors  walking    Pain Relieving Factors not sure                [facility] PT Assessment - 08/21/20 0001       Assessment   Medical Diagnosis Unsteadiness on feet    Referring Provider (PT) Sueanne Margarita, DO    Next MD Visit Maybe September                           [facility] Adult PT Treatment/Exercise - 08/21/20 0001       Ambulation/Gait   Ambulation Distance (Feet) 392 Feet    Assistive device None    Gait Pattern Step-through pattern;Trunk flexed;Decreased stride length    Gait Comments 2MW       Posture/Postural Control   Posture/Postural Control Postural limitations    Postural Limitations Flexed trunk      Exercises   Exercises Lumbar      Lumbar Exercises: Stretches   Active Hamstring Stretch Left;Right;3 reps;10 seconds    Active Hamstring Stretch Limitations supine wiht hands behind knee    Standing Extension 10 reps    Prone on Elbows Stretch Limitations 2 min      Lumbar Exercises: Standing   Functional Squats 5 reps    Functional Squats Limitations 2 sets    Row 15 reps    Theraband Level (Row) Level 3 (Green)    Shoulder Extension 10 reps    Theraband Level (Shoulder Extension) Level 3 (Green)    Shoulder Extension Limitations cueing for form/mechanics and reduce forward head    Other Standing Lumbar Exercises 3D hip excursion 10x      Lumbar Exercises: Seated   Sit to Stand 10 reps    Sit to Stand Limitations eccentric control    Other Seated Lumbar Exercises wback 10      Lumbar Exercises: Supine   Ab Set 10 reps    Dead Bug 10 reps;3 seconds    Dead Bug Limitations with ab set    Bridge 10 reps      Lumbar Exercises: Prone   Other Prone Lumbar Exercises shoulder extensionB, scapular retraction with rows.      Manual Therapy   Manual Therapy Soft tissue mobilization    Manual therapy comments completed seperate from all other aspects of treatment.    Soft tissue mobilization to improve motion and tightnes of paraspinal mm                      PT Short Term Goals - 08/19/20 1039       PT SHORT TERM GOAL #1   Title Patient will be independent with HEP in order to improve functional outcomes.    Time 3    Period Weeks    Status On-going    Target Date 08/27/20      PT SHORT TERM GOAL #2   Title Pt single leg stance to be at least 10 seconds to reduce risk of falling     Baseline 3-5 sec left/right    Time 3    Period Weeks    Status On-going    Target Date 08/27/20      PT SHORT  TERM GOAL #3   Title Demo improved BLE strength  and balance as evidenced by 5xSTS test of 15 sec    Baseline 23 sec with BUE push-off    Time 3    Period Weeks    Status On-going    Target Date 08/27/20      PT SHORT TERM GOAL #4   Status On-going               PT Long Term Goals - 08/19/20 1039       PT LONG TERM GOAL #1   Title Patient will report at least 25% improvement in symptoms for improved quality of life.    Time 6    Period Weeks    Status On-going      PT LONG TERM GOAL #2   Title Patient will be able to complete 5x STS in under 11.4 seconds in order to reduce the risk of falls.    Baseline 23 sec    Time 6    Period Weeks    Status On-going      PT LONG TERM GOAL #3   Title Demo improved gait velocity as evidenced by distance of 300 ft during 2MWT    Baseline 226 ft    Time 6    Period Weeks    Status On-going      PT LONG TERM GOAL #4   Status On-going                   Plan - 08/21/20 1024     Clinical Impression Statement Began session with 2MW with ability to increased cadence.  Stressed importance of beginning walking program and increase time as able.  Added postural strengthening to POC and continues with flexibility and strengthening exercises. Pt required multiple verbal cues for proper form and technique.  Reports of pain resolved at [facility].    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/Moderate complexity    Clinical Decision Making Moderate    Rehab Potential Fair    PT Frequency 2x / week    PT Duration 6 weeks    PT Treatment/Interventions ADLs/Self Care Home Management;Aquatic Therapy;Gait training;Stair training;Functional mobility training;Therapeutic activities;Therapeutic exercise;Balance training;Patient/family  education;Neuromuscular re-education;Manual techniques;Dry needling;Taping;Spinal Manipulations;Joint Manipulations    PT Next Visit Plan Next session add prone SAR, SLR.  See how long pt is walking for,continue with  core strength, BLE strength.    PT Home Exercise Plan ab set, walking with walking stick/trekking poles 8/9 seated HS stretch, LTR, bent knee raise    Consulted and Agree with Plan of Care Patient             Patient will benefit from skilled therapeutic intervention in order to improve the following deficits and impairments:  Abnormal gait, Decreased activity tolerance, Decreased balance, Decreased endurance, Decreased strength, Decreased range of motion, Difficulty walking, Postural dysfunction, Improper body mechanics, Pain  Visit Diagnosis: Difficulty in walking, not elsewhere classified  Muscle weakness (generalized)  Unsteadiness on feet     Problem List Patient Active Problem List   Diagnosis Date Noted   Primary localized osteoarthritis of right knee 02/26/2015   Fecal incontinence 09/24/2014   Precordial pain 01/18/2014   Adhesive capsulitis of shoulder 06/15/2013   Decreased range of motion of right shoulder 05/19/2013   Muscle weakness (generalized) 05/19/2013   Diabetic  autonomic neuropathy ([place]) 05/20/2012   Other and unspecified hyperlipidemia 05/20/2012   Insomnia 05/20/2012   Essential hypertension, benign 05/20/2012   Ihor Austin, LPTA/CLT; CBIS [phone]  Aldona Lento 08/21/2020, 10:43 AM  [place], [place], [address] Phone: [phone]   Fax:  [phone]  Name: Logan Hooper MRN: [id number] Date of Birth: 1945/09/04

## 2020-08-26 ENCOUNTER — Other Ambulatory Visit: Payer: Self-pay

## 2020-08-26 ENCOUNTER — Ambulatory Visit (HOSPITAL_COMMUNITY): Payer: Medicare Other | Admitting: Physical Therapy

## 2020-08-26 DIAGNOSIS — R262 Difficulty in walking, not elsewhere classified: Secondary | ICD-10-CM | POA: Diagnosis not present

## 2020-08-26 DIAGNOSIS — M6281 Muscle weakness (generalized): Secondary | ICD-10-CM

## 2020-08-26 DIAGNOSIS — R2681 Unsteadiness on feet: Secondary | ICD-10-CM

## 2020-08-26 NOTE — Therapy (Signed)
[facility] [place], [place], [address] Phone: [phone]   Fax:  [phone]  Physical Therapy Treatment  Patient Details  Name: Logan Hooper MRN: [id number] Date of Birth: [birth year] Referring Provider (PT): Sueanne Margarita, DO   Encounter Date: 08/26/2020   PT End of Session - 08/26/20 1126     Visit Number 5    Number of Visits 12    Date for PT Re-Evaluation 09/17/20    Authorization Type Medicare primary; [facility] MCR supplement secondary    Progress Note Due on Visit 10    PT Start Time 1048    PT Stop Time 1129    PT Time Calculation (min) 41 min    Activity Tolerance Patient tolerated treatment well    Behavior During Therapy [facility] for tasks assessed/performed             Past Medical History:  Diagnosis Date   Diabetic neuropathy [facility])    ED (erectile dysfunction)    Essential hypertension    Full dentures    Function kidney decreased    right side per pt   History of kidney stones    History of nuclear stress test    01-23-2014 (in epic) low risk w/ no ischemia, normal LV function and wall motion , ef 55%   History of pleural empyema    s/p  VATS w/ drainage 2009   Hyperlipidemia    Nephrolithiasis    per CT non-obstructive stone left side   Neurogenic claudication due to lumbar spinal stenosis    per pt occasional   OA (osteoarthritis)    Phimosis    PTSD (post-traumatic stress disorder)    Right ureteral stone    Type 2 diabetes mellitus treated with insulin ([place])    followed by pcp   Wears glasses    Wears hearing aid in both ears     Past Surgical History:  Procedure Laterality Date   CIRCUMCISION N/A 09/07/2018   Procedure: CIRCUMCISION ADULT;  Surgeon: Ceasar Mons, MD;  Location: [facility];  Service: Urology;  Laterality: N/A;   COLONOSCOPY  last one 10-23-2014   CYSTOSCOPY/RETROGRADE/URETEROSCOPY  2000 approx.   CYSTOSCOPY/URETEROSCOPY/HOLMIUM LASER/STENT  PLACEMENT Right 09/07/2018   Procedure: CYSTOSCOPY/RETROGRADE/URETEROSCOPY/HOLMIUM LASER/STENT PLACEMENT;  Surgeon: Ceasar Mons, MD;  Location: [facility];  Service: Urology;  Laterality: Right;  ONLY NEEDS 90 MIN FOR ALL PROCEDURES   LUMBAR FUSION  07/2011   L4- S1   LUMBAR FUSION  10-04-2017   dr Carloyn Manner in [place], [place]   L2 -- L4 to previous fusion L4-S1   LUMBAR LAMINECTOMY  08-03-2006  dr Carloyn Manner [facility]    L4-5   PERCUTANEOUS NEPHROSTOLITHOTOMY  2000 approx   ROTATOR CUFF REPAIR Bilateral right 2015;  left ?   TOTAL KNEE ARTHROPLASTY Right 02/26/2015   Procedure: TOTAL KNEE ARTHROPLASTY;  Surgeon: Marchia Bond, MD;  Location: [place];  Service: Orthopedics;  Laterality: Right;   VIDEO ASSISTED THORACOSCOPY (VATS)/DECORTICATION Left 06-29-2007  dr Arlyce Dice [facility]    mini thoractomy and drainage left-sided empyema    There were no vitals filed for this visit.   Subjective Assessment - 08/26/20 1102     Subjective Pt states he has had a rough weekend.  states he didn't do much over the weekend except walk his dog in the yard.  Has not attempted a walking program but does his exercises at home.    Currently in Pain? Yes    Pain  Score 2     Pain Location Back    Pain Orientation Lower    Pain Descriptors / Indicators Aching                               [facility] Adult PT Treatment/Exercise - 08/26/20 0001       Lumbar Exercises: Standing   Heel Raises 15 reps    Forward Lunge 10 reps;Limitations    Forward Lunge Limitations onto 4" without UE assist    Scapular Retraction 15 reps;Theraband    Theraband Level (Scapular Retraction) Level 3 (Green)    Row 15 reps    Theraband Level (Row) Level 3 (Green)    Shoulder Extension 15 reps;Theraband    Theraband Level (Shoulder Extension) Level 3 (Green)    Other Standing Lumbar Exercises tandem stance best of 3.  Rt leading 11", Lt leading 12"    Other Standing Lumbar Exercises 3D hip excursion 10x      Lumbar  Exercises: Seated   Sit to Stand 10 reps    Sit to Stand Limitations eccentric control      Lumbar Exercises: Supine   Ab Set 10 reps    Dead Bug 10 reps;3 seconds    Dead Bug Limitations with ab set    Bridge 10 reps    Straight Leg Raise 10 reps                      PT Short Term Goals - 08/19/20 1039       PT SHORT TERM GOAL #1   Title Patient will be independent with HEP in order to improve functional outcomes.    Time 3    Period Weeks    Status On-going    Target Date 08/27/20      PT SHORT TERM GOAL #2   Title Pt single leg stance to be at least 10 seconds to reduce risk of falling     Baseline 3-5 sec left/right    Time 3    Period Weeks    Status On-going    Target Date 08/27/20      PT SHORT TERM GOAL #3   Title Demo improved BLE strength and balance as evidenced by 5xSTS test of 15 sec    Baseline 23 sec with BUE push-off    Time 3    Period Weeks    Status On-going    Target Date 08/27/20      PT SHORT TERM GOAL #4   Status On-going               PT Long Term Goals - 08/19/20 1039       PT LONG TERM GOAL #1   Title Patient will report at least 25% improvement in symptoms for improved quality of life.    Time 6    Period Weeks    Status On-going      PT LONG TERM GOAL #2   Title Patient will be able to complete 5x STS in under 11.4 seconds in order to reduce the risk of falls.    Baseline 23 sec    Time 6    Period Weeks    Status On-going      PT LONG TERM GOAL #3   Title Demo improved gait velocity as evidenced by distance of 300 ft during 2MWT    Baseline 226 ft    Time 6  Period Weeks    Status On-going      PT LONG TERM GOAL #4   Status On-going                   Plan - 08/26/20 1141     Clinical Impression Statement Pt continues to be somewhat sedentary outside of therapy.  Discussed importance of increasing activity/walking at home.  Pt required constant cues for form with all exercises as has poor  stabilization, compensates with stronger mm and does not hold/complete therex slowly.    Pt with noted poor coordination in LE's; added vectors and lunges without UE assist to work on this. Cues to maintain upright posturing and increase hold times.  max of 12 second holds with tandem stance without use of UE's.  Encouraged to utilize logroll technique when transitioning supine to/from sit.    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/Moderate complexity    Rehab Potential Fair    PT Frequency 2x / week    PT Duration 6 weeks    PT Treatment/Interventions ADLs/Self Care Home Management;Aquatic Therapy;Gait training;Stair training;Functional mobility training;Therapeutic activities;Therapeutic exercise;Balance training;Patient/family education;Neuromuscular re-education;Manual techniques;Dry needling;Taping;Spinal Manipulations;Joint Manipulations    PT Next Visit Plan Next session add prone SAR, SLR.  See how long pt is walking for,continue with  core strength and BLE strength/stability.    PT Home Exercise Plan ab set, walking with walking stick/trekking poles 8/9 seated HS stretch, LTR, bent knee raise    Consulted and Agree with Plan of Care Patient             Patient will benefit from skilled therapeutic intervention in order to improve the following deficits and impairments:  Abnormal gait, Decreased activity tolerance, Decreased balance, Decreased endurance, Decreased strength, Decreased range of motion, Difficulty walking, Postural dysfunction, Improper body mechanics, Pain  Visit Diagnosis: Muscle weakness (generalized)  Difficulty in walking, not elsewhere classified  Unsteadiness on feet     Problem List Patient Active  Problem List   Diagnosis Date Noted   Primary localized osteoarthritis of right knee 02/26/2015   Fecal incontinence 09/24/2014   Precordial pain 01/18/2014   Adhesive capsulitis of shoulder 06/15/2013   Decreased range of motion of right shoulder 05/19/2013   Muscle weakness (generalized) 05/19/2013   Diabetic autonomic neuropathy ([place]) 05/20/2012   Other and unspecified hyperlipidemia 05/20/2012   Insomnia 05/20/2012   Essential hypertension, benign 05/20/2012   Teena Irani, PTA/CLT [phone]  Teena Irani 08/26/2020, 11:43 AM  [facility] [address] [place], [place], [address] Phone: [phone]   Fax:  [phone]  Name: Logan Hooper MRN: [id number] Date of Birth: 08/27/1945

## 2020-08-28 ENCOUNTER — Ambulatory Visit (HOSPITAL_COMMUNITY): Payer: Medicare Other | Admitting: Physical Therapy

## 2020-08-28 ENCOUNTER — Encounter (HOSPITAL_COMMUNITY): Payer: Self-pay | Admitting: Physical Therapy

## 2020-08-28 ENCOUNTER — Other Ambulatory Visit: Payer: Self-pay

## 2020-08-28 DIAGNOSIS — R2681 Unsteadiness on feet: Secondary | ICD-10-CM

## 2020-08-28 DIAGNOSIS — R262 Difficulty in walking, not elsewhere classified: Secondary | ICD-10-CM

## 2020-08-28 DIAGNOSIS — M6281 Muscle weakness (generalized): Secondary | ICD-10-CM

## 2020-08-28 NOTE — Therapy (Signed)
Logan Hooper [address] [place], [place], [address] Phone: [phone]   Fax:  [phone]  Physical Therapy Treatment  Patient Details  Name: Logan Hooper MRN: [id number] Date of Birth: 1945/07/02 Referring Provider (PT): Sueanne Margarita, DO   Encounter Date: 08/28/2020   PT End of Session - 08/28/20 1311     Visit Number 6    Number of Visits 12    Date for PT Re-Evaluation 09/17/20    Authorization Type Medicare primary; [employer] MCR supplement secondary    Progress Note Due on Visit 10    PT Start Time 1315    PT Stop Time 1355    PT Time Calculation (min) 40 min    Activity Tolerance Patient tolerated treatment well    Behavior During Therapy [facility] for tasks assessed/performed             Past Medical History:  Diagnosis Date   Diabetic neuropathy ([place])    ED (erectile dysfunction)    Essential hypertension    Full dentures    Function kidney decreased    right side per pt   History of kidney stones    History of nuclear stress test    01-23-2014 (in epic) low risk w/ no ischemia, normal LV function and wall motion , ef 55%   History of pleural empyema    s/p  VATS w/ drainage 2009   Hyperlipidemia    Nephrolithiasis    per CT non-obstructive stone left side   Neurogenic claudication due to lumbar spinal stenosis    per pt occasional   OA (osteoarthritis)    Phimosis    PTSD (post-traumatic stress disorder)    Right ureteral stone    Type 2 diabetes mellitus treated with insulin ([place])    followed by pcp   Wears glasses    Wears hearing aid in both ears     Past Surgical History:  Procedure Laterality Date   CIRCUMCISION N/A 09/07/2018   Procedure: CIRCUMCISION ADULT;  Surgeon: Ceasar Mons, MD;  Location: [facility];  Service: Urology;  Laterality: N/A;   COLONOSCOPY  last one 10-23-2014   CYSTOSCOPY/RETROGRADE/URETEROSCOPY  2000 approx.   CYSTOSCOPY/URETEROSCOPY/HOLMIUM LASER/STENT  PLACEMENT Right 09/07/2018   Procedure: CYSTOSCOPY/RETROGRADE/URETEROSCOPY/HOLMIUM LASER/STENT PLACEMENT;  Surgeon: Ceasar Mons, MD;  Location: [facility];  Service: Urology;  Laterality: Right;  ONLY NEEDS 90 MIN FOR ALL PROCEDURES   LUMBAR FUSION  07/2011   L4- S1   LUMBAR FUSION  10-04-2017   dr Carloyn Manner in [place], [place]   L2 -- L4 to previous fusion L4-S1   LUMBAR LAMINECTOMY  08-03-2006  dr Carloyn Manner [facility]    L4-5   PERCUTANEOUS NEPHROSTOLITHOTOMY  2000 approx   ROTATOR CUFF REPAIR Bilateral right 2015;  left ?   TOTAL KNEE ARTHROPLASTY Right 02/26/2015   Procedure: TOTAL KNEE ARTHROPLASTY;  Surgeon: Marchia Bond, MD;  Location: [place];  Service: Orthopedics;  Laterality: Right;   VIDEO ASSISTED THORACOSCOPY (VATS)/DECORTICATION Left 06-29-2007  dr Arlyce Dice [facility]    mini thoractomy and drainage left-sided empyema    There were no vitals filed for this visit.   Subjective Assessment - 08/28/20 1310     Subjective PT has no complaints;completing his exercises at home.  HE tried to start a walking program but after going to his mailbox twice he was having quite a bit of pain.    Limitations Lifting;Standing;Walking    How long can you sit comfortably? 10-15 min  How long can you stand comfortably? 10-15 min    How long can you walk comfortably? 10-15 min    Patient Stated Goals walk more/less pain    Currently in Pain? Yes    Pain Score 2     Pain Location Back    Pain Orientation Lower    Pain Descriptors / Indicators Aching    Pain Type Chronic pain    Pain Onset More than a month ago    Pain Frequency Constant                               [facility] Adult PT Treatment/Exercise - 08/28/20 0001       Exercises   Exercises Lumbar      Lumbar Exercises: Stretches   Single Knee to Chest Stretch Left;Right;3 reps;20 seconds    Prone on Elbows Stretch Limitations 2 min    Other Lumbar Stretch Exercise hip excursion x 3      Lumbar Exercises: Standing    Functional Squats 10 reps    Other Standing Lumbar Exercises tandem stance best of 3.  Rt leading 11", Lt leading 12"    Other Standing Lumbar Exercises sls      Lumbar Exercises: Supine   Dead Bug 10 reps;3 seconds    Bridge 10 reps   10 seconds   Isometric Hip Flexion 10 reps    Other Supine Lumbar Exercises glut set x 10      Lumbar Exercises: Sidelying   Hip Abduction 10 reps;Both      Lumbar Exercises: Prone   Single Arm Raise Left;Right;5 reps    Straight Leg Raise 10 reps    Other Prone Lumbar Exercises --    Other Prone Lumbar Exercises --                      PT Short Term Goals - 08/28/20 1336       PT SHORT TERM GOAL #1   Title Patient will be independent with HEP in order to improve functional outcomes.    Time 3    Period Weeks    Status On-going    Target Date 08/27/20      PT SHORT TERM GOAL #2   Title Pt single leg stance to be at least 10 seconds to reduce risk of falling     Baseline 3-5 sec left/right    Time 3    Period Weeks    Status On-going    Target Date 08/27/20      PT SHORT TERM GOAL #3   Title Demo improved BLE strength and balance as evidenced by 5xSTS test of 15 sec    Baseline 23 sec with BUE push-off    Time 3    Period Weeks    Status On-going    Target Date 08/27/20      PT SHORT TERM GOAL #4   Status On-going               PT Long Term Goals - 08/28/20 1337       PT LONG TERM GOAL #1   Title Patient will report at least 25% improvement in symptoms for improved quality of life.    Time 6    Period Weeks    Status On-going      PT LONG TERM GOAL #2   Title Patient will be able to complete 5x STS in under  11.4 seconds in order to reduce the risk of falls.    Baseline 23 sec    Time 6    Period Weeks    Status On-going      PT LONG TERM GOAL #3   Title Demo improved gait velocity as evidenced by distance of 300 ft during 2MWT    Baseline 226 ft    Time 6    Period Weeks    Status On-going       PT LONG TERM GOAL #4   Status On-going                   Plan - 08/28/20 1313     Clinical Impression Statement Added prone exercises.  Getting into prone postition as well as the exercises themselves are difficult for pt.  PT encouraged to keep walking . Updated HEP    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/Moderate complexity    Rehab Potential Fair    PT Frequency 2x / week    PT Duration 6 weeks    PT Treatment/Interventions ADLs/Self Care Home Management;Aquatic Therapy;Gait training;Stair training;Functional mobility training;Therapeutic activities;Therapeutic exercise;Balance training;Patient/family education;Neuromuscular re-education;Manual techniques;Dry needling;Taping;Spinal Manipulations;Joint Manipulations    PT Next Visit Plan See how long pt is walking for,continue with  core strength and BLE strength/stability.    PT Home Exercise Plan ab set, walking with walking stick/trekking poles 8/9 seated HS stretch, LTR, bent knee raise; 8/24:  knee to chest, bridge and sit to stand    Consulted and Agree with Plan of Care Patient             Patient will benefit from skilled therapeutic intervention in order to improve the following deficits and impairments:  Abnormal gait, Decreased activity tolerance, Decreased balance, Decreased endurance, Decreased strength, Decreased range of motion, Difficulty walking, Postural dysfunction, Improper body mechanics, Pain  Visit Diagnosis: Difficulty in walking, not elsewhere classified  Unsteadiness on feet  Muscle weakness (generalized)     Problem List Patient Active Problem List   Diagnosis Date Noted   Primary localized osteoarthritis of right knee  02/26/2015   Fecal incontinence 09/24/2014   Precordial pain 01/18/2014   Adhesive capsulitis of shoulder 06/15/2013   Decreased range of motion of right shoulder 05/19/2013   Muscle weakness (generalized) 05/19/2013   Diabetic autonomic neuropathy ([place]) 05/20/2012   Other and unspecified hyperlipidemia 05/20/2012   Insomnia 05/20/2012   Essential hypertension, benign 05/20/2012   Rayetta Humphrey, PT CLT [phone]  08/28/2020, 1:55 PM  [facility] [address] [place], [place], [address] Phone: [phone]   Fax:  [phone]  Name: Logan Hooper MRN: [id number] Date of Birth: 24-Sep-1945

## 2020-09-02 ENCOUNTER — Encounter (HOSPITAL_COMMUNITY): Payer: Self-pay | Admitting: Physical Therapy

## 2020-09-02 ENCOUNTER — Other Ambulatory Visit: Payer: Self-pay

## 2020-09-02 ENCOUNTER — Ambulatory Visit (HOSPITAL_COMMUNITY): Payer: Medicare Other | Admitting: Physical Therapy

## 2020-09-02 DIAGNOSIS — R262 Difficulty in walking, not elsewhere classified: Secondary | ICD-10-CM

## 2020-09-02 DIAGNOSIS — R2681 Unsteadiness on feet: Secondary | ICD-10-CM

## 2020-09-02 DIAGNOSIS — M6281 Muscle weakness (generalized): Secondary | ICD-10-CM

## 2020-09-02 NOTE — Therapy (Signed)
[place], [place], [address] Phone: [phone]   Fax:  [phone]  Physical Therapy Treatment  Patient Details  Name: Logan Hooper MRN: [id number] Date of Birth: Apr 16, 1945 Referring Provider (PT): Sueanne Margarita, DO   Encounter Date: 09/02/2020   PT End of Session - 09/02/20 1124     Visit Number 7    Number of Visits 12    Date for PT Re-Evaluation 09/17/20    Authorization Type Medicare primary; [facility] MCR supplement secondary    Progress Note Due on Visit 10    PT Start Time 1126    PT Stop Time 1206    PT Time Calculation (min) 40 min    Activity Tolerance Patient tolerated treatment well    Behavior During Therapy [facility] for tasks assessed/performed             Past Medical History:  Diagnosis Date   Diabetic neuropathy [facility])    ED (erectile dysfunction)    Essential hypertension    Full dentures    Function kidney decreased    right side per pt   History of kidney stones    History of nuclear stress test    01-23-2014 (in epic) low risk w/ no ischemia, normal LV function and wall motion , ef 55%   History of pleural empyema    s/p  VATS w/ drainage 2009   Hyperlipidemia    Nephrolithiasis    per CT non-obstructive stone left side   Neurogenic claudication due to lumbar spinal stenosis    per pt occasional   OA (osteoarthritis)    Phimosis    PTSD (post-traumatic stress disorder)    Right ureteral stone    Type 2 diabetes mellitus treated with insulin ([place])    followed by pcp   Wears glasses    Wears hearing aid in both ears     Past Surgical History:  Procedure Laterality Date   CIRCUMCISION N/A 09/07/2018   Procedure: CIRCUMCISION ADULT;  Surgeon: Ceasar Mons, MD;  Location: [facility];  Service: Urology;  Laterality: N/A;   COLONOSCOPY  last one 10-23-2014   CYSTOSCOPY/RETROGRADE/URETEROSCOPY  2000 approx.   CYSTOSCOPY/URETEROSCOPY/HOLMIUM LASER/STENT  PLACEMENT Right 09/07/2018   Procedure: CYSTOSCOPY/RETROGRADE/URETEROSCOPY/HOLMIUM LASER/STENT PLACEMENT;  Surgeon: Ceasar Mons, MD;  Location: [facility];  Service: Urology;  Laterality: Right;  ONLY NEEDS 90 MIN FOR ALL PROCEDURES   LUMBAR FUSION  07/2011   L4- S1   LUMBAR FUSION  10-04-2017   dr Carloyn Manner in [place], [place]   L2 -- L4 to previous fusion L4-S1   LUMBAR LAMINECTOMY  08-03-2006  dr Carloyn Manner [facility]    L4-5   PERCUTANEOUS NEPHROSTOLITHOTOMY  2000 approx   ROTATOR CUFF REPAIR Bilateral right 2015;  left ?   TOTAL KNEE ARTHROPLASTY Right 02/26/2015   Procedure: TOTAL KNEE ARTHROPLASTY;  Surgeon: Marchia Bond, MD;  Location: [facility];  Service: Orthopedics;  Laterality: Right;   VIDEO ASSISTED THORACOSCOPY (VATS)/DECORTICATION Left 06-29-2007  dr Arlyce Dice [facility]    mini thoractomy and drainage left-sided empyema    There were no vitals filed for this visit.   Subjective Assessment - 09/02/20 1125     Subjective Patient states his R lower back is bothering him today. Exercises are going fair. His walking has been fair. Patient states walking 8-10 minutes.    Limitations Lifting;Standing;Walking    How long can you sit comfortably? 10-15 min    How long can you stand  comfortably? 10-15 min    How long can you walk comfortably? 10-15 min    Patient Stated Goals walk more/less pain    Currently in Pain? Yes    Pain Score 2     Pain Location Back    Pain Orientation Lower    Pain Descriptors / Indicators Aching    Pain Type Chronic pain    Pain Onset More than a month ago    Pain Frequency Constant                               [facility] Adult PT Treatment/Exercise - 09/02/20 0001       Lumbar Exercises: Stretches   Single Knee to Chest Stretch Left;Right;3 reps;20 seconds      Lumbar Exercises: Standing   Row 15 reps    Theraband Level (Row) Level 3 (Green)    Row Limitations 2 sets    Shoulder Extension 15 reps;Theraband    Theraband Level  (Shoulder Extension) Level 3 (Green)    Shoulder Extension Limitations 2 sets      Lumbar Exercises: Seated   Sit to Stand 10 reps    Sit to Stand Limitations eccentric control      Lumbar Exercises: Supine   Bridge 10 reps    Bridge Limitations 10 second holds      Lumbar Exercises: Sidelying   Hip Abduction Both;10 reps    Hip Abduction Limitations 2 sets      Lumbar Exercises: Prone   Straight Leg Raise 10 reps                    PT Education - 09/02/20 1125     Education Details HEP    Person(s) Educated Patient    Methods Explanation    Comprehension Verbalized understanding              PT Short Term Goals - 08/28/20 1336       PT SHORT TERM GOAL #1   Title Patient will be independent with HEP in order to improve functional outcomes.    Time 3    Period Weeks    Status On-going    Target Date 08/27/20      PT SHORT TERM GOAL #2   Title Pt single leg stance to be at least 10 seconds to reduce risk of falling     Baseline 3-5 sec left/right    Time 3    Period Weeks    Status On-going    Target Date 08/27/20      PT SHORT TERM GOAL #3   Title Demo improved BLE strength and balance as evidenced by 5xSTS test of 15 sec    Baseline 23 sec with BUE push-off    Time 3    Period Weeks    Status On-going    Target Date 08/27/20      PT SHORT TERM GOAL #4   Status On-going               PT Long Term Goals - 08/28/20 1337       PT LONG TERM GOAL #1   Title Patient will report at least 25% improvement in symptoms for improved quality of life.    Time 6    Period Weeks    Status On-going      PT LONG TERM GOAL #2   Title Patient will be able to complete 5x  STS in under 11.4 seconds in order to reduce the risk of falls.    Baseline 23 sec    Time 6    Period Weeks    Status On-going      PT LONG TERM GOAL #3   Title Demo improved gait velocity as evidenced by distance of 300 ft during 2MWT    Baseline 226 ft    Time 6     Period Weeks    Status On-going      PT LONG TERM GOAL #4   Status On-going                   Plan - 09/02/20 1124     Clinical Impression Statement Continued with core and hip strengthening which patient tolerates well with c/o increased symptoms following prone positioning. He requires intermittent cueing for positioning and mechanics with fair carry over. Given intermittent cueing for posture with standing exercises. Notes fatigue at end of session. Patient will continue to benefit from skilled physical therapy in order to reduce impairment and improve function.    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/Moderate complexity    Rehab Potential Fair    PT Frequency 2x / week    PT Duration 6 weeks    PT Treatment/Interventions ADLs/Self Care Home Management;Aquatic Therapy;Gait training;Stair training;Functional mobility training;Therapeutic activities;Therapeutic exercise;Balance training;Patient/family education;Neuromuscular re-education;Manual techniques;Dry needling;Taping;Spinal Manipulations;Joint Manipulations    PT Next Visit Plan continue with  core strength and BLE strength/stability.    PT Home Exercise Plan ab set, walking with walking stick/trekking poles 8/9 seated HS stretch, LTR, bent knee raise; 8/24:  knee to chest, bridge and sit to stand    Consulted and Agree with Plan of Care Patient             Patient will benefit from skilled therapeutic intervention in order to improve the following deficits and impairments:  Abnormal gait, Decreased activity tolerance, Decreased balance, Decreased endurance, Decreased strength, Decreased range of motion, Difficulty walking, Postural dysfunction, Improper body  mechanics, Pain  Visit Diagnosis: Difficulty in walking, not elsewhere classified  Unsteadiness on feet  Muscle weakness (generalized)     Problem List Patient Active Problem List   Diagnosis Date Noted   Primary localized osteoarthritis of right knee 02/26/2015   Fecal incontinence 09/24/2014   Precordial pain 01/18/2014   Adhesive capsulitis of shoulder 06/15/2013   Decreased range of motion of right shoulder 05/19/2013   Muscle weakness (generalized) 05/19/2013   Diabetic autonomic neuropathy ([place]) 05/20/2012   Other and unspecified hyperlipidemia 05/20/2012   Insomnia 05/20/2012   Essential hypertension, benign 05/20/2012     12:08 PM, 09/02/20 Mearl Latin PT, DPT Physical Therapist at [place], [place], [address] Phone: [phone]   Fax:  [phone]  Name: ZABIEN CUTHBERTSON MRN: [id number] Date of Birth: [birth year]

## 2020-09-04 ENCOUNTER — Ambulatory Visit (HOSPITAL_COMMUNITY): Payer: Medicare Other | Admitting: Physical Therapy

## 2020-09-04 ENCOUNTER — Other Ambulatory Visit: Payer: Self-pay

## 2020-09-04 DIAGNOSIS — R262 Difficulty in walking, not elsewhere classified: Secondary | ICD-10-CM | POA: Diagnosis not present

## 2020-09-04 DIAGNOSIS — R2681 Unsteadiness on feet: Secondary | ICD-10-CM

## 2020-09-04 DIAGNOSIS — M6281 Muscle weakness (generalized): Secondary | ICD-10-CM

## 2020-09-04 NOTE — Therapy (Signed)
[facility] [address] [place], [place], [address] Phone: [phone]   Fax:  [phone]  Physical Therapy Treatment  Patient Details  Name: Logan Hooper MRN: [id number] Date of Birth: [birth year] Referring Provider (PT): Sueanne Margarita, DO   Encounter Date: 09/04/2020   PT End of Session - 09/04/20 1652     Visit Number 8    Number of Visits 12    Date for PT Re-Evaluation 09/17/20    Authorization Type Medicare primary; [facility] MCR supplement secondary    Progress Note Due on Visit 10    PT Start Time 1404    PT Stop Time [id number]    PT Time Calculation (min) 44 min    Activity Tolerance Patient tolerated treatment well    Behavior During Therapy [facility] for tasks assessed/performed             Past Medical History:  Diagnosis Date   Diabetic neuropathy ([place])    ED (erectile dysfunction)    Essential hypertension    Full dentures    Function kidney decreased    right side per pt   History of kidney stones    History of nuclear stress test    01-23-2014 (in epic) low risk w/ no ischemia, normal LV function and wall motion , ef 55%   History of pleural empyema    s/p  VATS w/ drainage 2009   Hyperlipidemia    Nephrolithiasis    per CT non-obstructive stone left side   Neurogenic claudication due to lumbar spinal stenosis    per pt occasional   OA (osteoarthritis)    Phimosis    PTSD (post-traumatic stress disorder)    Right ureteral stone    Type 2 diabetes mellitus treated with insulin ([place])    followed by pcp   Wears glasses    Wears hearing aid in both ears     Past Surgical History:  Procedure Laterality Date   CIRCUMCISION N/A 09/07/2018   Procedure: CIRCUMCISION ADULT;  Surgeon: Ceasar Mons, MD;  Location[facility];  Service: Urology;  Laterality: N/A;   COLONOSCOPY  last one 10-23-2014   CYSTOSCOPY/RETROGRADE/URETEROSCOPY  2000 approx.   CYSTOSCOPY/URETEROSCOPY/HOLMIUM LASER/STENT  PLACEMENT Right 09/07/2018   Procedure: CYSTOSCOPY/RETROGRADE/URETEROSCOPY/HOLMIUM LASER/STENT PLACEMENT;  Surgeon: Ceasar Mons, MD;  [facility];  Service: Urology;  Laterality: Right;  ONLY NEEDS 90 MIN FOR ALL PROCEDURES   LUMBAR FUSION  07/2011   L4- S1   LUMBAR FUSION  10-04-2017   dr Carloyn Manner in [place], [place]   L2 -- L4 to previous fusion L4-S1   LUMBAR LAMINECTOMY  08-03-2006  dr Carloyn Manner [facility]    L4-5   PERCUTANEOUS NEPHROSTOLITHOTOMY  2000 approx   ROTATOR CUFF REPAIR Bilateral right 2015;  left ?   TOTAL KNEE ARTHROPLASTY Right 02/26/2015   Procedure: TOTAL KNEE ARTHROPLASTY;  Surgeon: Marchia Bond, MD;  Location: [facility];  Service: Orthopedics;  Laterality: Right;   VIDEO ASSISTED THORACOSCOPY (VATS)/DECORTICATION Left 06-29-2007  dr Arlyce Dice [facility]    mini thoractomy and drainage left-sided empyema    There were no vitals filed for this visit.   Subjective Assessment - 09/04/20 1407     Subjective pt reports pain of 2/10 today.  STates he walked up and down his driveway yesterday but his back has been bothering him so didn't go today.  Reports he is doing the exercises.    Currently in Pain? Yes    Pain Score 2  Pain Location Back    Pain Orientation Mid;Lower    Pain Descriptors / Indicators Aching                               [facility] Adult PT Treatment/Exercise - 09/04/20 0001       Lumbar Exercises: Stretches   Other Lumbar Stretch Exercise heelsqueezes 10X5"  holds      Lumbar Exercises: Standing   Scapular Retraction 15 reps;Theraband    Theraband Level (Scapular Retraction) Level 3 (Green)    Scapular Retraction Limitations 2 sets    Row 15 reps    Theraband Level (Row) Level 3 (Green)    Row Limitations 2 sets    Shoulder Extension 15 reps;Theraband    Theraband Level (Shoulder Extension) Level 3 (Green)    Shoulder Extension Limitations --   2 sets   Other Standing Lumbar Exercises vector stance 5X5" each LE       Lumbar Exercises: Seated   Sit to Stand 10 reps    Sit to Stand Limitations eccentric control      Lumbar Exercises: Supine   Bridge 20 reps    Bridge Limitations 2 sets of 10 reps with 5" holds    Straight Leg Raise 10 reps    Straight Leg Raises Limitations with abdominal contraction    Other Supine Lumbar Exercises glut set x 10 X5" holds      Lumbar Exercises: Sidelying   Hip Abduction Both;10 reps    Hip Abduction Limitations 2 sets      Lumbar Exercises: Prone   Straight Leg Raise 10 reps                    PT Education - 09/04/20 1651     Education Details encouraged to complete HEP    Person(s) Educated Patient    Methods Explanation    Comprehension Verbalized understanding              PT Short Term Goals - 08/28/20 1336       PT SHORT TERM GOAL #1   Title Patient will be independent with HEP in order to improve functional outcomes.    Time 3    Period Weeks    Status On-going    Target Date 08/27/20      PT SHORT TERM GOAL #2   Title Pt single leg stance to be at least 10 seconds to reduce risk of falling     Baseline 3-5 sec left/right    Time 3    Period Weeks    Status On-going    Target Date 08/27/20      PT SHORT TERM GOAL #3   Title Demo improved BLE strength and balance as evidenced by 5xSTS test of 15 sec    Baseline 23 sec with BUE push-off    Time 3    Period Weeks    Status On-going    Target Date 08/27/20      PT SHORT TERM GOAL #4   Status On-going               PT Long Term Goals - 08/28/20 1337       PT LONG TERM GOAL #1   Title Patient will report at least 25% improvement in symptoms for improved quality of life.    Time 6    Period Weeks    Status On-going  PT LONG TERM GOAL #2   Title Patient will be able to complete 5x STS in under 11.4 seconds in order to reduce the risk of falls.    Baseline 23 sec    Time 6    Period Weeks    Status On-going      PT LONG TERM GOAL #3   Title Demo  improved gait velocity as evidenced by distance of 300 ft during 2MWT    Baseline 226 ft    Time 6    Period Weeks    Status On-going      PT LONG TERM GOAL #4   Status On-going                   Plan - 09/04/20 1647     Clinical Impression Statement Continued with established core stab, postural strengthening and functional mobility.   Began vectors with cues to rest between repetitions as tends to rush though increasing pain due to postural fatigue.  Noted gluteal challenge with this activity.  Continued with mat activities requiring postural cues and cues to complete therex slowly and controlled.  Seated scapular retractions added to also work on stabilizers.    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/Moderate complexity    Rehab Potential Fair    PT Frequency 2x / week    PT Duration 6 weeks    PT Treatment/Interventions ADLs/Self Care Home Management;Aquatic Therapy;Gait training;Stair training;Functional mobility training;Therapeutic activities;Therapeutic exercise;Balance training;Patient/family education;Neuromuscular re-education;Manual techniques;Dry needling;Taping;Spinal Manipulations;Joint Manipulations    PT Next Visit Plan continue with core strength and BLE strength/stability.    PT Home Exercise Plan ab set, walking with walking stick/trekking poles 8/9 seated HS stretch, LTR, bent knee raise; 8/24:  knee to chest, bridge and sit to stand    Consulted and Agree with Plan of Care Patient             Patient will benefit from skilled therapeutic intervention in order to improve the following deficits and impairments:  Abnormal gait, Decreased activity tolerance, Decreased balance, Decreased endurance,  Decreased strength, Decreased range of motion, Difficulty walking, Postural dysfunction, Improper body mechanics, Pain  Visit Diagnosis: Difficulty in walking, not elsewhere classified  Unsteadiness on feet  Muscle weakness (generalized)     Problem List Patient Active Problem List   Diagnosis Date Noted   Primary localized osteoarthritis of right knee 02/26/2015   Fecal incontinence 09/24/2014   Precordial pain 01/18/2014   Adhesive capsulitis of shoulder 06/15/2013   Decreased range of motion of right shoulder 05/19/2013   Muscle weakness (generalized) 05/19/2013   Diabetic autonomic neuropathy ([place]) 05/20/2012   Other and unspecified hyperlipidemia 05/20/2012   Insomnia 05/20/2012   Essential hypertension, benign 05/20/2012   Teena Irani, PTA/CLT [phone]  Teena Irani 09/04/2020, 4:52 PM  [facility] [address] [place], [place], [address] Phone: [phone]   Fax:  [phone]  Name: Logan Hooper MRN: [id number] Date of Birth: February 08, 1945

## 2020-09-10 ENCOUNTER — Other Ambulatory Visit: Payer: Self-pay

## 2020-09-10 ENCOUNTER — Ambulatory Visit (HOSPITAL_COMMUNITY): Payer: Medicare Other | Attending: Internal Medicine

## 2020-09-10 DIAGNOSIS — R262 Difficulty in walking, not elsewhere classified: Secondary | ICD-10-CM | POA: Insufficient documentation

## 2020-09-10 DIAGNOSIS — R2681 Unsteadiness on feet: Secondary | ICD-10-CM | POA: Diagnosis present

## 2020-09-10 DIAGNOSIS — M6281 Muscle weakness (generalized): Secondary | ICD-10-CM | POA: Insufficient documentation

## 2020-09-10 NOTE — Therapy (Signed)
[facility] [address] [place], [place], [address] Phone: [phone]   Fax:  [phone]  Physical Therapy Treatment  Patient Details  Name: Logan Hooper MRN: [id number] Date of Birth: 1945-06-17 Referring Provider (PT): Sueanne Margarita, DO   Encounter Date: 09/10/2020   PT End of Session - 09/10/20 1009     Visit Number 9    Number of Visits 12    Date for PT Re-Evaluation 09/17/20    Authorization Type Medicare primary; [facility] MCR supplement secondary    Progress Note Due on Visit 10    PT Start Time 1000    PT Stop Time [id number]    PT Time Calculation (min) 45 min    Activity Tolerance Patient tolerated treatment well    Behavior During Therapy [facility] for tasks assessed/performed             Past Medical History:  Diagnosis Date   Diabetic neuropathy ([place])    ED (erectile dysfunction)    Essential hypertension    Full dentures    Function kidney decreased    right side per pt   History of kidney stones    History of nuclear stress test    01-23-2014 (in epic) low risk w/ no ischemia, normal LV function and wall motion , ef 55%   History of pleural empyema    s/p  VATS w/ drainage 2009   Hyperlipidemia    Nephrolithiasis    per CT non-obstructive stone left side   Neurogenic claudication due to lumbar spinal stenosis    per pt occasional   OA (osteoarthritis)    Phimosis    PTSD (post-traumatic stress disorder)    Right ureteral stone    Type 2 diabetes mellitus treated with insulin ([place])    followed by pcp   Wears glasses    Wears hearing aid in both ears     Past Surgical History:  Procedure Laterality Date   CIRCUMCISION N/A 09/07/2018   Procedure: CIRCUMCISION ADULT;  Surgeon: Ceasar Mons, MD;  Location: [facility];  Service: Urology;  Laterality: N/A;   COLONOSCOPY  last one 10-23-2014   CYSTOSCOPY/RETROGRADE/URETEROSCOPY  2000 approx.   CYSTOSCOPY/URETEROSCOPY/HOLMIUM LASER/STENT  PLACEMENT Right 09/07/2018   Procedure: CYSTOSCOPY/RETROGRADE/URETEROSCOPY/HOLMIUM LASER/STENT PLACEMENT;  Surgeon: Ceasar Mons, MD;  Location: [facility];  Service: Urology;  Laterality: Right;  ONLY NEEDS 90 MIN FOR ALL PROCEDURES   LUMBAR FUSION  07/2011   L4- S1   LUMBAR FUSION  10-04-2017   dr Carloyn Manner in [place], [place]   L2 -- L4 to previous fusion L4-S1   LUMBAR LAMINECTOMY  08-03-2006  dr Carloyn Manner '@[facility]    L4-5   PERCUTANEOUS NEPHROSTOLITHOTOMY  2000 approx   ROTATOR CUFF REPAIR Bilateral right 2015;  left ?   TOTAL KNEE ARTHROPLASTY Right 02/26/2015   Procedure: TOTAL KNEE ARTHROPLASTY;  Surgeon: Marchia Bond, MD;  Location: [facility];  Service: Orthopedics;  Laterality: Right;   VIDEO ASSISTED THORACOSCOPY (VATS)/DECORTICATION Left 06-29-2007  dr Arlyce Dice '@[facility]    mini thoractomy and drainage left-sided empyema    There were no vitals filed for this visit.   Subjective Assessment - 09/10/20 1011     Subjective Pt stated "its gonna be a rough day", reports increased LBP with damp weather.  Admits to not doing his exericses like he should.  Can tell therapy has helped with his hips.    Patient Stated Goals walk more/less pain    Currently in Pain?  Yes    Pain Score 3     Pain Location Back    Pain Orientation Lower    Pain Descriptors / Indicators Aching;Constant    Pain Type Chronic pain    Pain Onset More than a month ago    Pain Frequency Constant    Aggravating Factors  walking    Pain Relieving Factors not sure                               [facility] Adult PT Treatment/Exercise - 09/10/20 0001       Lumbar Exercises: Stretches   Prone on Elbows Stretch Limitations 2 min      Lumbar Exercises: Standing   Row 15 reps    Theraband Level (Row) Level 3 (Green)    Shoulder Extension 15 reps;Theraband    Theraband Level (Shoulder Extension) Level 3 (Green)    Other Standing Lumbar Exercises vector stance 5X5" each LE    Other Standing Lumbar  Exercises Wall arch with cueing for posture 5x      Lumbar Exercises: Supine   Bridge 20 reps    Bridge Limitations 2 sets of 10 reps with 5" holds      Lumbar Exercises: Sidelying   Hip Abduction Both;10 reps      Lumbar Exercises: Prone   Straight Leg Raise 10 reps    Other Prone Lumbar Exercises heel squeeze 10x 5"      Lumbar Exercises: Quadruped   [place]/Old Horse 5 reps                    PT Education - 09/10/20 1015     Education Details Educated importance of posture for pain control.    Person(s) Educated Patient    Methods Explanation;Tactile cues;Verbal cues;Demonstration    Comprehension Verbalized understanding;Need further instruction              PT Short Term Goals - 08/28/20 1336       PT SHORT TERM GOAL #1   Title Patient will be independent with HEP in order to improve functional outcomes.    Time 3    Period Weeks    Status On-going    Target Date 08/27/20      PT SHORT TERM GOAL #2   Title Pt single leg stance to be at least 10 seconds to reduce risk of falling     Baseline 3-5 sec left/right    Time 3    Period Weeks    Status On-going    Target Date 08/27/20      PT SHORT TERM GOAL #3   Title Demo improved BLE strength and balance as evidenced by 5xSTS test of 15 sec    Baseline 23 sec with BUE push-off    Time 3    Period Weeks    Status On-going    Target Date 08/27/20      PT SHORT TERM GOAL #4   Status On-going               PT Long Term Goals - 08/28/20 1337       PT LONG TERM GOAL #1   Title Patient will report at least 25% improvement in symptoms for improved quality of life.    Time 6    Period Weeks    Status On-going      PT LONG TERM GOAL #2   Title Patient will be  able to complete 5x STS in under 11.4 seconds in order to reduce the risk of falls.    Baseline 23 sec    Time 6    Period Weeks    Status On-going      PT LONG TERM GOAL #3   Title Demo improved gait velocity as evidenced by  distance of 300 ft during 2MWT    Baseline 226 ft    Time 6    Period Weeks    Status On-going      PT LONG TERM GOAL #4   Status On-going                   Plan - 09/10/20 1021     Clinical Impression Statement Pt limited by reports of increased arthritic pain due to damp weather.  Pt presents with trunk flexed and stiffness entrance.  Pt educated on importance of posture for pain control.  Added wall arch for postural strengthening and mobility exericses for pain control.  Continued with gluteal and postural strengthening exercises wiht cueing for form and posture through session.  EOS pt reports pain reduced.  Encouraged to increase compliance wiht HEP and walking program.    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/Moderate complexity    Clinical Decision Making Moderate    Rehab Potential Fair    PT Frequency 2x / week    PT Duration 6 weeks    PT Treatment/Interventions ADLs/Self Care Home Management;Aquatic Therapy;Gait training;Stair training;Functional mobility training;Therapeutic activities;Therapeutic exercise;Balance training;Patient/family education;Neuromuscular re-education;Manual techniques;Dry needling;Taping;Spinal Manipulations;Joint Manipulations    PT Next Visit Plan 10th visit progress note.  Add balance activities next session.  continue with core strength and BLE strength/stability.    PT Home Exercise Plan ab set, walking with walking stick/trekking poles 8/9 seated HS stretch, LTR, bent knee raise; 8/24:  knee to chest, bridge and sit to stand    Consulted and Agree with Plan of Care Patient             Patient will benefit from skilled therapeutic intervention in order to improve the following  deficits and impairments:  Abnormal gait, Decreased activity tolerance, Decreased balance, Decreased endurance, Decreased strength, Decreased range of motion, Difficulty walking, Postural dysfunction, Improper body mechanics, Pain  Visit Diagnosis: Difficulty in walking, not elsewhere classified  Unsteadiness on feet  Muscle weakness (generalized)     Problem List Patient Active Problem List   Diagnosis Date Noted   Primary localized osteoarthritis of right knee 02/26/2015   Fecal incontinence 09/24/2014   Precordial pain 01/18/2014   Adhesive capsulitis of shoulder 06/15/2013   Decreased range of motion of right shoulder 05/19/2013   Muscle weakness (generalized) 05/19/2013   Diabetic autonomic neuropathy ([place]) 05/20/2012   Other and unspecified hyperlipidemia 05/20/2012   Insomnia 05/20/2012   Essential hypertension, benign 05/20/2012   Ihor Austin, LPTA/CLT; CBIS [id number]  Aldona Lento 09/10/2020, 12:58 PM  [facility] [address] [place], [place], [address] Phone: [phone]   Fax:  [phone]  Name: Logan Hooper MRN: [id number] Date of Birth: 08/21/1945

## 2020-09-12 ENCOUNTER — Encounter (HOSPITAL_COMMUNITY): Payer: Self-pay | Admitting: Physical Therapy

## 2020-09-12 ENCOUNTER — Ambulatory Visit (HOSPITAL_COMMUNITY): Payer: Medicare Other | Admitting: Physical Therapy

## 2020-09-12 ENCOUNTER — Other Ambulatory Visit: Payer: Self-pay

## 2020-09-12 DIAGNOSIS — R2681 Unsteadiness on feet: Secondary | ICD-10-CM

## 2020-09-12 DIAGNOSIS — R262 Difficulty in walking, not elsewhere classified: Secondary | ICD-10-CM

## 2020-09-12 DIAGNOSIS — M6281 Muscle weakness (generalized): Secondary | ICD-10-CM

## 2020-09-12 NOTE — Therapy (Signed)
[place], [place], [address] Phone: [phone]   Fax:  [phone]  Physical Therapy Treatment/Progress note/Discharge summary  Patient Details  Name: Logan Hooper MRN: [id number] Date of Birth: 1945-10-23 Referring Provider (PT): Sueanne Margarita, DO   Encounter Date: 09/12/2020  Progress Note   Reporting Period 08/06/20 to 09/12/20   See note below for Objective Data and Assessment of Progress/Goals   PHYSICAL THERAPY DISCHARGE SUMMARY  Visits from Start of Care: 10  Current functional level related to goals / functional outcomes: See below   Remaining deficits: See below   Education / Equipment: See below   Patient agrees to discharge. Patient goals were met. Patient is being discharged due to being pleased with the current functional level.and meeting goals.     PT End of Session - 09/12/20 1358     Visit Number 10    Number of Visits 12    Date for PT Re-Evaluation 09/17/20    Authorization Type Medicare primary; [facility] MCR supplement secondary    Progress Note Due on Visit 48    PT Start Time 1400    PT Stop Time 1429    PT Time Calculation (min) 29 min    Activity Tolerance Patient tolerated treatment well    Behavior During Therapy WFL for tasks assessed/performed             Past Medical History:  Diagnosis Date   Diabetic neuropathy ([place])    ED (erectile dysfunction)    Essential hypertension    Full dentures    Function kidney decreased    right side per pt   History of kidney stones    History of nuclear stress test    01-23-2014 (in epic) low risk w/ no ischemia, normal LV function and wall motion , ef 55%   History of pleural empyema    s/p  VATS w/ drainage 2009   Hyperlipidemia    Nephrolithiasis    per CT non-obstructive stone left side   Neurogenic claudication due to lumbar spinal stenosis    per pt occasional   OA (osteoarthritis)    Phimosis    PTSD  (post-traumatic stress disorder)    Right ureteral stone    Type 2 diabetes mellitus treated with insulin ([place])    followed by pcp   Wears glasses    Wears hearing aid in both ears     Past Surgical History:  Procedure Laterality Date   CIRCUMCISION N/A 09/07/2018   Procedure: CIRCUMCISION ADULT;  Surgeon: Ceasar Mons, MD;  [facility];  Service: Urology;  Laterality: N/A;   COLONOSCOPY  last one 10-23-2014   CYSTOSCOPY/RETROGRADE/URETEROSCOPY  2000 approx.   CYSTOSCOPY/URETEROSCOPY/HOLMIUM LASER/STENT PLACEMENT Right 09/07/2018   Procedure: CYSTOSCOPY/RETROGRADE/URETEROSCOPY/HOLMIUM LASER/STENT PLACEMENT;  Surgeon: Ceasar Mons, MD;  Location: [facility];  Service: Urology;  Laterality: Right;  ONLY NEEDS 90 MIN FOR ALL PROCEDURES   LUMBAR FUSION  07/2011   L4- S1   LUMBAR FUSION  10-04-2017   dr Carloyn Manner in [place], [place]   L2 -- L4 to previous fusion L4-S1   LUMBAR LAMINECTOMY  08-03-2006  dr [facility]oyn Manner @[facility]    L4-5   PERCUTANEOUS NEPHROSTOLITHOTOMY  2000 approx   ROTATOR CUFF REPAIR Bilateral right 2015;  left ?   TOTAL KNEE ARTHROPLASTY Right 02/26/2015   Procedure: TOTAL KNEE ARTHROPLASTY;  Surgeon: Marchia Bond, MD;  Location: [facility];  Service: Orthopedics;  Laterality: Right;  VIDEO ASSISTED THORACOSCOPY (VATS)/DECORTICATION Left 06-29-2007  dr Arlyce Dice @[facility]    mini thoractomy and drainage left-sided empyema    There were no vitals filed for this visit.   Subjective Assessment - 09/12/20 1400     Subjective Patient states he has been doing his exercises daily. Patient states 50% improvement with PT intervention. He continues to remain limited by pain. Therapy has helped some but pain is still there but has been for a long.  Some days are better than others. He feels he can continue with HEP. Still limited in walking.    Patient Stated Goals walk more/less pain    Currently in Pain? Yes    Pain Score 2     Pain Location Back     Pain Orientation Lower    Pain Descriptors / Indicators Aching    Pain Type Chronic pain    Pain Onset More than a month ago    Pain Frequency Constant                [facility] PT Assessment - 09/12/20 0001       Assessment   Medical Diagnosis Unsteadiness on feet    Referring Provider (PT) Sueanne Margarita, DO    Next MD Visit Maybe September      Balance Screen   Has the patient fallen in the past 6 months Yes    How many times? 3-4   none recent     [place] residence    Type of [place] to enter      Prior Function   Level of Independence Independent    Vocation Retired      Art therapist   Posture/Postural Control Postural limitations    Postural Limitations Flexed trunk      Strength   Right Hip Flexion 5/5    Left Hip Flexion 5/5    Right Knee Flexion 5/5    Right Knee Extension 5/5    Left Knee Flexion 5/5    Left Knee Extension 5/5    Right Ankle Dorsiflexion 5/5    Left Ankle Dorsiflexion 5/5      Transfers   Five time sit to stand comments  11.98 seconds without UE use      Ambulation/Gait   Ambulation/Gait Yes    Ambulation/Gait Assistance 7: Independent    Ambulation Distance (Feet) 395 Feet    Gait Pattern Step-through pattern;Trunk flexed;Decreased stride length    Gait Comments 2MW      High Level Balance   High Level Balance Comments SLS: 2 sec left, 6 sec right                                    PT Education - 09/12/20 1400     Education Details HEP    Person(s) Educated Patient    Methods Explanation    Comprehension Verbalized understanding              PT Short Term Goals - 09/12/20 1407       PT SHORT TERM GOAL #1   Title Patient will be independent with HEP in order to improve functional outcomes.    Time 3    Period Weeks    Status Achieved    Target Date 08/27/20      PT SHORT TERM GOAL #2   Title Pt single  leg  stance to be at least 10 seconds to reduce risk of falling     Baseline 2-6 sec left/right    Time 3    Period Weeks    Status Not Met    Target Date 08/27/20      PT SHORT TERM GOAL #3   Title Demo improved BLE strength and balance as evidenced by 5xSTS test of 15 sec    Baseline --    Time 3    Period Weeks    Status Achieved    Target Date 08/27/20               PT Long Term Goals - 09/12/20 1407       PT LONG TERM GOAL #1   Title Patient will report at least 25% improvement in symptoms for improved quality of life.    Time 6    Period Weeks    Status Achieved      PT LONG TERM GOAL #2   Title Patient will be able to complete 5x STS in under 11.4 seconds in order to reduce the risk of falls.    Baseline --    Time 6    Period Weeks    Status Partially Met      PT LONG TERM GOAL #3   Title Demo improved gait velocity as evidenced by distance of 300 ft during 2MWT    Baseline --    Time 6    Period Weeks    Status Achieved      PT LONG TERM GOAL #4   Status --                   Plan - 09/12/20 1359     Clinical Impression Statement Patient has met 2/3 short term goals and 3/3 long term goals with ability to complete HEP and improvement in symptoms, strength, gait, and functional mobility. Patient continues to remain limited by back pain and impaired single leg balance. Patient has made great improvement in strength and functional mobility since beginning therapy. Reviewed HEP with patient and gave updated handouts. Patient educated on returning to PT if needed. Patient discharged from physical therapy at this time.    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/Moderate complexity    Rehab Potential  Fair    PT Frequency --    PT Duration --    PT Treatment/Interventions ADLs/Self Care Home Management;Aquatic Therapy;Gait training;Stair training;Functional mobility training;Therapeutic activities;Therapeutic exercise;Balance training;Patient/family education;Neuromuscular re-education;Manual techniques;Dry needling;Taping;Spinal Manipulations;Joint Manipulations    PT Next Visit Plan n/a    PT Home Exercise Plan ab set, walking with walking stick/trekking poles 8/9 seated HS stretch, LTR, bent knee raise; 8/24:  knee to chest, bridge and sit to stand    Consulted and Agree with Plan of Care Patient             Patient will benefit from skilled therapeutic intervention in order to improve the following deficits and impairments:  Abnormal gait, Decreased activity tolerance, Decreased balance, Decreased endurance, Decreased strength, Decreased range of motion, Difficulty walking, Postural dysfunction, Improper body mechanics, Pain  Visit Diagnosis: Difficulty in walking, not elsewhere classified  Unsteadiness on feet  Muscle weakness (generalized)     Problem List Patient Active Problem List   Diagnosis Date Noted   Primary localized osteoarthritis of right knee  02/26/2015   Fecal incontinence 09/24/2014   Precordial pain 01/18/2014   Adhesive capsulitis of shoulder 06/15/2013   Decreased range of motion of right shoulder 05/19/2013   Muscle weakness (generalized) 05/19/2013   Diabetic autonomic neuropathy ([place]) 05/20/2012   Other and unspecified hyperlipidemia 05/20/2012   Insomnia 05/20/2012   Essential hypertension, benign 05/20/2012    2:34 PM, 09/12/20 Mearl Latin PT, DPT Physical Therapist at [facility] [place], [place], [address] Phone: [phone]   Fax:  [phone]  Name: ABIEL ANTRIM MRN: [id number] Date of Birth: 1945-06-25

## 2020-09-17 ENCOUNTER — Encounter (HOSPITAL_COMMUNITY): Payer: Medicare Other

## 2020-09-19 ENCOUNTER — Encounter (HOSPITAL_COMMUNITY): Payer: Medicare Other | Admitting: Physical Therapy
